# Patient Record
Sex: Female | Born: 1935
Health system: Southern US, Community
[De-identification: ages and names within clinical notes are randomized; demographics above are authoritative.]

## PROBLEM LIST (undated history)

## (undated) DIAGNOSIS — N289 Disorder of kidney and ureter, unspecified: Secondary | ICD-10-CM

## (undated) DIAGNOSIS — I10 Essential (primary) hypertension: Secondary | ICD-10-CM

## (undated) DIAGNOSIS — M1990 Unspecified osteoarthritis, unspecified site: Secondary | ICD-10-CM

## (undated) DIAGNOSIS — E871 Hypo-osmolality and hyponatremia: Secondary | ICD-10-CM

## (undated) DIAGNOSIS — E222 Syndrome of inappropriate secretion of antidiuretic hormone: Secondary | ICD-10-CM

## (undated) DIAGNOSIS — K5792 Diverticulitis of intestine, part unspecified, without perforation or abscess without bleeding: Secondary | ICD-10-CM

## (undated) DIAGNOSIS — R1310 Dysphagia, unspecified: Secondary | ICD-10-CM

## (undated) DIAGNOSIS — C801 Malignant (primary) neoplasm, unspecified: Secondary | ICD-10-CM

## (undated) DIAGNOSIS — E039 Hypothyroidism, unspecified: Secondary | ICD-10-CM

## (undated) HISTORY — DX: Disorder of kidney and ureter, unspecified: N28.9

## (undated) HISTORY — DX: Essential (primary) hypertension: I10

## (undated) HISTORY — PX: EYE SURGERY: SHX253

## (undated) HISTORY — DX: Hypothyroidism, unspecified: E03.9

## (undated) HISTORY — PX: TONSILLECTOMY: SUR1361

## (undated) HISTORY — PX: REFRACTIVE SURGERY: SHX103

## (undated) HISTORY — DX: Hypo-osmolality and hyponatremia: E87.1

---

## 2007-12-02 ENCOUNTER — Ambulatory Visit: Payer: Self-pay | Admitting: Internal Medicine

## 2007-12-15 ENCOUNTER — Ambulatory Visit: Payer: Self-pay | Admitting: Internal Medicine

## 2007-12-27 ENCOUNTER — Ambulatory Visit: Payer: Self-pay | Admitting: Internal Medicine

## 2008-01-19 ENCOUNTER — Ambulatory Visit: Payer: Self-pay | Admitting: Internal Medicine

## 2008-03-30 ENCOUNTER — Ambulatory Visit: Payer: Self-pay | Admitting: Internal Medicine

## 2008-12-04 ENCOUNTER — Ambulatory Visit: Payer: Self-pay | Admitting: Internal Medicine

## 2009-01-01 ENCOUNTER — Ambulatory Visit: Payer: Self-pay | Admitting: Internal Medicine

## 2009-03-07 ENCOUNTER — Ambulatory Visit: Payer: Self-pay | Admitting: Internal Medicine

## 2010-01-16 ENCOUNTER — Ambulatory Visit: Payer: Self-pay | Admitting: Internal Medicine

## 2011-01-19 ENCOUNTER — Ambulatory Visit: Payer: Self-pay | Admitting: Internal Medicine

## 2011-09-01 ENCOUNTER — Other Ambulatory Visit: Payer: Self-pay | Admitting: Internal Medicine

## 2011-09-01 MED ORDER — LEVOTHYROXINE SODIUM 88 MCG PO TABS: 88.0000 ug | ORAL_TABLET | Freq: Every day | ORAL | Status: DC

## 2011-09-29 ENCOUNTER — Other Ambulatory Visit: Payer: Self-pay | Admitting: Internal Medicine

## 2011-09-29 MED ORDER — SIMVASTATIN 40 MG PO TABS: 40.0000 mg | ORAL_TABLET | Freq: Every day | ORAL | Status: DC

## 2011-10-19 ENCOUNTER — Other Ambulatory Visit: Payer: Self-pay | Admitting: *Deleted

## 2011-10-19 MED ORDER — LISINOPRIL 40 MG PO TABS: 40.0000 mg | ORAL_TABLET | Freq: Two times a day (BID) | ORAL | Status: DC

## 2011-12-30 ENCOUNTER — Other Ambulatory Visit: Payer: Self-pay | Admitting: *Deleted

## 2011-12-31 MED ORDER — LEVOTHYROXINE SODIUM 88 MCG PO TABS: 88.0000 ug | ORAL_TABLET | Freq: Every day | ORAL | Status: DC

## 2011-12-31 MED ORDER — LOSARTAN POTASSIUM 100 MG PO TABS: 100.0000 mg | ORAL_TABLET | Freq: Every day | ORAL | Status: DC

## 2012-01-15 ENCOUNTER — Ambulatory Visit: Payer: Self-pay | Admitting: Internal Medicine

## 2012-01-22 ENCOUNTER — Ambulatory Visit (INDEPENDENT_AMBULATORY_CARE_PROVIDER_SITE_OTHER): Payer: Medicare Other | Admitting: Internal Medicine

## 2012-01-22 ENCOUNTER — Encounter: Payer: Self-pay | Admitting: Internal Medicine

## 2012-01-22 DIAGNOSIS — E785 Hyperlipidemia, unspecified: Secondary | ICD-10-CM

## 2012-01-22 DIAGNOSIS — Z79899 Other long term (current) drug therapy: Secondary | ICD-10-CM | POA: Diagnosis not present

## 2012-01-22 DIAGNOSIS — I10 Essential (primary) hypertension: Secondary | ICD-10-CM | POA: Diagnosis not present

## 2012-01-22 DIAGNOSIS — E039 Hypothyroidism, unspecified: Secondary | ICD-10-CM | POA: Diagnosis not present

## 2012-01-22 DIAGNOSIS — R030 Elevated blood-pressure reading, without diagnosis of hypertension: Secondary | ICD-10-CM | POA: Insufficient documentation

## 2012-01-22 LAB — COMPREHENSIVE METABOLIC PANEL
ALT: 19 U/L (ref 0–35)
Albumin: 4.2 g/dL (ref 3.5–5.2)
CO2: 25 mEq/L (ref 19–32)
Calcium: 9.2 mg/dL (ref 8.4–10.5)
Chloride: 95 mEq/L — ABNORMAL LOW (ref 96–112)
GFR: 73.42 mL/min (ref >60.00)
Potassium: 4.3 mEq/L (ref 3.5–5.1)
Sodium: 130 mEq/L — ABNORMAL LOW (ref 135–145)
Total Protein: 7.7 g/dL (ref 6.0–8.3)

## 2012-01-22 LAB — LIPID PANEL
Total CHOL/HDL Ratio: 2
Triglycerides: 73 mg/dL (ref 0.0–149.0)

## 2012-01-22 NOTE — Patient Instructions (Addendum)
Increase your vitamin d 3 to 2000 units daily during the winter months   Finish your current niaspan bottle but then stop.  We will repeat in 6 months

## 2012-01-22 NOTE — Assessment & Plan Note (Signed)
Her TSH is 0.98 which is at goal on 88 mcg of Synthroid daily. No changes today.

## 2012-01-22 NOTE — Progress Notes (Signed)
Subjective:    Patient ID: Brittney Jackson, female    DOB: 1929-12-30, 78 y.o.   MRN: [id number]  HPI Brittney Jackson is a 78 year old white female with a history of hypertension, hyperlipidemia, hypothyroidisim and osteoporosis with no prior fractures who is here to followup on general medical issues she has had persistent left hip pain for nearly 8 months which she acquired after walking long distance during pregnancy graduation ceremony. Her hip was evaluated by orthopedics and she chose to follow up with an Brittney Jackson and after several  months of therapy has completely resolved her pain. She continues to go to [facility] for monthly followup. She has resumed attending Brittney Jackson's arthritis classy and has no recent history of falls or balance problems. She continues to check her blood pressure at home and her readings have been consistently 110-135 systolic range and her pulse is in the 70s to 80s.  Past Medical History  Diagnosis Date  . Osteoporosis   . Hypertension   . Hypothyroidism    Current Outpatient Prescriptions on File Prior to Visit  Medication Sig Dispense Refill  . levothyroxine (SYNTHROID) 88 MCG tablet Take 1 tablet (88 mcg total) by mouth daily.  30 tablet  3  . lisinopril (PRINIVIL,ZESTRIL) 40 MG tablet Take 1 tablet (40 mg total) by mouth 2 (two) times daily.  60 tablet  6  . losartan (COZAAR) 100 MG tablet Take 1 tablet (100 mg total) by mouth daily.  30 tablet  0  . simvastatin (ZOCOR) 40 MG tablet Take 1 tablet (40 mg total) by mouth at bedtime.  30 tablet  3   Review of Systems  Constitutional: Negative for fever, chills and unexpected weight change.  HENT: Negative for hearing loss, ear pain, nosebleeds, congestion, sore throat, facial swelling, rhinorrhea, sneezing, mouth sores, trouble swallowing, neck pain, neck stiffness, voice change, postnasal drip, sinus pressure, tinnitus and ear discharge.   Eyes: Negative for pain, discharge,  redness and visual disturbance.  Respiratory: Negative for cough, chest tightness, shortness of breath, wheezing and stridor.   Cardiovascular: Negative for chest pain, palpitations and leg swelling.  Musculoskeletal: Negative for myalgias and arthralgias.  Skin: Negative for color change and rash.  Neurological: Negative for dizziness, weakness, light-headedness and headaches.  Hematological: Negative for adenopathy.       Objective:   Physical Exam  Constitutional: She is oriented to person, place, and time. She appears well-developed and well-nourished.  HENT:  Mouth/Throat: Oropharynx is clear and moist.  Eyes: EOM are normal. Pupils are equal, round, and reactive to light. No scleral icterus.  Neck: Normal range of motion. Neck supple. No JVD present. No thyromegaly present.  Cardiovascular: Normal rate, regular rhythm, normal heart sounds and intact distal pulses.   Pulmonary/Chest: Effort normal and breath sounds normal.  Abdominal: Soft. Bowel sounds are normal. She exhibits no mass. There is no tenderness.  Musculoskeletal: Normal range of motion. She exhibits no edema.  Lymphadenopathy:    She has no cervical adenopathy.  Neurological: She is alert and oriented to person, place, and time.  Skin: Skin is warm and dry.  Psychiatric: She has a normal mood and affect.       Assessment & Plan:   Hypertension She has a history of whitecoat hypertension and has consistently elevated pressures in the office but consistently normal pressures at home. She is no evidence of end organ damage by prior testing of urine and kidney function will continue Cozaar and lisinopril  despite presence of a mild cough, given her history of intolerance to hydrochlorothiazide because of hyponatremia and leukopenia which caused fluid retention. She is due for repeat to be met.  Hypothyroidism Her TSH is 0.98 which is at goal on 88 mcg of Synthroid daily. No changes today.  Hyperlipidemia LDL goal <  100 Her LDL is well under 70 on current dose of this simvastatin. She's also taking Niaspan we've decided to stop the Niaspan and repeat her lipids in 6 months.    Updated Medication List Outpatient Encounter Prescriptions as of 01/22/2012  Medication Sig Dispense Refill  . aspirin 81 MG tablet Take 81 mg by mouth daily.      . cholecalciferol (VITAMIN D) 1000 UNITS tablet Take 1,000 Units by mouth daily.      Marland Kitchen levothyroxine (SYNTHROID) 88 MCG tablet Take 1 tablet (88 mcg total) by mouth daily.  30 tablet  3  . lisinopril (PRINIVIL,ZESTRIL) 40 MG tablet Take 1 tablet (40 mg total) by mouth 2 (two) times daily.  60 tablet  6  . losartan (COZAAR) 100 MG tablet Take 1 tablet (100 mg total) by mouth daily.  30 tablet  0  . niacin (NIASPAN) 500 MG CR tablet Take 500 mg by mouth at bedtime.      . simvastatin (ZOCOR) 40 MG tablet Take 1 tablet (40 mg total) by mouth at bedtime.  30 tablet  3  . Calcium-Vitamin D-Vitamin K (VIACTIV PO) One by mouth three times a day.

## 2012-01-22 NOTE — Assessment & Plan Note (Signed)
Her LDL is well under 70 on current dose of this simvastatin. She's also taking Niaspan we've decided to stop the Niaspan and repeat her lipids in 6 months.

## 2012-01-22 NOTE — Assessment & Plan Note (Signed)
She has a history of whitecoat hypertension and has consistently elevated pressures in the office but consistently normal pressures at home. She is no evidence of end organ damage by prior testing of urine and kidney function will continue Cozaar and lisinopril despite presence of a mild cough, given her history of intolerance to hydrochlorothiazide because of hyponatremia and leukopenia which caused fluid retention. She is due for repeat to be met.

## 2012-01-25 ENCOUNTER — Telehealth: Payer: Self-pay | Admitting: Internal Medicine

## 2012-01-25 NOTE — Telephone Encounter (Signed)
Patient is scheduled on March 14,2013 for mammogram @ 3:15 Bone DX @ 3:00. Faxed order to [phone].

## 2012-01-28 ENCOUNTER — Other Ambulatory Visit: Payer: Self-pay | Admitting: *Deleted

## 2012-01-28 MED ORDER — LOSARTAN POTASSIUM 100 MG PO TABS: 100.0000 mg | ORAL_TABLET | Freq: Every day | ORAL | Status: DC

## 2012-01-28 MED ORDER — SIMVASTATIN 40 MG PO TABS: 40.0000 mg | ORAL_TABLET | Freq: Every day | ORAL | Status: DC

## 2012-01-28 NOTE — Telephone Encounter (Signed)
Faxed request from [facility], last filled 12/28/11.

## 2012-03-03 ENCOUNTER — Encounter: Payer: Self-pay | Admitting: Internal Medicine

## 2012-04-20 ENCOUNTER — Ambulatory Visit: Payer: Self-pay | Admitting: Internal Medicine

## 2012-04-20 DIAGNOSIS — M899 Disorder of bone, unspecified: Secondary | ICD-10-CM | POA: Diagnosis not present

## 2012-04-20 DIAGNOSIS — Z87898 Personal history of other specified conditions: Secondary | ICD-10-CM | POA: Diagnosis not present

## 2012-04-20 DIAGNOSIS — Z1231 Encounter for screening mammogram for malignant neoplasm of breast: Secondary | ICD-10-CM | POA: Diagnosis not present

## 2012-04-20 DIAGNOSIS — Z79899 Other long term (current) drug therapy: Secondary | ICD-10-CM | POA: Diagnosis not present

## 2012-04-23 LAB — HM MAMMOGRAPHY: HM Mammogram: NORMAL

## 2012-04-23 LAB — HM DEXA SCAN

## 2012-04-25 DIAGNOSIS — M169 Osteoarthritis of hip, unspecified: Secondary | ICD-10-CM | POA: Diagnosis not present

## 2012-04-25 DIAGNOSIS — M1710 Unilateral primary osteoarthritis, unspecified knee: Secondary | ICD-10-CM | POA: Diagnosis not present

## 2012-04-25 DIAGNOSIS — M545 Low back pain: Secondary | ICD-10-CM | POA: Diagnosis not present

## 2012-04-26 ENCOUNTER — Telehealth: Payer: Self-pay | Admitting: Internal Medicine

## 2012-04-26 DIAGNOSIS — M8580 Other specified disorders of bone density and structure, unspecified site: Secondary | ICD-10-CM | POA: Insufficient documentation

## 2012-04-26 MED ORDER — LEVOTHYROXINE SODIUM 88 MCG PO TABS: 88.0000 ug | ORAL_TABLET | Freq: Every day | ORAL | Status: DC

## 2012-04-26 MED ORDER — LISINOPRIL 40 MG PO TABS: 40.0000 mg | ORAL_TABLET | Freq: Two times a day (BID) | ORAL | Status: DC

## 2012-04-26 NOTE — Telephone Encounter (Signed)
Bone density scores have remained the same or in some places improved, since 2008.

## 2012-04-26 NOTE — Telephone Encounter (Signed)
Patient notified

## 2012-05-04 ENCOUNTER — Encounter: Payer: Self-pay | Admitting: Internal Medicine

## 2012-05-06 ENCOUNTER — Encounter: Payer: Self-pay | Admitting: Internal Medicine

## 2012-07-21 ENCOUNTER — Ambulatory Visit (INDEPENDENT_AMBULATORY_CARE_PROVIDER_SITE_OTHER): Payer: Medicare Other | Admitting: Internal Medicine

## 2012-07-21 ENCOUNTER — Encounter: Payer: Self-pay | Admitting: Internal Medicine

## 2012-07-21 VITALS — BP 180/80 | HR 84 | Temp 98.4°F | Resp 16 | Wt 145.5 lb

## 2012-07-21 DIAGNOSIS — E039 Hypothyroidism, unspecified: Secondary | ICD-10-CM | POA: Diagnosis not present

## 2012-07-21 DIAGNOSIS — M899 Disorder of bone, unspecified: Secondary | ICD-10-CM | POA: Diagnosis not present

## 2012-07-21 DIAGNOSIS — Z79899 Other long term (current) drug therapy: Secondary | ICD-10-CM | POA: Diagnosis not present

## 2012-07-21 DIAGNOSIS — I10 Essential (primary) hypertension: Secondary | ICD-10-CM | POA: Diagnosis not present

## 2012-07-21 DIAGNOSIS — M8580 Other specified disorders of bone density and structure, unspecified site: Secondary | ICD-10-CM

## 2012-07-21 DIAGNOSIS — E871 Hypo-osmolality and hyponatremia: Secondary | ICD-10-CM

## 2012-07-21 LAB — COMPREHENSIVE METABOLIC PANEL
Alkaline Phosphatase: 131 U/L — ABNORMAL HIGH (ref 39–117)
BUN: 13 mg/dL (ref 6–23)
CO2: 25 mEq/L (ref 19–32)
GFR: 65.69 mL/min (ref >60.00)
Glucose, Bld: 105 mg/dL — ABNORMAL HIGH (ref 70–99)
Sodium: 130 mEq/L — ABNORMAL LOW (ref 135–145)
Total Bilirubin: 0.6 mg/dL (ref 0.3–1.2)
Total Protein: 7.3 g/dL (ref 6.0–8.3)

## 2012-07-21 NOTE — Assessment & Plan Note (Addendum)
Stable, by repeat 2013 DEXA compared to 2008 with T scores -2.3 worst at hips.  She does not want therapy.,  Taking calcium and vitamin d

## 2012-07-21 NOTE — Progress Notes (Signed)
Patient ID: Brittney Jackson, female   DOB: 1930/09/28, 78 y.o.   MRN: [id number] Patient Active Problem List  Diagnosis  . Hypertension  . Hypothyroidism  . Hyperlipidemia LDL goal < 100  . Osteopenia    Subjective:  CC:   Chief Complaint  Patient presents with  . Follow-up    6 month    HPI:   Brittney Jackson a 78 y.o. female who presents for follow up on chronic issues of hypertension and osteoarthritis.  She has been having increased left knee and left SI joint pain which was treated recently with cortisone injection in the knee by Pilar Jarvis at [facility] in April with good results. She is walking for exercise. Not taking any medications for pain. Decided at addition of a pressure her home readings are excellent systolics have been ranging from 120-135.   She continues to have 15 minutes of r unny nose every morning after taking her meds. She has a Mild cough secondary to lisinopril but doesn't want to change to other meds. Because of multiple intolerances.  She has done recently that use of ibuprofen elevates her blood pressure  She is using tylenol for good pain management.    Past Medical History  Diagnosis Date  . Osteoporosis   . Hypertension   . Hypothyroidism     History reviewed. No pertinent past surgical history.  The following portions of the patient's history were reviewed and updated as appropriate: Allergies, current medications, and problem list.    Review of Systems:   Review of Systems  Constitutional: Negative.   HENT: Negative.   Eyes: Negative.   Cardiovascular: Negative.   Gastrointestinal: Negative.   Genitourinary: Negative.   Musculoskeletal: Positive for joint pain.  Skin: Negative.   Neurological: Negative.   Endo/Heme/Allergies: Negative.   Psychiatric/Behavioral: Negative.        History   Social History  . Marital Status: Widowed    Spouse Name: N/A    Number of Children: N/A  . Years of Education: N/A   Occupational  History  . 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 Smoking status: Former Smoker    Types: Cigarettes    Quit date: 07/21/2000  . Smokeless tobacco: Never Used  . Alcohol Use: No  . Drug Use: No  . Sexually Active: Not on file   Other Topics Concern  . Not on file   Social History Narrative   Church activities, community service.Lives alone.    Objective:  BP 180/80  Pulse 84  Temp 98.4 F (36.9 C) (Oral)  Resp 16  Wt 145 lb 8 oz (65.998 kg)  SpO2 95%  General appearance: alert, cooperative and appears stated age Ears: normal TM's and external ear canals both ears Throat: lips, mucosa, and tongue normal; teeth and gums normal Neck: no adenopathy, no carotid bruit, supple, symmetrical, trachea midline and thyroid not enlarged, symmetric, no tenderness/mass/nodules Back: symmetric, no curvature. ROM normal. No CVA tenderness. Lungs: clear to auscultation bilaterally Heart: regular rate and rhythm, S1, S2 normal, no murmur, click, rub or gallop Abdomen: soft, non-tender; bowel sounds normal; no masses,  no organomegaly Pulses: 2+ and symmetric Skin: Skin color, texture, turgor normal. No rashes or lesions Lymph nodes: Cervical, supraclavicular, and axillary nodes normal.  Assessment and Plan:  Osteopenia Stable, by repeat 2013 DEXA compared to 2008 with T scores -2.3 worst at hips.  She does not want therapy.,  Taking calcium and vitamin  d   Hypertension Well controlled on current regimen. Renal function stable, no changes today.  Hypothyroidism Well controlled on current medications.  No changes today.  Hyponatremia Chronic, stable, asymptomatic.  Avoiding hctz ans this aggravated it in the past.    Updated Medication List Outpatient Encounter Prescriptions as of 07/21/2012  Medication Sig Dispense Refill  . aspirin 81 MG tablet Take 81 mg by mouth daily.      . cholecalciferol (VITAMIN D) 1000 UNITS tablet Take  1,000 Units by mouth daily.      Marland Kitchen levothyroxine (SYNTHROID) 88 MCG tablet Take 1 tablet (88 mcg total) by mouth daily.  30 tablet  3  . lisinopril (PRINIVIL,ZESTRIL) 40 MG tablet Take 1 tablet (40 mg total) by mouth 2 (two) times daily.  60 tablet  6  . losartan (COZAAR) 100 MG tablet Take 1 tablet (100 mg total) by mouth daily.  30 tablet  11  . simvastatin (ZOCOR) 40 MG tablet Take 1 tablet (40 mg total) by mouth at bedtime.  30 tablet  6  . DISCONTD: Calcium-Vitamin D-Vitamin K (VIACTIV PO) One by mouth three times a day.      Marland Kitchen DISCONTD: niacin (NIASPAN) 500 MG CR tablet Take 500 mg by mouth at bedtime.

## 2012-07-22 ENCOUNTER — Encounter: Payer: Self-pay | Admitting: Internal Medicine

## 2012-07-23 ENCOUNTER — Encounter: Payer: Self-pay | Admitting: Internal Medicine

## 2012-07-23 DIAGNOSIS — E871 Hypo-osmolality and hyponatremia: Secondary | ICD-10-CM | POA: Insufficient documentation

## 2012-07-23 NOTE — Assessment & Plan Note (Signed)
Chronic, stable, asymptomatic.  Avoiding hctz ans this aggravated it in the past.

## 2012-07-23 NOTE — Assessment & Plan Note (Signed)
Well controlled on current medications.  No changes today. 

## 2012-07-23 NOTE — Assessment & Plan Note (Signed)
Well controlled on current regimen. Renal function stable, no changes today. 

## 2012-08-24 ENCOUNTER — Other Ambulatory Visit: Payer: Self-pay | Admitting: Internal Medicine

## 2012-08-24 MED ORDER — SIMVASTATIN 40 MG PO TABS: 40.0000 mg | ORAL_TABLET | Freq: Every day | ORAL | Status: DC

## 2012-08-24 MED ORDER — LEVOTHYROXINE SODIUM 88 MCG PO TABS: 88.0000 ug | ORAL_TABLET | Freq: Every day | ORAL | Status: DC

## 2012-08-25 DIAGNOSIS — H25099 Other age-related incipient cataract, unspecified eye: Secondary | ICD-10-CM | POA: Diagnosis not present

## 2013-01-24 ENCOUNTER — Encounter: Payer: Self-pay | Admitting: Internal Medicine

## 2013-01-24 ENCOUNTER — Ambulatory Visit (INDEPENDENT_AMBULATORY_CARE_PROVIDER_SITE_OTHER): Payer: Medicare Other | Admitting: Internal Medicine

## 2013-01-24 VITALS — BP 150/80 | HR 91 | Temp 98.0°F | Resp 16 | Ht 60.0 in | Wt 144.2 lb

## 2013-01-24 DIAGNOSIS — E871 Hypo-osmolality and hyponatremia: Secondary | ICD-10-CM

## 2013-01-24 DIAGNOSIS — Z23 Encounter for immunization: Secondary | ICD-10-CM

## 2013-01-24 DIAGNOSIS — E785 Hyperlipidemia, unspecified: Secondary | ICD-10-CM

## 2013-01-24 DIAGNOSIS — I10 Essential (primary) hypertension: Secondary | ICD-10-CM | POA: Diagnosis not present

## 2013-01-24 DIAGNOSIS — E039 Hypothyroidism, unspecified: Secondary | ICD-10-CM

## 2013-01-24 DIAGNOSIS — M949 Disorder of cartilage, unspecified: Secondary | ICD-10-CM

## 2013-01-24 DIAGNOSIS — Z79899 Other long term (current) drug therapy: Secondary | ICD-10-CM | POA: Diagnosis not present

## 2013-01-24 DIAGNOSIS — Z0000 Encounter for general adult medical examination without abnormal findings: Secondary | ICD-10-CM

## 2013-01-24 DIAGNOSIS — M899 Disorder of bone, unspecified: Secondary | ICD-10-CM | POA: Diagnosis not present

## 2013-01-24 DIAGNOSIS — M8580 Other specified disorders of bone density and structure, unspecified site: Secondary | ICD-10-CM

## 2013-01-24 LAB — COMPREHENSIVE METABOLIC PANEL
ALT: 20 U/L (ref 0–35)
AST: 22 U/L (ref 0–37)
Albumin: 4.1 g/dL (ref 3.5–5.2)
Alkaline Phosphatase: 126 U/L — ABNORMAL HIGH (ref 39–117)
BUN: 22 mg/dL (ref 6–23)
Creatinine, Ser: 1 mg/dL (ref 0.4–1.2)
Potassium: 3.6 mEq/L (ref 3.5–5.1)

## 2013-01-24 LAB — TSH: TSH: 0.79 u[IU]/mL (ref 0.35–5.50)

## 2013-01-24 MED ORDER — TETANUS-DIPHTH-ACELL PERTUSSIS 5-2.5-18.5 LF-MCG/0.5 IM SUSP: 0.5000 mL | Freq: Once | INTRAMUSCULAR | Status: DC

## 2013-01-24 MED ORDER — PNEUMOCOCCAL VAC POLYVALENT 25 MCG/0.5ML IJ INJ: 0.5000 mL | INJECTION | Freq: Once | INTRAMUSCULAR | Status: DC

## 2013-01-24 MED ORDER — ZOSTER VACCINE LIVE 19400 UNT/0.65ML ~~LOC~~ SOLR: 0.6500 mL | Freq: Once | SUBCUTANEOUS | Status: DC

## 2013-01-24 NOTE — Assessment & Plan Note (Signed)
Comp exam with breast exam done. Pelvic deferred as it was done last year .

## 2013-01-24 NOTE — Assessment & Plan Note (Addendum)
t scores stable  Since 2008 by April 2013 DEXA.Marland Kitchen  Discussed and reviewed today. Does not want meds.  Had a prior trial of alendronate.  Physically activity and takes classes for mobility and strength .

## 2013-01-24 NOTE — Progress Notes (Signed)
Patient ID: Brittney Jackson, female   DOB: Jun 10, 1930, 79 y.o.   MRN: [id number]  The patient is here for annual Medicare wellness examination and management of other chronic and acute problems. ollow up on chronic issues of hypertension and osteo arthritis.  She is walking for exercise. Not taking any medications for pain. Blood pressure home readings are excellent systolics have been ranging from 120-135.   She continues to have 15 minutes of runny nose every morning after taking her meds. She has a mild cough secondary to lisinopril but doesn't want to change to other meds. Because of multiple intolerances.  She made the observation that use of ibuprofen elevates her blood pressure and is using tylenol for good pain management.     The risk factors are reflected in the social history.  The roster of all physicians providing medical care to patient - is listed in the Snapshot section of the chart.  Activities of daily living:  The patient is 100% independent in all ADLs: dressing, toileting, feeding as well as independent mobility  Home safety : The patient has smoke detectors in the home. They wear seatbelts.  There are no firearms at home. There is no violence in the home.   There is no risks for hepatitis, STDs or HIV. There is no   history of blood transfusion. They have no travel history to infectious disease endemic areas of the world.  The patient has seen their dentist in the last six month. They have seen their eye doctor in the last year. They admit to slight hearing difficulty with regard to whispered voices and some television programs.  They have deferred audiologic testing in the last year.  They do not  have excessive sun exposure. Discussed the need for sun protection: hats, long sleeves and use of sunscreen if there is significant sun exposure.   Diet: the importance of a healthy diet is discussed. They do have a healthy diet.  The benefits of regular aerobic exercise were  discussed. She walks 5 times per week  On a treadmill ,  20 minutes.  Sees acupuncturist ,  Dr Clarice Pole for treatment of  left knee .  Last Fall was Feb 2013 when knee gave out.   Depression screen: there are no signs or vegetative symptoms of depression- irritability, change in appetite, anhedonia, sadness/tearfullness. Has upsetting anxiety dreams/nightmares a few nights per week.   Cognitive assessment: the patient manages all their financial and personal affairs and is actively engaged. They could relate day,date,year and events; recalled 2/3 objects at 3 minutes; performed clock-face test normally.  The following portions of the patient's history were reviewed and updated as appropriate: allergies, current medications, past family history, past medical history,  past surgical history, past social history  and problem list.  Visual acuity was not assessed per patient preference since she has regular follow up with her ophthalmologist. Hearing and body mass index were assessed and reviewed.   During the course of the visit the patient was educated and counseled about appropriate screening and preventive services including : fall prevention , diabetes screening, nutrition counseling, colorectal cancer screening, and recommended immunizations.    BP 150/80  Pulse 91  Temp 98 F (36.7 C) (Oral)  Resp 16  Ht 5' (1.524 m)  Wt 144 lb 4 oz (65.431 kg)  BMI 28.17 kg/m2  SpO2 96%  General Appearance:    Alert, cooperative, no distress, appears stated age  Head:    Normocephalic, without obvious abnormality,  atraumatic  Eyes:    PERRL, conjunctiva/corneas clear, EOM's intact, fundi    benign, both eyes  Ears:    Normal TM's and external ear canals, both ears  Nose:   Nares normal, septum midline, mucosa normal, no drainage    or sinus tenderness  Throat:   Lips, mucosa, and tongue normal; teeth and gums normal  Neck:   Supple, symmetrical, trachea midline, no adenopathy;    thyroid:  no  enlargement/tenderness/nodules; no carotid   bruit or JVD  Back:     Symmetric, no curvature, ROM normal, no CVA tenderness  Lungs:     Clear to auscultation bilaterally, respirations unlabored  Chest Wall:    No tenderness or deformity   Heart:    Regular rate and rhythm, S1 and S2 normal, no murmur, rub   or gallop  Breast Exam:    No tenderness, masses, or nipple abnormality  Abdomen:     Soft, non-tender, bowel sounds active all four quadrants,    no masses, no organomegaly     Extremities:   Extremities normal, atraumatic, no cyanosis or edema  Pulses:   2+ and symmetric all extremities  Skin:   Skin color, texture, turgor normal, no rashes or lesions  Lymph nodes:   Cervical, supraclavicular, and axillary nodes normal  Neurologic:   CNII-XII intact, normal strength, sensation and reflexes    throughout   Assessment and Plan  Osteopenia t scores stable  Since 2008 by April 2013 DEXA.Marland Kitchen  Discussed and reviewed today. Does not want meds.  Had a prior trial of alendronate.  Physically activity and takes classes for mobility and strength .    Hypertension Well controlled on current medications.  No changes today. Home systolics have been 130/70  Routine general medical examination at a health care facility Comp exam with breast exam done. Pelvic deferred as it was done last year .  Hyponatremia Stable, asymptomatic.   Hypothyroidism Thyroid function is normal on current dose. No changes today   Hyperlipidemia LDL goal < 100 LDL is at goal on current medications.  LFTS are normal. Repeat both in 6 months    Updated Medication List Outpatient Encounter Prescriptions as of 01/24/2013  Medication Sig Dispense Refill  . aspirin 81 MG tablet Take 81 mg by mouth daily.      . cholecalciferol (VITAMIN D) 1000 UNITS tablet Take 1,000 Units by mouth daily.      Marland Kitchen levothyroxine (SYNTHROID) 88 MCG tablet Take 1 tablet (88 mcg total) by mouth daily.  90 tablet  3  . lisinopril  (PRINIVIL,ZESTRIL) 40 MG tablet Take 1 tablet (40 mg total) by mouth 2 (two) times daily.  60 tablet  6  . losartan (COZAAR) 100 MG tablet Take 1 tablet (100 mg total) by mouth daily.  30 tablet  11  . simvastatin (ZOCOR) 40 MG tablet Take 1 tablet (40 mg total) by mouth at bedtime.  90 tablet  3  . TDaP (BOOSTRIX) 5-2.5-18.5 LF-MCG/0.5 injection Inject 0.5 mLs into the muscle once.  0.5 mL  0  . zoster vaccine live, PF, (ZOSTAVAX) 16109 UNT/0.65ML injection Inject 19,400 Units into the skin once.  1 vial  0   Facility-Administered Encounter Medications as of 01/24/2013  Medication Dose Route Frequency Provider Last Rate Last Dose  . pneumococcal 23 valent vaccine (PNU-IMMUNE) injection 0.5 mL  0.5 mL Intramuscular Once Sherlene Shams, MD

## 2013-01-24 NOTE — Assessment & Plan Note (Addendum)
Well controlled on current medications.  No changes today. Home systolics have been 130/70

## 2013-01-24 NOTE — Patient Instructions (Signed)
I recommend that you get the Pneumonia vaccine and the TDaP vaccine  Consider getting the Shingles vaccine   Return in 6 months  You are due for mammogram in April

## 2013-01-25 NOTE — Assessment & Plan Note (Signed)
Stable, asymptomatic. 

## 2013-01-25 NOTE — Assessment & Plan Note (Signed)
LDL is at goal on current medications.  LFTS are normal. Repeat both in 6 months

## 2013-01-25 NOTE — Assessment & Plan Note (Signed)
Thyroid function is normal on current dose. No changes today  

## 2013-02-16 ENCOUNTER — Telehealth: Payer: Self-pay | Admitting: *Deleted

## 2013-02-16 MED ORDER — LOSARTAN POTASSIUM 100 MG PO TABS: 100.0000 mg | ORAL_TABLET | Freq: Every day | ORAL | Status: DC

## 2013-02-16 NOTE — Telephone Encounter (Signed)
Med filled.  

## 2013-02-16 NOTE — Telephone Encounter (Signed)
Refill Request  Losartan Potassium 100 mg tab  #30  Take 1 tablet each day

## 2013-03-10 ENCOUNTER — Other Ambulatory Visit: Payer: Self-pay | Admitting: *Deleted

## 2013-03-10 MED ORDER — LISINOPRIL 40 MG PO TABS: 40.0000 mg | ORAL_TABLET | Freq: Two times a day (BID) | ORAL | Status: DC

## 2013-03-10 NOTE — Telephone Encounter (Signed)
Med filled.  

## 2013-04-28 LAB — HM MAMMOGRAPHY: HM Mammogram: NORMAL

## 2013-05-01 ENCOUNTER — Ambulatory Visit: Payer: Self-pay | Admitting: Internal Medicine

## 2013-05-01 DIAGNOSIS — R920 Mammographic microcalcification found on diagnostic imaging of breast: Secondary | ICD-10-CM | POA: Diagnosis not present

## 2013-05-01 DIAGNOSIS — Z1231 Encounter for screening mammogram for malignant neoplasm of breast: Secondary | ICD-10-CM | POA: Diagnosis not present

## 2013-05-24 ENCOUNTER — Encounter: Payer: Self-pay | Admitting: Internal Medicine

## 2013-07-24 ENCOUNTER — Encounter: Payer: Self-pay | Admitting: Internal Medicine

## 2013-07-24 ENCOUNTER — Ambulatory Visit (INDEPENDENT_AMBULATORY_CARE_PROVIDER_SITE_OTHER): Payer: Medicare Other | Admitting: Internal Medicine

## 2013-07-24 VITALS — BP 158/78 | HR 93 | Temp 98.3°F | Resp 14 | Wt 143.5 lb

## 2013-07-24 DIAGNOSIS — E039 Hypothyroidism, unspecified: Secondary | ICD-10-CM

## 2013-07-24 DIAGNOSIS — M899 Disorder of bone, unspecified: Secondary | ICD-10-CM | POA: Diagnosis not present

## 2013-07-24 DIAGNOSIS — I10 Essential (primary) hypertension: Secondary | ICD-10-CM | POA: Diagnosis not present

## 2013-07-24 DIAGNOSIS — Z79899 Other long term (current) drug therapy: Secondary | ICD-10-CM | POA: Diagnosis not present

## 2013-07-24 DIAGNOSIS — E785 Hyperlipidemia, unspecified: Secondary | ICD-10-CM | POA: Diagnosis not present

## 2013-07-24 DIAGNOSIS — M949 Disorder of cartilage, unspecified: Secondary | ICD-10-CM | POA: Diagnosis not present

## 2013-07-24 DIAGNOSIS — M8580 Other specified disorders of bone density and structure, unspecified site: Secondary | ICD-10-CM

## 2013-07-24 NOTE — Progress Notes (Addendum)
Patient ID: Brittney Jackson, female   DOB: [birth year], 79 y.o.   MRN: [id number]   Patient Active Problem List   Diagnosis Date Noted  . Routine general medical examination at a health care facility 01/24/2013  . Hyponatremia 07/23/2012  . Osteopenia 04/26/2012  . Hypertension 01/22/2012  . Hypothyroidism 01/22/2012  . Hyperlipidemia LDL goal < 100 01/22/2012    Subjective:  CC:   Chief Complaint  Patient presents with  . Follow-up    6 month follow up    HPI:   Brittney Jackson a 79 y.o. female who presents for  6 month follow  Up on hypertension.  Patient continues to check her blood pressure to 3 times per week at home. She reports that her home bps have been well controlled,Ranging  from 127 to to 135 systolic with diastolics in the range of r 60 to 70. She is continuing to report a dry hacking cough with use of lisinopril but does not want to change her medications since she has a history of intolerances to other types.  She is up-to-date on Eye exams which have been negative for glaucoma and cataracts    Has not had the T DAP and Shingles,  Despite being counselled on it.  She has no contact with small children and does not do any yard work, and has no intention of getting it either vaccine at this time.   Past Medical History  Diagnosis Date  . Osteoporosis   . Hypertension   . Hypothyroidism     History reviewed. No pertinent past surgical history.  . pneumococcal 23 valent vaccine  0.5 mL Intramuscular Once     The following portions of the patient's history were reviewed and updated as appropriate: Allergies, current medications, and problem list.    Review of Systems:   Patient denies headache, fevers, malaise, unintentional weight loss, skin rash, eye pain, sinus congestion and sinus pain, sore throat, dysphagia,  hemoptysis , dyspnea, wheezing, chest pain, palpitations, orthopnea, edema, abdominal pain, nausea, melena, diarrhea, constipation, flank pain,  dysuria, hematuria, urinary  Frequency, nocturia, numbness, tingling, seizures,  Focal weakness, Loss of consciousness,  Tremor, insomnia, depression, anxiety, and suicidal ideation.     History   Social History  . Marital Status: Widowed    Spouse Name: N/A    Number of Children: N/A  . Years of Education: N/A   Occupational History  . 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 Smoking status: Former Smoker    Types: Cigarettes    Quit date: 07/21/2000  . Smokeless tobacco: Never Used  . Alcohol Use: No  . Drug Use: No  . Sexually Active: Not on file   Other Topics Concern  . Not on file   Social History Narrative   Church activities, community service.   Lives alone.    Objective:  BP 158/78  Pulse 93  Temp(Src) 98.3 F (36.8 C) (Oral)  Resp 14  Wt 143 lb 8 oz (65.091 kg)  BMI 28.03 kg/m2  SpO2 96%  General appearance: alert, cooperative and appears stated age Ears: normal TM's and external ear canals both ears Throat: lips, mucosa, and tongue normal; teeth and gums normal Neck: no adenopathy, no carotid bruit, supple, symmetrical, trachea midline and thyroid not enlarged, symmetric, no tenderness/mass/nodules Back: symmetric, no curvature. ROM normal. No CVA tenderness. Lungs: clear to auscultation bilaterally Heart: regular rate and rhythm, S1, S2 normal, no murmur, click, rub  or gallop Abdomen: soft, non-tender; bowel sounds normal; no masses,  no organomegaly Pulses: 2+ and symmetric Skin: Skin color, texture, turgor normal. No rashes or lesions Lymph nodes: Cervical, supraclavicular, and axillary nodes normal.  Assessment and Plan:  Osteopenia Taking vitamin d and exercising,  Not taking calcium  , took viactiv but did not like it.   Hypertension Well controlled on current regimen. Renal function needs checking and she has requested to have this done at [facility] due to a perceived lower  charge.  Hyperlipidemia LDL goal < 100 LDL is at goal on current medications.  LFTS are due. Repeat both in 6 months   Hypothyroidism Well-controlled on current dose of Synthroid.   Updated Medication List Outpatient Encounter Prescriptions as of 07/24/2013  Medication Sig Dispense Refill  . aspirin 81 MG tablet Take 81 mg by mouth daily.      . cholecalciferol (VITAMIN D) 1000 UNITS tablet Take 1,000 Units by mouth daily.      Marland Kitchen levothyroxine (SYNTHROID) 88 MCG tablet Take 1 tablet (88 mcg total) by mouth daily.  90 tablet  3  . lisinopril (PRINIVIL,ZESTRIL) 40 MG tablet Take 1 tablet (40 mg total) by mouth 2 (two) times daily.  60 tablet  6  . losartan (COZAAR) 100 MG tablet Take 1 tablet (100 mg total) by mouth daily.  90 tablet  2  . simvastatin (ZOCOR) 40 MG tablet Take 1 tablet (40 mg total) by mouth at bedtime.  90 tablet  3  . TDaP (BOOSTRIX) 5-2.5-18.5 LF-MCG/0.5 injection Inject 0.5 mLs into the muscle once.  0.5 mL  0  . zoster vaccine live, PF, (ZOSTAVAX) 62130 UNT/0.65ML injection Inject 19,400 Units into the skin once.  1 vial  0   Facility-Administered Encounter Medications as of 07/24/2013  Medication Dose Route Frequency Provider Last Rate Last Dose  . pneumococcal 23 valent vaccine (PNU-IMMUNE) injection 0.5 mL  0.5 mL Intramuscular Once Sherlene Shams, MD         No orders of the defined types were placed in this encounter.    No Follow-up on file.

## 2013-07-24 NOTE — Assessment & Plan Note (Signed)
Taking vitamin d and exercising,  Not taking calcium  , took viactiv but did not like it.

## 2013-07-24 NOTE — Patient Instructions (Addendum)
I recommend taking  One calcium supplement daily for your bones,    We will refill your medications  Once I see your labs.    Please get your liver and kidney function checked at [facility]   If the cough really gets to you,  We can switch to amlodipine and prn lasix

## 2013-07-25 ENCOUNTER — Encounter: Payer: Self-pay | Admitting: Internal Medicine

## 2013-07-25 NOTE — Assessment & Plan Note (Signed)
Well controlled on current dose of Synthroid.  

## 2013-07-25 NOTE — Assessment & Plan Note (Signed)
LDL is at goal on current medications.  LFTS are due. Repeat both in 6 months

## 2013-07-25 NOTE — Assessment & Plan Note (Signed)
Well controlled on current regimen. Renal function needs checking and she has requested to have this done at [facility] due to a perceived lower charge.

## 2013-07-26 ENCOUNTER — Telehealth: Payer: Self-pay | Admitting: Internal Medicine

## 2013-07-26 NOTE — Telephone Encounter (Signed)
All labs normal, contineu current medications

## 2013-07-27 ENCOUNTER — Encounter: Payer: Self-pay | Admitting: *Deleted

## 2013-07-27 NOTE — Telephone Encounter (Signed)
Letter mailed to patient.

## 2013-07-31 ENCOUNTER — Encounter: Payer: Self-pay | Admitting: Internal Medicine

## 2013-08-16 ENCOUNTER — Other Ambulatory Visit: Payer: Self-pay | Admitting: Internal Medicine

## 2013-09-09 DIAGNOSIS — H109 Unspecified conjunctivitis: Secondary | ICD-10-CM | POA: Diagnosis not present

## 2013-09-09 DIAGNOSIS — H00019 Hordeolum externum unspecified eye, unspecified eyelid: Secondary | ICD-10-CM | POA: Diagnosis not present

## 2013-09-12 DIAGNOSIS — H2510 Age-related nuclear cataract, unspecified eye: Secondary | ICD-10-CM | POA: Diagnosis not present

## 2013-10-18 DIAGNOSIS — Z23 Encounter for immunization: Secondary | ICD-10-CM | POA: Diagnosis not present

## 2013-11-15 ENCOUNTER — Other Ambulatory Visit: Payer: Self-pay | Admitting: Internal Medicine

## 2013-12-27 DIAGNOSIS — I10 Essential (primary) hypertension: Secondary | ICD-10-CM | POA: Diagnosis not present

## 2013-12-27 DIAGNOSIS — J209 Acute bronchitis, unspecified: Secondary | ICD-10-CM | POA: Diagnosis not present

## 2013-12-27 LAB — CBC
HGB: 14 g/dL (ref 12.0–16.0)
MCH: 28.8 pg (ref 26.0–34.0)
MCHC: 34.7 g/dL (ref 32.0–36.0)
Platelet: 258 10*3/uL (ref 150–440)

## 2013-12-27 LAB — COMPREHENSIVE METABOLIC PANEL
Albumin: 3.6 g/dL (ref 3.4–5.0)
Bilirubin,Total: 0.4 mg/dL (ref 0.2–1.0)
Calcium, Total: 8.5 mg/dL (ref 8.5–10.1)
Chloride: 87 mmol/L — ABNORMAL LOW (ref 98–107)
Creatinine: 0.68 mg/dL (ref 0.60–1.30)
EGFR (African American): >60
EGFR (Non-African Amer.): >60
Osmolality: 239 (ref 275–301)
SGOT(AST): 35 U/L (ref 15–37)
SGPT (ALT): 28 U/L (ref 12–78)
Sodium: 118 mmol/L — CL (ref 136–145)

## 2013-12-28 ENCOUNTER — Inpatient Hospital Stay: Payer: Self-pay | Admitting: Family Medicine

## 2013-12-28 DIAGNOSIS — J440 Chronic obstructive pulmonary disease with acute lower respiratory infection: Secondary | ICD-10-CM | POA: Diagnosis present

## 2013-12-28 DIAGNOSIS — L89109 Pressure ulcer of unspecified part of back, unspecified stage: Secondary | ICD-10-CM | POA: Diagnosis not present

## 2013-12-28 DIAGNOSIS — R5381 Other malaise: Secondary | ICD-10-CM | POA: Diagnosis present

## 2013-12-28 DIAGNOSIS — R918 Other nonspecific abnormal finding of lung field: Secondary | ICD-10-CM | POA: Diagnosis not present

## 2013-12-28 DIAGNOSIS — L8992 Pressure ulcer of unspecified site, stage 2: Secondary | ICD-10-CM | POA: Diagnosis present

## 2013-12-28 DIAGNOSIS — Z6827 Body mass index (BMI) 27.0-27.9, adult: Secondary | ICD-10-CM | POA: Diagnosis not present

## 2013-12-28 DIAGNOSIS — I10 Essential (primary) hypertension: Secondary | ICD-10-CM | POA: Diagnosis not present

## 2013-12-28 DIAGNOSIS — E559 Vitamin D deficiency, unspecified: Secondary | ICD-10-CM | POA: Diagnosis present

## 2013-12-28 DIAGNOSIS — E785 Hyperlipidemia, unspecified: Secondary | ICD-10-CM | POA: Diagnosis present

## 2013-12-28 DIAGNOSIS — E039 Hypothyroidism, unspecified: Secondary | ICD-10-CM | POA: Diagnosis not present

## 2013-12-28 DIAGNOSIS — R05 Cough: Secondary | ICD-10-CM | POA: Diagnosis not present

## 2013-12-28 DIAGNOSIS — I059 Rheumatic mitral valve disease, unspecified: Secondary | ICD-10-CM | POA: Diagnosis not present

## 2013-12-28 DIAGNOSIS — J90 Pleural effusion, not elsewhere classified: Secondary | ICD-10-CM | POA: Diagnosis not present

## 2013-12-28 DIAGNOSIS — F29 Unspecified psychosis not due to a substance or known physiological condition: Secondary | ICD-10-CM | POA: Diagnosis not present

## 2013-12-28 DIAGNOSIS — Z87891 Personal history of nicotine dependence: Secondary | ICD-10-CM | POA: Diagnosis not present

## 2013-12-28 DIAGNOSIS — E871 Hypo-osmolality and hyponatremia: Secondary | ICD-10-CM | POA: Diagnosis not present

## 2013-12-28 DIAGNOSIS — Z452 Encounter for adjustment and management of vascular access device: Secondary | ICD-10-CM | POA: Diagnosis not present

## 2013-12-28 DIAGNOSIS — J441 Chronic obstructive pulmonary disease with (acute) exacerbation: Secondary | ICD-10-CM | POA: Diagnosis not present

## 2013-12-28 DIAGNOSIS — Z7982 Long term (current) use of aspirin: Secondary | ICD-10-CM | POA: Diagnosis not present

## 2013-12-28 DIAGNOSIS — R059 Cough, unspecified: Secondary | ICD-10-CM | POA: Diagnosis not present

## 2013-12-28 DIAGNOSIS — R5383 Other fatigue: Secondary | ICD-10-CM | POA: Diagnosis present

## 2013-12-28 DIAGNOSIS — J209 Acute bronchitis, unspecified: Secondary | ICD-10-CM | POA: Diagnosis not present

## 2013-12-28 DIAGNOSIS — E41 Nutritional marasmus: Secondary | ICD-10-CM | POA: Diagnosis present

## 2013-12-28 DIAGNOSIS — N390 Urinary tract infection, site not specified: Secondary | ICD-10-CM | POA: Diagnosis not present

## 2013-12-28 DIAGNOSIS — J8410 Pulmonary fibrosis, unspecified: Secondary | ICD-10-CM | POA: Diagnosis present

## 2013-12-28 LAB — URINALYSIS, COMPLETE
Bilirubin,UR: NEGATIVE
Glucose,UR: NEGATIVE mg/dL (ref 0–75)
KETONE: NEGATIVE
NITRITE: NEGATIVE
Ph: 5 (ref 4.5–8.0)
Protein: NEGATIVE
SPECIFIC GRAVITY: 1.009 (ref 1.003–1.030)
Squamous Epithelial: <1
WBC UR: 675 /HPF (ref 0–5)

## 2013-12-28 LAB — OSMOLALITY, URINE: Osmolality: 252 mOsm/kg

## 2013-12-28 LAB — HEMOGLOBIN A1C: Hemoglobin A1C: 6.7 % — ABNORMAL HIGH (ref 4.2–6.3)

## 2013-12-28 LAB — SODIUM: Sodium: 124 mmol/L — ABNORMAL LOW (ref 136–145)

## 2013-12-28 LAB — PRO B NATRIURETIC PEPTIDE: B-Type Natriuretic Peptide: 738 pg/mL — ABNORMAL HIGH (ref 0–450)

## 2013-12-28 LAB — SODIUM, URINE, RANDOM: Sodium, Urine Random: 55 mmol/L (ref 20–110)

## 2013-12-28 LAB — MAGNESIUM: MAGNESIUM: 1.2 mg/dL — AB

## 2013-12-28 LAB — TSH: THYROID STIMULATING HORM: 3.4 u[IU]/mL

## 2013-12-29 ENCOUNTER — Ambulatory Visit: Payer: Medicare Other | Admitting: Internal Medicine

## 2013-12-29 ENCOUNTER — Telehealth: Payer: Self-pay | Admitting: Internal Medicine

## 2013-12-29 DIAGNOSIS — I059 Rheumatic mitral valve disease, unspecified: Secondary | ICD-10-CM

## 2013-12-29 LAB — CBC WITH DIFFERENTIAL/PLATELET
Basophil #: 0.1 10*3/uL (ref 0.0–0.1)
Basophil %: 1 %
Eosinophil #: 0 10*3/uL (ref 0.0–0.7)
Eosinophil %: 0.1 %
HCT: 37.3 % (ref 35.0–47.0)
HGB: 12.7 g/dL (ref 12.0–16.0)
LYMPHS ABS: 1.4 10*3/uL (ref 1.0–3.6)
LYMPHS PCT: 15.9 %
MCH: 28.5 pg (ref 26.0–34.0)
MCHC: 34.1 g/dL (ref 32.0–36.0)
MCV: 83 fL (ref 80–100)
MONO ABS: 1.2 x10 3/mm — AB (ref 0.2–0.9)
Monocyte %: 13.6 %
Neutrophil #: 6 10*3/uL (ref 1.4–6.5)
Neutrophil %: 69.4 %
PLATELETS: 279 10*3/uL (ref 150–440)
RBC: 4.47 10*6/uL (ref 3.80–5.20)
RDW: 13.4 % (ref 11.5–14.5)
WBC: 8.7 10*3/uL (ref 3.6–11.0)

## 2013-12-29 LAB — BASIC METABOLIC PANEL
ANION GAP: 5 — AB (ref 7–16)
BUN: 8 mg/dL (ref 7–18)
CHLORIDE: 89 mmol/L — AB (ref 98–107)
CO2: 27 mmol/L (ref 21–32)
Calcium, Total: 8.4 mg/dL — ABNORMAL LOW (ref 8.5–10.1)
Creatinine: 0.87 mg/dL (ref 0.60–1.30)
Glucose: 124 mg/dL — ABNORMAL HIGH (ref 65–99)
Osmolality: 244 (ref 275–301)
Potassium: 4.3 mmol/L (ref 3.5–5.1)
SODIUM: 121 mmol/L — AB (ref 136–145)

## 2013-12-29 NOTE — Telephone Encounter (Signed)
The patient's daughter [name] wants to talk to you about her mother Brittney Jackson.

## 2013-12-29 NOTE — Telephone Encounter (Signed)
Please advise if you would like for me to get more information

## 2013-12-29 NOTE — Telephone Encounter (Signed)
I asked Santiago Glad to call us back to let us know if she still would like for Dr. Derrel Nip to call her or if she would prefer to send a email.

## 2013-12-29 NOTE — Telephone Encounter (Signed)
It will be difficult to pin down a time because I am seeing patients all day and have a meeting at lunch.  Would she like to e mail me or wait for a time when I can call her?  Also if she is on t on her daughter's HIPAA form which I can't tell if she is or not,  I can listen but not offer any information

## 2013-12-29 NOTE — Telephone Encounter (Signed)
Left a detailed message to inform her that she is not on [place] HIPPA form giving [place] permission to discuss anything. Therefore, all Dr. Derrel Nip can do is listen.

## 2013-12-30 LAB — SODIUM
SODIUM: 120 mmol/L — AB (ref 136–145)
SODIUM: 118 mmol/L — AB (ref 136–145)
Sodium: 121 mmol/L — ABNORMAL LOW (ref 136–145)

## 2013-12-30 LAB — OSMOLALITY, URINE: Osmolality: 421 mOsm/kg

## 2013-12-30 LAB — CHLORIDE, URINE, RANDOM: CHLORIDE, URINE RANDOM: 22 mmol/L — AB (ref 55–125)

## 2013-12-30 LAB — SODIUM, URINE, RANDOM: SODIUM, URINE RANDOM: 20 mmol/L (ref 20–110)

## 2013-12-31 LAB — POTASSIUM, URINE RANDOM: Potassium, Urine Random: 22 mmol/L — ABNORMAL LOW (ref 55–125)

## 2013-12-31 LAB — BASIC METABOLIC PANEL
Anion Gap: 3 — ABNORMAL LOW (ref 7–16)
BUN: 16 mg/dL (ref 7–18)
CO2: 28 mmol/L (ref 21–32)
Calcium, Total: 8.2 mg/dL — ABNORMAL LOW (ref 8.5–10.1)
Chloride: 90 mmol/L — ABNORMAL LOW (ref 98–107)
Creatinine: 0.84 mg/dL (ref 0.60–1.30)
EGFR (Non-African Amer.): >60
GLUCOSE: 102 mg/dL — AB (ref 65–99)
Osmolality: 245 (ref 275–301)
Potassium: 4.9 mmol/L (ref 3.5–5.1)
Sodium: 121 mmol/L — ABNORMAL LOW (ref 136–145)

## 2013-12-31 LAB — SODIUM, URINE, RANDOM: Sodium, Urine Random: 53 mmol/L (ref 20–110)

## 2013-12-31 LAB — SODIUM
SODIUM: 120 mmol/L — AB (ref 136–145)
SODIUM: 122 mmol/L — AB (ref 136–145)
Sodium: 118 mmol/L — CL (ref 136–145)

## 2013-12-31 LAB — URINE CULTURE

## 2013-12-31 LAB — CHLORIDE, URINE, RANDOM: Chloride, Urine Random: 65 mmol/L (ref 55–125)

## 2014-01-01 LAB — OSMOLALITY: Osmolality: 292 mOsm/kg (ref 280–301)

## 2014-01-01 LAB — URIC ACID: Uric Acid: 3.1 mg/dL (ref 2.6–6.0)

## 2014-01-01 LAB — SODIUM
SODIUM: 124 mmol/L — AB (ref 136–145)
Sodium: 121 mmol/L — ABNORMAL LOW (ref 136–145)
Sodium: 125 mmol/L — ABNORMAL LOW (ref 136–145)
Sodium: 127 mmol/L — ABNORMAL LOW (ref 136–145)
Sodium: 128 mmol/L — ABNORMAL LOW (ref 136–145)
Sodium: 143 mmol/L (ref 136–145)

## 2014-01-02 LAB — PLATELET COUNT: Platelet: 338 10*3/uL (ref 150–440)

## 2014-01-02 LAB — SODIUM
SODIUM: 132 mmol/L — AB (ref 136–145)
Sodium: 128 mmol/L — ABNORMAL LOW (ref 136–145)
Sodium: 149 mmol/L — ABNORMAL HIGH (ref 136–145)
Sodium: 129 mmol/L — ABNORMAL LOW (ref 136–145)

## 2014-01-02 LAB — MAGNESIUM: MAGNESIUM: 1.8 mg/dL

## 2014-01-02 LAB — PHOSPHORUS: Phosphorus: 1.5 mg/dL — ABNORMAL LOW (ref 2.5–4.9)

## 2014-01-03 LAB — BASIC METABOLIC PANEL
Anion Gap: 5 — ABNORMAL LOW (ref 7–16)
BUN: 13 mg/dL (ref 7–18)
CALCIUM: 8.3 mg/dL — AB (ref 8.5–10.1)
CHLORIDE: 103 mmol/L (ref 98–107)
CREATININE: 0.76 mg/dL (ref 0.60–1.30)
Co2: 26 mmol/L (ref 21–32)
EGFR (Non-African Amer.): >60
Glucose: 98 mg/dL (ref 65–99)
Osmolality: 268 (ref 275–301)
POTASSIUM: 3.8 mmol/L (ref 3.5–5.1)
SODIUM: 131 mmol/L — AB (ref 136–145)

## 2014-01-03 LAB — SODIUM
SODIUM: 128 mmol/L — AB (ref 136–145)
Sodium: 132 mmol/L — ABNORMAL LOW (ref 136–145)
Sodium: 133 mmol/L — ABNORMAL LOW (ref 136–145)
Sodium: 130 mmol/L — ABNORMAL LOW (ref 136–145)

## 2014-01-03 LAB — CBC WITH DIFFERENTIAL/PLATELET
BASOS ABS: 0.1 10*3/uL (ref 0.0–0.1)
Basophil %: 0.8 %
EOS PCT: 2.5 %
Eosinophil #: 0.3 10*3/uL (ref 0.0–0.7)
HCT: 32.6 % — AB (ref 35.0–47.0)
HGB: 11.1 g/dL — AB (ref 12.0–16.0)
LYMPHS ABS: 2.2 10*3/uL (ref 1.0–3.6)
Lymphocyte %: 21.7 %
MCH: 28.8 pg (ref 26.0–34.0)
MCHC: 34.1 g/dL (ref 32.0–36.0)
MCV: 84 fL (ref 80–100)
MONO ABS: 1.7 x10 3/mm — AB (ref 0.2–0.9)
Monocyte %: 17.3 %
NEUTROS ABS: 5.8 10*3/uL (ref 1.4–6.5)
Neutrophil %: 57.7 %
PLATELETS: 376 10*3/uL (ref 150–440)
RBC: 3.86 10*6/uL (ref 3.80–5.20)
RDW: 13.4 % (ref 11.5–14.5)
WBC: 10 10*3/uL (ref 3.6–11.0)

## 2014-01-03 LAB — MAGNESIUM: MAGNESIUM: 1.7 mg/dL — AB

## 2014-01-03 LAB — PHOSPHORUS: Phosphorus: 2.8 mg/dL (ref 2.5–4.9)

## 2014-01-04 LAB — BASIC METABOLIC PANEL
Anion Gap: 3 — ABNORMAL LOW (ref 7–16)
BUN: 20 mg/dL — AB (ref 7–18)
CREATININE: 0.87 mg/dL (ref 0.60–1.30)
Calcium, Total: 8.2 mg/dL — ABNORMAL LOW (ref 8.5–10.1)
Chloride: 99 mmol/L (ref 98–107)
Co2: 27 mmol/L (ref 21–32)
EGFR (African American): >60
Glucose: 108 mg/dL — ABNORMAL HIGH (ref 65–99)
Osmolality: 262 (ref 275–301)
Potassium: 4 mmol/L (ref 3.5–5.1)
SODIUM: 129 mmol/L — AB (ref 136–145)

## 2014-01-04 LAB — SODIUM
Sodium: 130 mmol/L — ABNORMAL LOW (ref 136–145)
Sodium: 131 mmol/L — ABNORMAL LOW (ref 136–145)

## 2014-01-05 LAB — BASIC METABOLIC PANEL
ANION GAP: 5 — AB (ref 7–16)
BUN: 20 mg/dL — ABNORMAL HIGH (ref 7–18)
CALCIUM: 8.3 mg/dL — AB (ref 8.5–10.1)
Chloride: 97 mmol/L — ABNORMAL LOW (ref 98–107)
Co2: 28 mmol/L (ref 21–32)
Creatinine: 0.82 mg/dL (ref 0.60–1.30)
EGFR (African American): >60
EGFR (Non-African Amer.): >60
Glucose: 106 mg/dL — ABNORMAL HIGH (ref 65–99)
Osmolality: 264 (ref 275–301)
POTASSIUM: 4.3 mmol/L (ref 3.5–5.1)
SODIUM: 130 mmol/L — AB (ref 136–145)

## 2014-01-09 ENCOUNTER — Encounter: Payer: Self-pay | Admitting: Internal Medicine

## 2014-01-09 ENCOUNTER — Telehealth: Payer: Self-pay | Admitting: Internal Medicine

## 2014-01-09 ENCOUNTER — Ambulatory Visit (INDEPENDENT_AMBULATORY_CARE_PROVIDER_SITE_OTHER): Payer: Medicare Other | Admitting: Internal Medicine

## 2014-01-09 VITALS — BP 164/78 | HR 90 | Temp 98.0°F | Resp 14 | Wt 141.5 lb

## 2014-01-09 DIAGNOSIS — J0100 Acute maxillary sinusitis, unspecified: Secondary | ICD-10-CM | POA: Insufficient documentation

## 2014-01-09 DIAGNOSIS — I10 Essential (primary) hypertension: Secondary | ICD-10-CM

## 2014-01-09 DIAGNOSIS — E871 Hypo-osmolality and hyponatremia: Secondary | ICD-10-CM | POA: Insufficient documentation

## 2014-01-09 LAB — BASIC METABOLIC PANEL
BUN: 18 mg/dL (ref 6–23)
CALCIUM: 8.7 mg/dL (ref 8.4–10.5)
CO2: 26 mEq/L (ref 19–32)
Chloride: 101 mEq/L (ref 96–112)
Creatinine, Ser: 1 mg/dL (ref 0.4–1.2)
GFR: 59.19 mL/min — ABNORMAL LOW (ref >60.00)
GLUCOSE: 117 mg/dL — AB (ref 70–99)
Potassium: 4 mEq/L (ref 3.5–5.1)
Sodium: 134 mEq/L — ABNORMAL LOW (ref 135–145)

## 2014-01-09 LAB — CBC WITH DIFFERENTIAL/PLATELET
BASOS ABS: 0 10*3/uL (ref 0.0–0.1)
Basophils Relative: 0.6 % (ref 0.0–3.0)
Eosinophils Absolute: 0.2 10*3/uL (ref 0.0–0.7)
Eosinophils Relative: 2 % (ref 0.0–5.0)
HCT: 30.7 % — ABNORMAL LOW (ref 36.0–46.0)
Hemoglobin: 10.4 g/dL — ABNORMAL LOW (ref 12.0–15.0)
LYMPHS PCT: 24.4 % (ref 12.0–46.0)
Lymphs Abs: 1.9 10*3/uL (ref 0.7–4.0)
MCHC: 33.9 g/dL (ref 30.0–36.0)
MCV: 86.7 fl (ref 78.0–100.0)
Monocytes Absolute: 1 10*3/uL (ref 0.1–1.0)
Monocytes Relative: 12.9 % — ABNORMAL HIGH (ref 3.0–12.0)
NEUTROS PCT: 60.1 % (ref 43.0–77.0)
Neutro Abs: 4.7 10*3/uL (ref 1.4–7.7)
PLATELETS: 378 10*3/uL (ref 150.0–400.0)
RBC: 3.55 Mil/uL — ABNORMAL LOW (ref 3.87–5.11)
RDW: 13.5 % (ref 11.5–14.6)
WBC: 7.8 10*3/uL (ref 4.5–10.5)

## 2014-01-09 LAB — MAGNESIUM: Magnesium: 1.7 mg/dL (ref 1.5–2.5)

## 2014-01-09 LAB — PHOSPHORUS: PHOSPHORUS: 3.7 mg/dL (ref 2.3–4.6)

## 2014-01-09 MED ORDER — LEVOFLOXACIN 250 MG PO TABS: 250.0000 mg | ORAL_TABLET | Freq: Every day | ORAL | Status: DC

## 2014-01-09 NOTE — Assessment & Plan Note (Signed)
Now on labetalol and losartan since hospitalization for hypertensive emergency and hyponatremia.

## 2014-01-09 NOTE — Progress Notes (Signed)
Pre-visit discussion using our clinic review tool. No additional management support is needed unless otherwise documented below in the visit note.  

## 2014-01-09 NOTE — Assessment & Plan Note (Signed)
Severe, symptomatic,  seocndary to COPD exacerbations,  Improved,  Repeat today is 134.    

## 2014-01-09 NOTE — Progress Notes (Signed)
Patient ID: Brittney Jackson, female   DOB: 09/07/1930, 80 y.o.   MRN: [id number]  Patient Active Problem List   Diagnosis Date Noted  . Hyposmolality and/or hyponatremia 01/09/2014  . Sinusitis, acute, maxillary 01/09/2014  . Routine general medical examination at a health care facility 01/24/2013  . Hyponatremia 07/23/2012  . Osteopenia 04/26/2012  . Hypertension 01/22/2012  . Hypothyroidism 01/22/2012  . Hyperlipidemia LDL goal < 100 01/22/2012    Subjective:  CC:   Chief Complaint  Patient presents with  . Follow-up    follow up for hospital.    HPI:   Brittney Jackson a 80 y.o. female who presents for multiple issues.  PATIENT was rescheduled from previous  30 minute slot on Thursday for hospital follow up  into  [facility] [place]  For evaluation of sinusitis  .  Symptoms of right sided sinus pressure and drainage  started during recent prolonged hospitalization at [facility]  for symptomatic hyponatremia and hypertensive emergency.  She was admitted on dec 31 with above and also found to be hypmagnesemic and low in phosphorus as well.  Was given 3% NS with persistent hyponatremia,  treated with levaquin 250 mg for at least 4 days for UTI or COPD exacerbation (DC summary says COPD, but patient says asympomtatic UTI) , and sent home  On labetalol, losartan and clonidine.  Discharged on January 9th with nephrology follow up January 16th   Currently having some post nasal drainage that is green.  Right side only,  Left side has a little bit of blood streaks which is chronic.  Some right sided maxillary pressure  Feels full.  Not severe pain .  Started while she was in the hospital.  Has not had symptoms like this in over 30 years.  This feels different that viral infeciton.,  discharge is green.  No fevers reported,  No cough.    Past Medical History  Diagnosis Date  . Osteoporosis   . Hypertension   . Hypothyroidism     No past surgical history on file.  .  pneumococcal 23 valent vaccine  0.5 mL Intramuscular Once     The following portions of the patient's history were reviewed and updated as appropriate: Allergies, current medications, and problem list.    Review of Systems:   12 Pt  review of systems was negative except those addressed in the HPI,     History   Social History  . Marital Status: Widowed    Spouse Name: N/A    Number of Children: N/A  . Years of Education: N/A   Occupational History  . 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 Smoking status: Former Smoker    Types: Cigarettes    Quit date: 07/21/2000  . Smokeless tobacco: Never Used  . Alcohol Use: No  . Drug Use: No  . Sexual Activity: Not on file   Other Topics Concern  . Not on file   Social History Narrative   Church activities, community service.   Lives alone.    Objective:  Filed Vitals:   01/09/14 1238  BP: 164/78  Pulse: 90  Temp: 98 F (36.7 C)  Resp: 14     General appearance: alert, cooperative and appears stated age Ears: normal TM's and external ear canals both ears Throat: lips, mucosa, and tongue normal; teeth and gums normal Neck: no adenopathy, no carotid bruit, supple, symmetrical, trachea midline and  thyroid not enlarged, symmetric, no tenderness/mass/nodules Back: symmetric, no curvature. ROM normal. No CVA tenderness. Lungs: clear to auscultation bilaterally Heart: regular rate and rhythm, S1, S2 normal, no murmur, click, rub or gallop Abdomen: soft, non-tender; bowel sounds normal; no masses,  no organomegaly Pulses: 2+ and symmetric Skin: Skin color, texture, turgor normal. No rashes or lesions Lymph nodes: Cervical, supraclavicular, and axillary nodes normal.  Assessment and Plan:  Hypertension Now on labetalol and losartan since hospitalization for hypertensive emergency and hyponatremia.   Hyponatremia Severe, symptomatic,  seocndary to COPD exacerbations,   Improved,  Repeat today is 134.   Sinusitis, acute, maxillary Her exam is benign.   Advised to generic OTC benadryl 12.5 mg every 8 hours for the drainage,  Afri topical spray  For the congestion,  and flush her sinuses twice daily with Simply Saline.  Add levaquin only if symptomss worsen.     A total of 40 minutes was spent with patient more than half of which was spent in counseling, reviewing records from other prviders and coordination of care.  Updated Medication List Outpatient Encounter Prescriptions as of 01/09/2014  Medication Sig  . aspirin 81 MG tablet Take 81 mg by mouth daily.  . budesonide-formoterol (SYMBICORT) 160-4.5 MCG/ACT inhaler Inhale 1 puff into the lungs 2 (two) times daily.  . cholecalciferol (VITAMIN D) 1000 UNITS tablet Take 1,000 Units by mouth daily.  . cloNIDine (CATAPRES) 0.1 MG tablet Take 0.1 mg by mouth 2 (two) times daily.  Marland Kitchen labetalol (NORMODYNE) 100 MG tablet Take 150 mg by mouth 2 (two) times daily.  Marland Kitchen levothyroxine (SYNTHROID, LEVOTHROID) 88 MCG tablet TAKE ONE (1) TABLET EACH DAY  . losartan (COZAAR) 100 MG tablet TAKE ONE (1) TABLET EACH DAY  . Oral Electrolytes (BUFFERED SALT) TABS Take 1 tablet by mouth 3 (three) times daily with meals.  . pantoprazole (PROTONIX) 40 MG tablet Take 40 mg by mouth daily.  . simvastatin (ZOCOR) 40 MG tablet TAKE ONE (1) TABLET AT BEDTIME  . levofloxacin (LEVAQUIN) 250 MG tablet Take 1 tablet (250 mg total) by mouth daily.  Marland Kitchen lisinopril (PRINIVIL,ZESTRIL) 40 MG tablet TAKE ONE TABLET TWICE DAILY  . TDaP (BOOSTRIX) 5-2.5-18.5 LF-MCG/0.5 injection Inject 0.5 mLs into the muscle once.  . zoster vaccine live, PF, (ZOSTAVAX) 83662 UNT/0.65ML injection Inject 19,400 Units into the skin once.     Orders Placed This Encounter  Procedures  . Basic metabolic panel  . CBC with Differential  . Phosphorus  . Magnesium    No Follow-up on file.

## 2014-01-09 NOTE — Telephone Encounter (Signed)
Patient Information:  Caller Name: Abigail Butts  Phone: [phone]  Patient: Brittney Jackson  Gender: Female  DOB: 1930/03/22  Age: 80 Years  PCP: Deborra Medina (Adults only)  Office Follow Up:  Does the office need to follow up with this patient?: No  Instructions For The Office: N/A   Symptoms  Reason For Call & Symptoms: Daughter calling to see if pt can get an earlier appt than 01/11/14; if not to see if an anitbiotic called in. Not with pt at the moment. Unable to triage. [facility] from 8 day stay for low sodium and URI. Still think she has a sinus infection. Offered appt at 1230. To bring pt then. Cancelled f/u appt for 01/11/14.  Reviewed Health History In EMR: N/A  Reviewed Medications In EMR: N/A  Reviewed Allergies In EMR: N/A  Reviewed Surgeries / Procedures: N/A  Date of Onset of Symptoms: 12/26/2013  Guideline(s) Used:  No Protocol Available - Sick Adult  Disposition Per Guideline:   See Today or Tomorrow in Office  Reason For Disposition Reached:   Nursing judgment  Advice Given:  Call Back If:  New symptoms develop  You become worse.  Patient Will Follow Care Advice:  YES  Appointment Scheduled:  01/09/2014 12:30:00 Appointment Scheduled Provider:  Deborra Medina (Adults only)

## 2014-01-09 NOTE — Patient Instructions (Addendum)
BP is fine as long as it is 110/60 or higher.,  Lower than that is too low.  continue losartan and labetalol as directed.  Repeat sodium today  After lunch  You have a viral  Syndrome .  The post nasal drip is causing your sore throat.  Continue to lavage your sinuses twice daily with Simply saline nasal spray.  Use benadryl 12.5 mg every 8 hours and Afrin nasal spray twice daily for 5 days t o manage the drainage and congestion.  Gargle with salt water often for the sore throat.  If the throat is no better  In 3 to 4 days OR  if you develop T > 100.4, bloody nasal discharge on the right ,  Or facial pain, start the levaquin   Please take a probiotic ( Align, Floraque or Culturelle) while you are on the antibiotic to prevent a serious antibiotic associated diarrhea  Called clostirudium dificile colitis and a vaginal yeast infection   Bring your BP machine with you on Thursday

## 2014-01-09 NOTE — Assessment & Plan Note (Signed)
Her exam is benign.   Advised to generic OTC benadryl 12.5 mg every 8 hours for the drainage,  Afri topical spray  For the congestion,  and flush her sinuses twice daily with Simply Saline.  Add levaquin only if symptomss worsen.

## 2014-01-10 ENCOUNTER — Telehealth: Payer: Self-pay | Admitting: Internal Medicine

## 2014-01-10 ENCOUNTER — Ambulatory Visit: Payer: Medicare Other | Admitting: *Deleted

## 2014-01-10 VITALS — BP 172/94

## 2014-01-10 DIAGNOSIS — I10 Essential (primary) hypertension: Secondary | ICD-10-CM

## 2014-01-10 NOTE — Telephone Encounter (Signed)
Daughter called back and stated her mother is taking clonidine 0.1 mg and BP is still elevated she ask can the clonidine be increased.

## 2014-01-10 NOTE — Telephone Encounter (Signed)
Patient Information:  Caller Name: Abigail Butts  Phone: [phone]  Patient: Brittney Jackson  Gender: Female  DOB: [birth year]  Age: 80 Years  PCP: Deborra Medina (Adults only)  Office Follow Up:  Does the office need to follow up with this patient?: Yes  Instructions For The Office: Please call.  RN Note:  Daughter is calling as pt's BP is running high. Her cuff will not read if the systolic is > 308 and it will not read. Taking her meds as directed. She is currently at the hair dresser getting her hair done.  Symptoms  Reason For Call & Symptoms: HTN  Reviewed Health History In EMR: Yes  Reviewed Medications In EMR: Yes  Reviewed Allergies In EMR: Yes  Reviewed Surgeries / Procedures: Yes  Date of Onset of Symptoms: 01/09/2014  Guideline(s) Used:  High Blood Pressure  Disposition Per Guideline:   See Today in Office  Reason For Disposition Reached:   Patient wants to be seen  Advice Given:  Call Back If:  You become worse.  Patient Will Follow Care Advice:  YES

## 2014-01-10 NOTE — Telephone Encounter (Signed)
Patient advised as requested 

## 2014-01-10 NOTE — Telephone Encounter (Signed)
She clearly needs additional medication.  since she still has the clonidine she should start taking it twice daily as part of her daily regimen .  She can bring her in but starting the clonidine is what im going to recommend.

## 2014-01-10 NOTE — Telephone Encounter (Signed)
I would double the labetalol dose first  Not the clonidine. Twice daily

## 2014-01-10 NOTE — Telephone Encounter (Signed)
Patient states she has been taking the clonidine 0.1 mg since her discharge from the hospital a;long wit the other BP meds please advise if she needs to increase BP medication.

## 2014-01-10 NOTE — Telephone Encounter (Signed)
Spoke with pt's daughter, Abigail Butts. Last night, BP 159/114, pt took her evening BP medications, recheck blood pressure was 146/72. This morning, BP machine would not read, states her machine only reads to 979 systolic. She took her morning medications, recheck afterwards was 149/71. Now unable to get machine to read pressures again, worried it is elevated.  Denies any acute symptoms, no pain, no CP, no shortness of breath. Has appointment tomorrow with Dr. Derrel Nip. Asked daughter if she could take her somewhere to have it rechecked. Do you want her to come for nurse visit today to check?

## 2014-01-10 NOTE — Progress Notes (Signed)
   Subjective:    Patient ID: Brittney Jackson, female    DOB: 1930-09-01, 80 y.o.   MRN: [id number]  HPI    Review of Systems     Objective:   Physical Exam        Assessment & Plan:

## 2014-01-10 NOTE — Progress Notes (Signed)
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

## 2014-01-11 ENCOUNTER — Encounter: Payer: Self-pay | Admitting: Internal Medicine

## 2014-01-11 ENCOUNTER — Ambulatory Visit: Payer: Medicare Other | Admitting: Internal Medicine

## 2014-01-11 ENCOUNTER — Ambulatory Visit (INDEPENDENT_AMBULATORY_CARE_PROVIDER_SITE_OTHER): Payer: Medicare Other | Admitting: Internal Medicine

## 2014-01-11 VITALS — BP 140/62 | HR 70 | Temp 97.4°F | Resp 16 | Wt 139.5 lb

## 2014-01-11 DIAGNOSIS — J0100 Acute maxillary sinusitis, unspecified: Secondary | ICD-10-CM

## 2014-01-11 DIAGNOSIS — E871 Hypo-osmolality and hyponatremia: Secondary | ICD-10-CM

## 2014-01-11 DIAGNOSIS — I10 Essential (primary) hypertension: Secondary | ICD-10-CM

## 2014-01-11 MED ORDER — CLONIDINE HCL 0.1 MG PO TABS: 0.1000 mg | ORAL_TABLET | Freq: Two times a day (BID) | ORAL | Status: DC

## 2014-01-11 MED ORDER — LEVOFLOXACIN 250 MG PO TABS: 250.0000 mg | ORAL_TABLET | Freq: Every day | ORAL | Status: DC

## 2014-01-11 MED ORDER — LABETALOL HCL 300 MG PO TABS: 300.0000 mg | ORAL_TABLET | Freq: Two times a day (BID) | ORAL | Status: DC

## 2014-01-11 NOTE — Patient Instructions (Addendum)
Your blood pressure has improved with the higher dose of labetalol (300 mg twice daily) Continue labetalol 300 mg twice daily, losartan 100 mg once daily,  And clonidine 0.1 mg twice daily  If you increase your fluid intake,  Use fluids that have some sodium in it. I recommend G2  (low carbohydrate gatorade)   Since your right maxillary sinus is bothering you more,  You can start the antibiotic I have given you Please take a probiotic ( Align, Floraque or Culturelle) while you are on the antibiotic to prevent  clostridium dificile colitis and a vaginal yeast infection   Return in one month , sooner if needed

## 2014-01-11 NOTE — Progress Notes (Signed)
Patient ID: Brittney Jackson, female   DOB: December 28, 1930, 80 y.o.   MRN: [id number]  Patient Active Problem List   Diagnosis Date Noted  . Hyposmolality and/or hyponatremia 01/09/2014  . Sinusitis, acute, maxillary 01/09/2014  . Routine general medical examination at a health care facility 01/24/2013  . Hyponatremia 07/23/2012  . Osteopenia 04/26/2012  . Hypertension 01/22/2012  . Hypothyroidism 01/22/2012  . Hyperlipidemia LDL goal < 100 01/22/2012    Subjective:  CC:   Chief Complaint  Patient presents with  . Follow-up    from hospitals and on sinus and BP    HPI:   Brittney Jackson a 80 y.o. female who presents for follow up on hypertension,  Hyponatremia ,  and sinus pain on right side.  Teeth hurting,  And drainage is thick and jelly like despite using saline flush   Her blood pressure has improved with increased labetalol dose. No side effects.   Past Medical History  Diagnosis Date  . Osteoporosis   . Hypertension   . Hypothyroidism     History reviewed. No pertinent past surgical history.  . pneumococcal 23 valent vaccine  0.5 mL Intramuscular Once     The following portions of the patient's history were reviewed and updated as appropriate: Allergies, current medications, and problem list.    Review of Systems:   12 Pt  review of systems was negative except those addressed in the HPI,     History   Social History  . Marital Status: Widowed    Spouse Name: N/A    Number of Children: N/A  . Years of Education: N/A   Occupational History  . 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 Smoking status: Former Smoker    Types: Cigarettes    Quit date: 07/21/2000  . Smokeless tobacco: Never Used  . Alcohol Use: No  . Drug Use: No  . Sexual Activity: Not on file   Other Topics Concern  . Not on file   Social History Narrative   Church activities, community service.   Lives alone.     Objective:  Filed Vitals:   01/11/14 1033  BP: 140/62  Pulse: 70  Temp: 97.4 F (36.3 C)  Resp: 16     General appearance: alert, cooperative and appears stated age Ears: normal TM's and external ear canals both ears Throat: lips, mucosa, and tongue normal; teeth and gums normal Neck: no adenopathy, no carotid bruit, supple, symmetrical, trachea midline and thyroid not enlarged, symmetric, no tenderness/mass/nodules Back: symmetric, no curvature. ROM normal. No CVA tenderness. Lungs: clear to auscultation bilaterally Heart: regular rate and rhythm, S1, S2 normal, no murmur, click, rub or gallop Abdomen: soft, non-tender; bowel sounds normal; no masses,  no organomegaly Pulses: 2+ and symmetric Skin: Skin color, texture, turgor normal. No rashes or lesions Lymph nodes: Cervical, supraclavicular, and axillary nodes normal.  Assessment and Plan:  Hypertension Improved with increased labetalol dose to 300 mg bid.   Hyponatremia Severe, symptomatic,  seocndary to COPD exacerbations,  Improved,  Repeat today is 134.     Sinusitis, acute, maxillary Given chronicity of symptoms, development of facial pain and exam consistent with bacterial URI,  Will treat with empiric antibiotics, decongestants, and saline lavage.     Updated Medication List Outpatient Encounter Prescriptions as of 01/11/2014  Medication Sig  . aspirin 81 MG tablet Take 81 mg by mouth daily.  . budesonide-formoterol (SYMBICORT) 160-4.5 MCG/ACT inhaler Inhale 1 puff  into the lungs 2 (two) times daily.  . cholecalciferol (VITAMIN D) 1000 UNITS tablet Take 1,000 Units by mouth daily.  . cloNIDine (CATAPRES) 0.1 MG tablet Take 1 tablet (0.1 mg total) by mouth 2 (two) times daily.  Marland Kitchen labetalol (NORMODYNE) 300 MG tablet Take 1 tablet (300 mg total) by mouth 2 (two) times daily.  Marland Kitchen levofloxacin (LEVAQUIN) 250 MG tablet Take 1 tablet (250 mg total) by mouth daily.  Marland Kitchen levothyroxine (SYNTHROID, LEVOTHROID) 88 MCG  tablet TAKE ONE (1) TABLET EACH DAY  . losartan (COZAAR) 100 MG tablet TAKE ONE (1) TABLET EACH DAY  . Oral Electrolytes (BUFFERED SALT) TABS Take 1 tablet by mouth 3 (three) times daily with meals.  . pantoprazole (PROTONIX) 40 MG tablet Take 40 mg by mouth daily.  . simvastatin (ZOCOR) 40 MG tablet TAKE ONE (1) TABLET AT BEDTIME  . TDaP (BOOSTRIX) 5-2.5-18.5 LF-MCG/0.5 injection Inject 0.5 mLs into the muscle once.  . zoster vaccine live, PF, (ZOSTAVAX) 67341 UNT/0.65ML injection Inject 19,400 Units into the skin once.  . [DISCONTINUED] cloNIDine (CATAPRES) 0.1 MG tablet Take 0.1 mg by mouth 2 (two) times daily.  . [DISCONTINUED] cloNIDine (CATAPRES) 0.1 MG tablet Take 1 tablet (0.1 mg total) by mouth 2 (two) times daily.  . [DISCONTINUED] labetalol (NORMODYNE) 100 MG tablet Take 150 mg by mouth 2 (two) times daily.  . [DISCONTINUED] levofloxacin (LEVAQUIN) 250 MG tablet Take 1 tablet (250 mg total) by mouth daily.     No orders of the defined types were placed in this encounter.    No Follow-up on file.

## 2014-01-11 NOTE — Progress Notes (Signed)
Pre-visit discussion using our clinic review tool. No additional management support is needed unless otherwise documented below in the visit note.  

## 2014-01-13 ENCOUNTER — Encounter: Payer: Self-pay | Admitting: Internal Medicine

## 2014-01-13 NOTE — Assessment & Plan Note (Signed)
Given chronicity of symptoms, development of facial pain and exam consistent with bacterial URI,  Will treat with empiric antibiotics, decongestants, and saline lavage.   

## 2014-01-13 NOTE — Assessment & Plan Note (Signed)
Improved with increased labetalol dose to 300 mg bid.

## 2014-01-13 NOTE — Assessment & Plan Note (Signed)
Severe, symptomatic,  seocndary to COPD exacerbations,  Improved,  Repeat today is 134.

## 2014-01-22 DIAGNOSIS — E871 Hypo-osmolality and hyponatremia: Secondary | ICD-10-CM | POA: Diagnosis not present

## 2014-01-22 DIAGNOSIS — I10 Essential (primary) hypertension: Secondary | ICD-10-CM | POA: Diagnosis not present

## 2014-01-31 ENCOUNTER — Telehealth: Payer: Self-pay | Admitting: Internal Medicine

## 2014-01-31 ENCOUNTER — Ambulatory Visit: Payer: Medicare Other | Admitting: Internal Medicine

## 2014-01-31 NOTE — Telephone Encounter (Signed)
Relevant patient education assigned to patient using Emmi. ° °

## 2014-02-12 ENCOUNTER — Other Ambulatory Visit: Payer: Self-pay | Admitting: Internal Medicine

## 2014-02-19 DIAGNOSIS — E871 Hypo-osmolality and hyponatremia: Secondary | ICD-10-CM | POA: Diagnosis not present

## 2014-02-19 DIAGNOSIS — I10 Essential (primary) hypertension: Secondary | ICD-10-CM | POA: Diagnosis not present

## 2014-02-21 ENCOUNTER — Ambulatory Visit (INDEPENDENT_AMBULATORY_CARE_PROVIDER_SITE_OTHER): Payer: Medicare Other | Admitting: Internal Medicine

## 2014-02-21 ENCOUNTER — Ambulatory Visit: Payer: Medicare Other | Admitting: Internal Medicine

## 2014-02-21 VITALS — BP 198/100 | HR 76 | Temp 97.4°F | Resp 16 | Wt 138.5 lb

## 2014-02-21 DIAGNOSIS — E039 Hypothyroidism, unspecified: Secondary | ICD-10-CM

## 2014-02-21 DIAGNOSIS — E871 Hypo-osmolality and hyponatremia: Secondary | ICD-10-CM

## 2014-02-21 DIAGNOSIS — I10 Essential (primary) hypertension: Secondary | ICD-10-CM

## 2014-02-21 NOTE — Patient Instructions (Signed)
Your blood pressure is re bounding from stopping the  morning dose of clonidine,  Resume 1/2 tablet of clonidine in the morning,  continue full tablet at night   Continue 300 mg labetalol twice daily   When you get home take an extra labetalol tonight, (unless yoru bp at home is < 140)  Repeat at 9 PM

## 2014-02-21 NOTE — Progress Notes (Signed)
Pre-visit discussion using our clinic review tool. No additional management support is needed unless otherwise documented below in the visit note.  

## 2014-02-21 NOTE — Progress Notes (Signed)
Patient ID: Brittney Jackson, female   DOB: June 07, 1930, 80 y.o.   MRN: [id number]  Patient Active Problem List   Diagnosis Date Noted  . Hypertension 01/22/2012    Priority: High  . Hyposmolality and/or hyponatremia 01/09/2014  . Routine general medical examination at a health care facility 01/24/2013  . Hyponatremia 07/23/2012  . Osteopenia 04/26/2012  . Hypothyroidism 01/22/2012  . Hyperlipidemia LDL goal < 100 01/22/2012    Subjective:  CC:   Chief Complaint  Patient presents with  . Follow-up    6 month    HPI:   Brittney Jackson is a 80 y.o. female who presents for Follow up on hypertension with recent hospitalization for uncontrolled hypertension complicated by persistent hyponatremia.   She has been taking her sodium tablets as directed; 1 gram  tid and has had her fluid intake increased to 1300 ccs daily by nephrology  She has continued to have labile hypertension  And brings a log of readings done twice daily.  She is taking losartan 100 mg daily .  Labetalol 300 mg bid.  And clonidine 0.1 mg atr bedtime.    Past Medical History  Diagnosis Date  . Osteoporosis   . Hypertension   . Hypothyroidism     History reviewed. No pertinent past surgical history.     The following portions of the patient's history were reviewed and updated as appropriate: Allergies, current medications, and problem list.    Review of Systems:   Patient denies headache, fevers, malaise, unintentional weight loss, skin rash, eye pain, sinus congestion and sinus pain, sore throat, dysphagia,  hemoptysis , cough, dyspnea, wheezing, chest pain, palpitations, orthopnea, edema, abdominal pain, nausea, melena, diarrhea, constipation, flank pain, dysuria, hematuria, urinary  Frequency, nocturia, numbness, tingling, seizures,  Focal weakness, Loss of consciousness,  Tremor, insomnia, depression, anxiety, and suicidal ideation.     History   Social History  . Marital Status: Widowed    Spouse  Name: N/A    Number of Children: N/A  . Years of Education: N/A   Occupational History  . 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 Smoking status: Former Smoker    Types: Cigarettes    Quit date: 07/21/2000  . Smokeless tobacco: Never Used  . Alcohol Use: No  . Drug Use: No  . Sexual Activity: Not on file   Other Topics Concern  . Not on file   Social History Narrative   Church activities, community service.   Lives alone.    Objective:  Filed Vitals:   02/21/14 1610  BP: 198/100  Pulse:   Temp:   Resp:      General appearance: alert, cooperative and appears stated age Ears: normal TM's and external ear canals both ears Throat: lips, mucosa, and tongue normal; teeth and gums normal Neck: no adenopathy, no carotid bruit, supple, symmetrical, trachea midline and thyroid not enlarged, symmetric, no tenderness/mass/nodules Back: symmetric, no curvature. ROM normal. No CVA tenderness. Lungs: clear to auscultation bilaterally Heart: regular rate and rhythm, S1, S2 normal, no murmur, click, rub or gallop Abdomen: soft, non-tender; bowel sounds normal; no masses,  no organomegaly Pulses: 2+ and symmetric Skin: Skin color, texture, turgor normal. No rashes or lesions Lymph nodes: Cervical, supraclavicular, and axillary nodes normal.  Assessment and Plan:  Hypertension Uncontrolled since she changed the clonidine  To night time only dosing because of systolics in the 478 range. Advised to continue losartan once daily, labetalol twice  daily and resume clonidine at 1/2 tablet in the morning .  Hyponatremia stable with salt supplementation and fluid restriction.   Hypothyroidism Thyroid function is WNL on current dose.  No current changes needed.  Lab Results  Component Value Date   TSH 0.79 01/24/2013      Updated Medication List Outpatient Encounter Prescriptions as of 02/21/2014  Medication Sig  . aspirin 81 MG tablet  Take 81 mg by mouth daily.  . cholecalciferol (VITAMIN D) 1000 UNITS tablet Take 1,000 Units by mouth daily.  . cloNIDine (CATAPRES) 0.1 MG tablet Take 0.1 mg by mouth daily.  Marland Kitchen labetalol (NORMODYNE) 300 MG tablet Take 1 tablet (300 mg total) by mouth 2 (two) times daily.  Marland Kitchen levothyroxine (SYNTHROID, LEVOTHROID) 88 MCG tablet TAKE ONE (1) TABLET BY MOUTH EVERY DAY  . losartan (COZAAR) 100 MG tablet TAKE ONE (1) TABLET EACH DAY  . Oral Electrolytes (BUFFERED SALT) TABS Take 1 tablet by mouth 3 (three) times daily with meals.  . simvastatin (ZOCOR) 40 MG tablet TAKE ONE (1) TABLET AT BEDTIME  . [DISCONTINUED] cloNIDine (CATAPRES) 0.1 MG tablet Take 1 tablet (0.1 mg total) by mouth 2 (two) times daily.  . budesonide-formoterol (SYMBICORT) 160-4.5 MCG/ACT inhaler Inhale 1 puff into the lungs 2 (two) times daily.  Marland Kitchen levofloxacin (LEVAQUIN) 250 MG tablet Take 1 tablet (250 mg total) by mouth daily.  . pantoprazole (PROTONIX) 40 MG tablet Take 40 mg by mouth daily.  . TDaP (BOOSTRIX) 5-2.5-18.5 LF-MCG/0.5 injection Inject 0.5 mLs into the muscle once.  . zoster vaccine live, PF, (ZOSTAVAX) 02725 UNT/0.65ML injection Inject 19,400 Units into the skin once.  . [DISCONTINUED] pneumococcal 23 valent vaccine (PNU-IMMUNE) injection 0.5 mL      No orders of the defined types were placed in this encounter.    No Follow-up on file.

## 2014-02-21 NOTE — Assessment & Plan Note (Addendum)
Uncontrolled since she changed the clonidine  To night time only dosing because of systolics in the 591 range. Advised to continue losartan once daily, labetalol twice daily and resume clonidine at 1/2 tablet in the morning .

## 2014-02-22 ENCOUNTER — Encounter: Payer: Self-pay | Admitting: Internal Medicine

## 2014-02-22 NOTE — Assessment & Plan Note (Signed)
Thyroid function is WNL on current dose.  No current changes needed.  Lab Results  Component Value Date   TSH 0.79 01/24/2013

## 2014-02-22 NOTE — Assessment & Plan Note (Signed)
stable with salt supplementation and fluid restriction.

## 2014-02-23 ENCOUNTER — Telehealth: Payer: Self-pay | Admitting: Internal Medicine

## 2014-02-23 NOTE — Telephone Encounter (Signed)
Relevant patient education assigned to patient using Emmi. ° °

## 2014-02-24 ENCOUNTER — Other Ambulatory Visit: Payer: Self-pay | Admitting: Internal Medicine

## 2014-02-28 ENCOUNTER — Telehealth: Payer: Self-pay | Admitting: *Deleted

## 2014-02-28 NOTE — Telephone Encounter (Signed)
Yes, she can stop the clonidine morning dose and use labetalol 300 mg twice daily instead

## 2014-02-28 NOTE — Telephone Encounter (Signed)
Had stopped taking the clonidine in the morning. And was advised to take half, a tablet in the morning 98/54 the first day of taking 0.5 tablet . On  02/25/14 the blood pressure  Was 89 / 49 1.5 hours after taking morning clonidine. The Highest pressure has been 138/74 at 1.30 PM at 3,30 PM 174/78 . Patient wants to know can she stop the clonidine and start 300 mg of labetalol instead please advise.

## 2014-03-01 NOTE — Telephone Encounter (Signed)
Yes she can.

## 2014-03-01 NOTE — Telephone Encounter (Signed)
Left detailed message per patient request

## 2014-03-01 NOTE — Telephone Encounter (Signed)
Patient already taking twice daily she wanted to know could she increase to TID?

## 2014-03-07 DIAGNOSIS — I10 Essential (primary) hypertension: Secondary | ICD-10-CM | POA: Diagnosis not present

## 2014-03-07 DIAGNOSIS — E871 Hypo-osmolality and hyponatremia: Secondary | ICD-10-CM | POA: Diagnosis not present

## 2014-03-13 DIAGNOSIS — H2510 Age-related nuclear cataract, unspecified eye: Secondary | ICD-10-CM | POA: Diagnosis not present

## 2014-03-19 DIAGNOSIS — E871 Hypo-osmolality and hyponatremia: Secondary | ICD-10-CM | POA: Diagnosis not present

## 2014-03-19 DIAGNOSIS — I10 Essential (primary) hypertension: Secondary | ICD-10-CM | POA: Diagnosis not present

## 2014-03-21 ENCOUNTER — Other Ambulatory Visit: Payer: Self-pay | Admitting: *Deleted

## 2014-03-21 ENCOUNTER — Other Ambulatory Visit: Payer: Self-pay | Admitting: Internal Medicine

## 2014-04-09 DIAGNOSIS — E871 Hypo-osmolality and hyponatremia: Secondary | ICD-10-CM | POA: Diagnosis not present

## 2014-05-12 DIAGNOSIS — J209 Acute bronchitis, unspecified: Secondary | ICD-10-CM | POA: Diagnosis not present

## 2014-05-12 DIAGNOSIS — I10 Essential (primary) hypertension: Secondary | ICD-10-CM | POA: Diagnosis not present

## 2014-05-15 DIAGNOSIS — E871 Hypo-osmolality and hyponatremia: Secondary | ICD-10-CM | POA: Diagnosis not present

## 2014-05-26 ENCOUNTER — Other Ambulatory Visit: Payer: Self-pay | Admitting: Internal Medicine

## 2014-05-30 DIAGNOSIS — I10 Essential (primary) hypertension: Secondary | ICD-10-CM | POA: Diagnosis not present

## 2014-05-30 DIAGNOSIS — E871 Hypo-osmolality and hyponatremia: Secondary | ICD-10-CM | POA: Diagnosis not present

## 2014-06-14 DIAGNOSIS — E871 Hypo-osmolality and hyponatremia: Secondary | ICD-10-CM | POA: Diagnosis not present

## 2014-06-14 DIAGNOSIS — I10 Essential (primary) hypertension: Secondary | ICD-10-CM | POA: Diagnosis not present

## 2014-06-26 ENCOUNTER — Other Ambulatory Visit: Payer: Self-pay | Admitting: Internal Medicine

## 2014-08-16 ENCOUNTER — Other Ambulatory Visit: Payer: Self-pay | Admitting: Internal Medicine

## 2014-08-28 ENCOUNTER — Encounter: Payer: Self-pay | Admitting: Internal Medicine

## 2014-08-28 ENCOUNTER — Ambulatory Visit (INDEPENDENT_AMBULATORY_CARE_PROVIDER_SITE_OTHER): Payer: Medicare Other | Admitting: Internal Medicine

## 2014-08-28 VITALS — BP 148/88 | HR 69 | Temp 97.8°F | Resp 16 | Ht 60.0 in | Wt 139.5 lb

## 2014-08-28 DIAGNOSIS — D649 Anemia, unspecified: Secondary | ICD-10-CM

## 2014-08-28 DIAGNOSIS — G4700 Insomnia, unspecified: Secondary | ICD-10-CM

## 2014-08-28 DIAGNOSIS — E871 Hypo-osmolality and hyponatremia: Secondary | ICD-10-CM

## 2014-08-28 DIAGNOSIS — Z23 Encounter for immunization: Secondary | ICD-10-CM

## 2014-08-28 DIAGNOSIS — E038 Other specified hypothyroidism: Secondary | ICD-10-CM

## 2014-08-28 DIAGNOSIS — E785 Hyperlipidemia, unspecified: Secondary | ICD-10-CM | POA: Diagnosis not present

## 2014-08-28 DIAGNOSIS — E034 Atrophy of thyroid (acquired): Secondary | ICD-10-CM

## 2014-08-28 DIAGNOSIS — I10 Essential (primary) hypertension: Secondary | ICD-10-CM

## 2014-08-28 DIAGNOSIS — Z79899 Other long term (current) drug therapy: Secondary | ICD-10-CM | POA: Diagnosis not present

## 2014-08-28 DIAGNOSIS — E0789 Other specified disorders of thyroid: Secondary | ICD-10-CM

## 2014-08-28 DIAGNOSIS — F6089 Other specific personality disorders: Secondary | ICD-10-CM

## 2014-08-28 LAB — CBC WITH DIFFERENTIAL/PLATELET
BASOS PCT: 0.8 % (ref 0.0–3.0)
Basophils Absolute: 0.1 10*3/uL (ref 0.0–0.1)
EOS PCT: 3.1 % (ref 0.0–5.0)
Eosinophils Absolute: 0.2 10*3/uL (ref 0.0–0.7)
HEMATOCRIT: 39.3 % (ref 36.0–46.0)
Hemoglobin: 12.9 g/dL (ref 12.0–15.0)
Lymphocytes Relative: 39.6 % (ref 12.0–46.0)
Lymphs Abs: 2.8 10*3/uL (ref 0.7–4.0)
MCHC: 32.9 g/dL (ref 30.0–36.0)
MCV: 88.3 fl (ref 78.0–100.0)
MONO ABS: 0.8 10*3/uL (ref 0.1–1.0)
Monocytes Relative: 11.6 % (ref 3.0–12.0)
NEUTROS PCT: 44.9 % (ref 43.0–77.0)
Neutro Abs: 3.2 10*3/uL (ref 1.4–7.7)
Platelets: 339 10*3/uL (ref 150.0–400.0)
RBC: 4.45 Mil/uL (ref 3.87–5.11)
RDW: 14.4 % (ref 11.5–15.5)
WBC: 7.2 10*3/uL (ref 4.0–10.5)

## 2014-08-28 LAB — COMPREHENSIVE METABOLIC PANEL
ALK PHOS: 100 U/L (ref 39–117)
ALT: 17 U/L (ref 0–35)
AST: 25 U/L (ref 0–37)
Albumin: 4 g/dL (ref 3.5–5.2)
BUN: 13 mg/dL (ref 6–23)
CALCIUM: 9.6 mg/dL (ref 8.4–10.5)
CHLORIDE: 99 meq/L (ref 96–112)
CO2: 28 meq/L (ref 19–32)
Creatinine, Ser: 0.9 mg/dL (ref 0.4–1.2)
GFR: 62.08 mL/min (ref >60.00)
GLUCOSE: 88 mg/dL (ref 70–99)
POTASSIUM: 4.1 meq/L (ref 3.5–5.1)
Sodium: 133 mEq/L — ABNORMAL LOW (ref 135–145)
TOTAL PROTEIN: 7.2 g/dL (ref 6.0–8.3)
Total Bilirubin: 0.5 mg/dL (ref 0.2–1.2)

## 2014-08-28 LAB — LIPID PANEL
Cholesterol: 125 mg/dL (ref 0–200)
HDL: 43.2 mg/dL (ref >39.00)
LDL CALC: 54 mg/dL (ref 0–99)
NONHDL: 81.8
TRIGLYCERIDES: 141 mg/dL (ref 0.0–149.0)
Total CHOL/HDL Ratio: 3
VLDL: 28.2 mg/dL (ref 0.0–40.0)

## 2014-08-28 NOTE — Patient Instructions (Addendum)
I recommend getting the majority of your calcium and Vitamin D  through diet rather than supplements given the recent association of calcium supplements with increased coronary artery calcium scores (You need 1200 mg daily )   Unsweetened almond/coconut milk is a great low calorie low carb, cholesterol free  way to increase your dietary calcium and vitamin D.  Try the blue Jackquline Bosch  We are checking your cholesterol and hgb.   and liver tests today   Please have Robin send me the record when you get the Tdap and the Shingles  So I can document them.   You received the Prevnar vaccine today  I will refill you simvastatin once I see your labs from today

## 2014-08-28 NOTE — Progress Notes (Signed)
Pre-visit discussion using our clinic review tool. No additional management support is needed unless otherwise documented below in the visit note.  

## 2014-08-28 NOTE — Progress Notes (Signed)
Patient ID: Brittney Jackson, female   DOB: 01/28/1930, 80 y.o.   MRN: [id number]   Patient Active Problem List   Diagnosis Date Noted  . Hypertension 01/22/2012    Priority: High  . 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 Insomnia 08/30/2014  . Hyposmolality and/or hyponatremia 01/09/2014  . Routine general medical examination at a health care facility 01/24/2013  . Hyponatremia 07/23/2012  . Osteopenia 04/26/2012  . Hypothyroidism 01/22/2012  . Hyperlipidemia with target LDL less than 100 01/22/2012    Subjective:  CC:   Chief Complaint  Patient presents with  . Follow-up    6 month general visit for medication refills.  . Eye Problem    left lateral corner of eye.    HPI:   Brittney Jackson is a 80 y.o. female who presents for 6 month follow up on chronic conditions including hypertension , hyponatremia, COPD, OA and hypothyroidism.  She has been monitoring her BP at home and her bps have been < 130/80 consistently.  She has been splitting the labetalol and taking 150 mg bid, and taking losartan 100 mg daily.  COPD: Has been having a slight cough but states that she feels fine.  Does not use inhalers..  Does not wnt them.  States that in Feb during er  Hospitalization, she was administered symbicort which made her short of breath. She denies dsypnea unless she is  climbing multiple flights of stairs.  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   OA:  Left knee still problematic due to OA .  Has been  Symptomatic for the past 3 years ever since she went to [facility] for a nephew's raduation and had to walk over a mile to the stadium.  She has had a steroid injection in the knee which was helpful for a while.   This was 2 years ago,    History of cataracts .  Getting ready to have cataract checkup with [facility].  Going to see [place],   insomnia:   She has not been  sleeping well due to recurrent bad dreams involving family members in danger.  Attributes it to her medications .    Past Medical History  Diagnosis Date  . Osteoporosis   . Hypertension   . Hypothyroidism     No past surgical history on file.     The following portions of the patient's history were reviewed and updated as appropriate: Allergies, current medications, and problem list.    Review of Systems:   Patient denies headache, fevers, malaise, unintentional weight loss, skin rash, eye pain, sinus congestion and sinus pain, sore throat, dysphagia,  hemoptysis , cough, dyspnea, wheezing, chest pain, palpitations, orthopnea, edema, abdominal pain, nausea, melena, diarrhea, constipation, flank pain, dysuria, hematuria, urinary  Frequency, nocturia, numbness, tingling, seizures,  Focal weakness, Loss of consciousness,  Tremor, insomnia, depression, anxiety, and suicidal ideation.     History   Social History  . Marital Status: Widowed    Spouse Name: N/A    Number of Children: N/A  . Years of Education: N/A   Occupational History  . 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 Smoking status: Former Smoker    Types: Cigarettes    Quit date: 07/21/2000  . Smokeless tobacco: Never Used  . Alcohol Use: No  . Drug Use: No  .  Sexual Activity: Not on file   Other Topics Concern  . Not on file   Social History Narrative   Church activities, community service.   Lives alone.    Objective:  Filed Vitals:   08/28/14 1300  BP: 148/88  Pulse: 69  Temp: 97.8 F (36.6 C)  Resp: 16     General appearance: alert, cooperative and appears stated age Ears: normal TM's and external ear canals both ears Throat: lips, mucosa, and tongue normal; teeth and gums normal Neck: no adenopathy, no carotid bruit, supple, symmetrical, trachea midline and thyroid not enlarged, symmetric, no tenderness/mass/nodules Back: symmetric, no  curvature. ROM normal. No CVA tenderness. Lungs: clear to auscultation bilaterally Heart: regular rate and rhythm, S1, S2 normal, no murmur, click, rub or gallop Abdomen: soft, non-tender; bowel sounds normal; no masses,  no organomegaly Pulses: 2+ and symmetric Skin: Skin color, texture, turgor normal. No rashes or lesions Lymph nodes: Cervical, supraclavicular, and axillary nodes normal.  Assessment and Plan:  Hypertension Well controlled on current regimen.continue losartan once daily, labetalol twice daily and clonidine at 1/2 tablet bid.  She has had recent labs done by nephrology    Hypothyroidism Thyroid function was checked during [facility] hospitalization in Jan 2015 and normal.   Negativistic personality disorder Having known Ms Burggraf for over three years now,  I have become more comfortable with her negative attitude and understand her need to complain.   Insomnia reassurance provided that her medications are not likely the cause of her nightmares.   Hyponatremia Stable by repeat labs.  Lab Results  Component Value Date   NA 133* 08/28/2014   K 4.1 08/28/2014   CL 99 08/28/2014   CO2 28 08/28/2014     Hyperlipidemia with target LDL less than 100 LDL is at goal on current medications.  LFTS are due. Repeat both in 6 months   Lab Results  Component Value Date   CHOL 125 08/28/2014   HDL 43.20 08/28/2014   LDLCALC 54 08/28/2014   LDLDIRECT 67.0 01/24/2013   TRIG 141.0 08/28/2014   CHOLHDL 3 08/28/2014   Lab Results  Component Value Date   ALT 17 08/28/2014   AST 25 08/28/2014   ALKPHOS 100 08/28/2014   BILITOT 0.5 08/28/2014      A total of 40 minutes was spent with patient more than half of which was spent in counseling patient on the above mentioned issues , reviewing and explaining recent labs and imaging studies done, and coordination of care.  Updated Medication List Outpatient Encounter Prescriptions as of 08/28/2014  Medication Sig  . aspirin 81 MG tablet Take 81 mg by  mouth daily.  . budesonide-formoterol (SYMBICORT) 160-4.5 MCG/ACT inhaler Inhale 1 puff into the lungs 2 (two) times daily.  . cholecalciferol (VITAMIN D) 1000 UNITS tablet Take 1,000 Units by mouth daily.  . cloNIDine (CATAPRES) 0.1 MG tablet Take 0.1 mg by mouth daily.  Marland Kitchen labetalol (NORMODYNE) 300 MG tablet Take 1 tablet (300 mg total) by mouth 3 (three) times daily.  Marland Kitchen levothyroxine (SYNTHROID, LEVOTHROID) 88 MCG tablet TAKE ONE (1) TABLET BY MOUTH EVERY DAY  . losartan (COZAAR) 100 MG tablet TAKE ONE (1) TABLET EACH DAY  . simvastatin (ZOCOR) 40 MG tablet TAKE ONE TABLET BY MOUTH EVERY NIGHT AT BEDTIME  . Oral Electrolytes (BUFFERED SALT) TABS Take 1 tablet by mouth 3 (three) times daily with meals.  . TDaP (BOOSTRIX) 5-2.5-18.5 LF-MCG/0.5 injection Inject 0.5 mLs into the muscle once.  . zoster vaccine  live, PF, (ZOSTAVAX) 74944 UNT/0.65ML injection Inject 19,400 Units into the skin once.  . [DISCONTINUED] levofloxacin (LEVAQUIN) 250 MG tablet Take 1 tablet (250 mg total) by mouth daily.  . [DISCONTINUED] pantoprazole (PROTONIX) 40 MG tablet Take 40 mg by mouth daily.     Orders Placed This Encounter  Procedures  . Pneumococcal conjugate vaccine 13-valent  . Comprehensive metabolic panel  . CBC with Differential  . Lipid panel  . Ferritin  . Iron and TIBC    Return in about 6 months (around 02/26/2015).

## 2014-08-29 LAB — FERRITIN: FERRITIN: 21.2 ng/mL (ref 10.0–291.0)

## 2014-08-29 LAB — IRON AND TIBC
%SAT: 22 % (ref 20–55)
Iron: 81 ug/dL (ref 42–145)
TIBC: 375 ug/dL (ref 250–470)
UIBC: 294 ug/dL (ref 125–400)

## 2014-08-30 DIAGNOSIS — G4700 Insomnia, unspecified: Secondary | ICD-10-CM | POA: Insufficient documentation

## 2014-08-30 DIAGNOSIS — F6089 Other specific personality disorders: Secondary | ICD-10-CM | POA: Insufficient documentation

## 2014-08-30 NOTE — Assessment & Plan Note (Signed)
Thyroid function was checked during [facility] hospitalization in Jan 2015 and normal.

## 2014-08-30 NOTE — Assessment & Plan Note (Signed)
reassurance provided that her medications are not likely the cause of her nightmares.

## 2014-08-30 NOTE — Assessment & Plan Note (Addendum)
Stable by repeat labs.  Lab Results  Component Value Date   NA 133* 08/28/2014   K 4.1 08/28/2014   CL 99 08/28/2014   CO2 28 08/28/2014

## 2014-08-30 NOTE — Assessment & Plan Note (Signed)
Having known Ms Brittney Jackson for over three years now,  I have become more comfortable with her negative attitude and understand her need to complain.

## 2014-08-30 NOTE — Assessment & Plan Note (Signed)
Well controlled on current regimen.continue losartan once daily, labetalol twice daily and clonidine at 1/2 tablet bid.  She has had recent labs done by nephrology

## 2014-08-30 NOTE — Assessment & Plan Note (Signed)
LDL is at goal on current medications.  LFTS are due. Repeat both in 6 months   Lab Results  Component Value Date   CHOL 125 08/28/2014   HDL 43.20 08/28/2014   LDLCALC 54 08/28/2014   LDLDIRECT 67.0 01/24/2013   TRIG 141.0 08/28/2014   CHOLHDL 3 08/28/2014   Lab Results  Component Value Date   ALT 17 08/28/2014   AST 25 08/28/2014   ALKPHOS 100 08/28/2014   BILITOT 0.5 08/28/2014

## 2014-08-31 ENCOUNTER — Encounter: Payer: Self-pay | Admitting: Internal Medicine

## 2014-09-11 ENCOUNTER — Other Ambulatory Visit: Payer: Self-pay | Admitting: Internal Medicine

## 2014-10-02 DIAGNOSIS — N183 Chronic kidney disease, stage 3 (moderate): Secondary | ICD-10-CM | POA: Diagnosis not present

## 2014-10-02 DIAGNOSIS — E871 Hypo-osmolality and hyponatremia: Secondary | ICD-10-CM | POA: Diagnosis not present

## 2014-10-02 DIAGNOSIS — I10 Essential (primary) hypertension: Secondary | ICD-10-CM | POA: Diagnosis not present

## 2014-10-18 DIAGNOSIS — N183 Chronic kidney disease, stage 3 (moderate): Secondary | ICD-10-CM | POA: Diagnosis not present

## 2014-12-18 ENCOUNTER — Other Ambulatory Visit: Payer: Self-pay | Admitting: Internal Medicine

## 2014-12-27 ENCOUNTER — Encounter: Payer: Self-pay | Admitting: *Deleted

## 2015-01-10 NOTE — Telephone Encounter (Signed)
Mailed unread message to pt  

## 2015-01-17 DIAGNOSIS — N183 Chronic kidney disease, stage 3 (moderate): Secondary | ICD-10-CM | POA: Diagnosis not present

## 2015-01-17 DIAGNOSIS — E871 Hypo-osmolality and hyponatremia: Secondary | ICD-10-CM | POA: Diagnosis not present

## 2015-01-17 DIAGNOSIS — I10 Essential (primary) hypertension: Secondary | ICD-10-CM | POA: Diagnosis not present

## 2015-02-27 ENCOUNTER — Encounter: Payer: Self-pay | Admitting: Internal Medicine

## 2015-02-27 ENCOUNTER — Ambulatory Visit (INDEPENDENT_AMBULATORY_CARE_PROVIDER_SITE_OTHER): Payer: Medicare Other | Admitting: Internal Medicine

## 2015-02-27 VITALS — BP 132/73 | HR 81 | Temp 97.5°F | Resp 16 | Ht 59.75 in | Wt 144.5 lb

## 2015-02-27 DIAGNOSIS — E038 Other specified hypothyroidism: Secondary | ICD-10-CM | POA: Diagnosis not present

## 2015-02-27 DIAGNOSIS — Z0000 Encounter for general adult medical examination without abnormal findings: Secondary | ICD-10-CM | POA: Diagnosis not present

## 2015-02-27 DIAGNOSIS — E871 Hypo-osmolality and hyponatremia: Secondary | ICD-10-CM

## 2015-02-27 DIAGNOSIS — Z1211 Encounter for screening for malignant neoplasm of colon: Secondary | ICD-10-CM

## 2015-02-27 DIAGNOSIS — I10 Essential (primary) hypertension: Secondary | ICD-10-CM

## 2015-02-27 DIAGNOSIS — Z79899 Other long term (current) drug therapy: Secondary | ICD-10-CM

## 2015-02-27 DIAGNOSIS — E034 Atrophy of thyroid (acquired): Secondary | ICD-10-CM | POA: Diagnosis not present

## 2015-02-27 DIAGNOSIS — Z1239 Encounter for other screening for malignant neoplasm of breast: Secondary | ICD-10-CM

## 2015-02-27 LAB — TSH: TSH: 1.44 u[IU]/mL (ref 0.35–4.50)

## 2015-02-27 LAB — COMPREHENSIVE METABOLIC PANEL
ALBUMIN: 4.2 g/dL (ref 3.5–5.2)
ALT: 15 U/L (ref 0–35)
AST: 19 U/L (ref 0–37)
Alkaline Phosphatase: 119 U/L — ABNORMAL HIGH (ref 39–117)
BUN: 18 mg/dL (ref 6–23)
CO2: 32 mEq/L (ref 19–32)
Calcium: 9.8 mg/dL (ref 8.4–10.5)
Chloride: 96 mEq/L (ref 96–112)
Creatinine, Ser: 0.94 mg/dL (ref 0.40–1.20)
GFR: 60.48 mL/min (ref >60.00)
Glucose, Bld: 91 mg/dL (ref 70–99)
POTASSIUM: 4 meq/L (ref 3.5–5.1)
SODIUM: 133 meq/L — AB (ref 135–145)
Total Bilirubin: 0.4 mg/dL (ref 0.2–1.2)
Total Protein: 6.8 g/dL (ref 6.0–8.3)

## 2015-02-27 NOTE — Progress Notes (Signed)
Patient ID: Brittney Jackson, female   DOB: 12-28-1930, 81 y.o.   MRN: [id number]  The patient is here for annual Medicare wellness examination and management of other chronic and acute problems.   The risk factors are reflected in the social history.  The roster of all physicians providing medical care to patient - is listed in the Snapshot section of the chart.  Activities of daily living:  The patient is 100% independent in all ADLs: dressing, toileting, feeding as well as independent mobility  Home safety : The patient has smoke detectors in the home. They wear seatbelts.  There are no firearms at home. There is no violence in the home.   There is no risks for hepatitis, STDs or HIV. There is no   history of blood transfusion. They have no travel history to infectious disease endemic areas of the world.  The patient has seen their dentist in the last six month. They have seen their eye doctor in the last year. They admit to slight hearing difficulty with regard to whispered voices and some television programs.  They have deferred audiologic testing in the last year.  They do not  have excessive sun exposure. Discussed the need for sun protection: hats, long sleeves and use of sunscreen if there is significant sun exposure.   Diet: the importance of a healthy diet is discussed. They do have a healthy diet.   The benefits of regular aerobic exercise were discussed. She walks  5 times per week ,  30  minutes.   Depression screen: there are no signs or vegative symptoms of depression- irritability, change in appetite, anhedonia, sadness/tearfullness.  Cognitive assessment: the patient manages all their financial and personal affairs and is actively engaged. They could relate day,date,year and events; recalled 2/3 objects at 3 minutes; performed clock-face test normally.  The following portions of the patient's history were reviewed and updated as appropriate: allergies, current medications, past  family history, past medical history,  past surgical history, past social history  and problem list.  Visual acuity was not assessed per patient preference since she has regular follow up with her ophthalmologist. Hearing and body mass index were assessed and reviewed.   During the course of the visit the patient was educated and counseled about appropriate screening and preventive services including : fall prevention , diabetes screening, nutrition counseling, colorectal cancer screening, and recommended immunizations.    Gets acupuncture every 3 weeks by Dr Noel Gerold in [facility] )  Review of Systems:  Patient denies headache, fevers, malaise, unintentional weight loss, skin rash, eye pain, sinus congestion and sinus pain, sore throat, dysphagia,  hemoptysis , cough, dyspnea, wheezing, chest pain, palpitations, orthopnea, edema, abdominal pain, nausea, melena, diarrhea, constipation, flank pain, dysuria, hematuria, urinary  Frequency, nocturia, numbness, tingling, seizures,  Focal weakness, Loss of consciousness,  Tremor, insomnia, depression, anxiety, and suicidal ideation.    Objective:  BP 132/73 mmHg  Pulse 81  Temp(Src) 97.5 F (36.4 C) (Oral)  Resp 16  Ht 4' 11.75" (1.518 m)  Wt 144 lb 8 oz (65.545 kg)  BMI 28.44 kg/m2  SpO2 98%  General appearance: alert, cooperative and appears stated age Ears: normal TM's and external ear canals both ears Throat: lips, mucosa, and tongue normal; teeth and gums normal Neck: no adenopathy, no carotid bruit, supple, symmetrical, trachea midline and thyroid not enlarged, symmetric, no tenderness/mass/nodules Back: symmetric, no curvature. ROM normal. No CVA tenderness. Lungs: clear to auscultation bilaterally Heart: regular rate and  rhythm, S1, S2 normal, no murmur, click, rub or gallop Abdomen: soft, non-tender; bowel sounds normal; no masses,  no organomegaly Pulses: 2+ and symmetric Skin: Skin color, texture, turgor normal. No  rashes or lesions Lymph nodes: Cervical, supraclavicular, and axillary nodes normal.  Assessment and Plan:  Problem List Items Addressed This Visit    Routine general medical examination at a health care facility    Annual Medicare wellness  exam was done as well as a comprehensive physical exam and management of acute and chronic conditions .  During the course of the visit the patient was educated and counseled about appropriate screening and preventive services including : fall prevention , diabetes screening, nutrition counseling, colorectal cancer screening, and recommended immunizations.  Printed recommendations for health maintenance screenings was given.        Hypothyroidism    Thyroid function is WNL on current dose.  No current changes needed.   Lab Results  Component Value Date   TSH 1.44 02/27/2015          Relevant Orders   TSH (Completed)   Hyposmolality and/or hyponatremia    Stable, by repeat testing. No changes today .   Lab Results  Component Value Date   NA 133* 02/27/2015   K 4.0 02/27/2015   CL 96 02/27/2015   CO2 32 02/27/2015         Hypertension    Home Bps have been well controlled. No changes today        Other Visit Diagnoses    Long-term use of high-risk medication    -  Primary    Relevant Orders    Comp Met (CMET) (Completed)    Colon cancer screening        Relevant Orders    Fecal occult blood, imunochemical    Breast cancer screening        Relevant Orders    MM DIGITAL SCREENING BILATERAL

## 2015-02-27 NOTE — Progress Notes (Signed)
Pre-visit discussion using our clinic review tool. No additional management support is needed unless otherwise documented below in the visit note.  

## 2015-02-27 NOTE — Patient Instructions (Addendum)
If you decide you would like me  to order a mammogram ,  Please let  me know  The home stool card is to check for blood in stool Health Maintenance Adopting a healthy lifestyle and getting preventive care can go a long way to promote health and wellness. Talk with your health care provider about what schedule of regular examinations is right for you. This is a good chance for you to check in with your provider about disease prevention and staying healthy. In between checkups, there are plenty of things you can do on your own. Experts have done a lot of research about which lifestyle changes and preventive measures are most likely to keep you healthy. Ask your health care provider for more information. WEIGHT AND DIET  Eat a healthy diet  Be sure to include plenty of vegetables, fruits, low-fat dairy products, and lean protein.  Do not eat a lot of foods high in solid fats, added sugars, or salt.  Get regular exercise. This is one of the most important things you can do for your health.  Most adults should exercise for at least 150 minutes each week. The exercise should increase your heart rate and make you sweat (moderate-intensity exercise).  Most adults should also do strengthening exercises at least twice a week. This is in addition to the moderate-intensity exercise.  Maintain a healthy weight  Body mass index (BMI) is a measurement that can be used to identify possible weight problems. It estimates body fat based on height and weight. Your health care provider can help determine your BMI and help you achieve or maintain a healthy weight.  For females 67 years of age and older:   A BMI below 18.5 is considered underweight.  A BMI of 18.5 to 24.9 is normal.  A BMI of 25 to 29.9 is considered overweight.  A BMI of 30 and above is considered obese.  Watch levels of cholesterol and blood lipids  You should start having your blood tested for lipids and cholesterol at 79 years of  age, then have this test every 5 years.  You may need to have your cholesterol levels checked more often if:  Your lipid or cholesterol levels are high.  You are older than 79 years of age.  You are at high risk for heart disease.  CANCER SCREENING   Lung Cancer  Lung cancer screening is recommended for adults 50-28 years old who are at high risk for lung cancer because of a history of smoking.  A yearly low-dose CT scan of the lungs is recommended for people who:  Currently smoke.  Have quit within the past 15 years.  Have at least a 30-pack-year history of smoking. A pack year is smoking an average of one pack of cigarettes a day for 1 year.  Yearly screening should continue until it has been 15 years since you quit.  Yearly screening should stop if you develop a health problem that would prevent you from having lung cancer treatment.  Breast Cancer  Practice breast self-awareness. This means understanding how your breasts normally appear and feel.  It also means doing regular breast self-exams. Let your health care provider know about any changes, no matter how small.  If you are in your 20s or 30s, you should have a clinical breast exam (CBE) by a health care provider every 1-3 years as part of a regular health exam.  If you are 59 or older, have a CBE every year. Also  consider having a breast X-ray (mammogram) every year.  If you have a family history of breast cancer, talk to your health care provider about genetic screening.  If you are at high risk for breast cancer, talk to your health care provider about having an MRI and a mammogram every year.  Breast cancer gene (BRCA) assessment is recommended for women who have family members with BRCA-related cancers. BRCA-related cancers include:  Breast.  Ovarian.  Tubal.  Peritoneal cancers.  Results of the assessment will determine the need for genetic counseling and BRCA1 and BRCA2 testing. Cervical  Cancer Routine pelvic examinations to screen for cervical cancer are no longer recommended for nonpregnant women who are considered low risk for cancer of the pelvic organs (ovaries, uterus, and vagina) and who do not have symptoms. A pelvic examination may be necessary if you have symptoms including those associated with pelvic infections. Ask your health care provider if a screening pelvic exam is right for you.   The Pap test is the screening test for cervical cancer for women who are considered at risk.  If you had a hysterectomy for a problem that was not cancer or a condition that could lead to cancer, then you no longer need Pap tests.  If you are older than 65 years, and you have had normal Pap tests for the past 10 years, you no longer need to have Pap tests.  If you have had past treatment for cervical cancer or a condition that could lead to cancer, you need Pap tests and screening for cancer for at least 20 years after your treatment.  If you no longer get a Pap test, assess your risk factors if they change (such as having a new sexual partner). This can affect whether you should start being screened again.  Some women have medical problems that increase their chance of getting cervical cancer. If this is the case for you, your health care provider may recommend more frequent screening and Pap tests.  The human papillomavirus (HPV) test is another test that may be used for cervical cancer screening. The HPV test looks for the virus that can cause cell changes in the cervix. The cells collected during the Pap test can be tested for HPV.  The HPV test can be used to screen women 69 years of age and older. Getting tested for HPV can extend the interval between normal Pap tests from three to five years.  An HPV test also should be used to screen women of any age who have unclear Pap test results.  After 79 years of age, women should have HPV testing as often as Pap tests.  Colorectal  Cancer  This type of cancer can be detected and often prevented.  Routine colorectal cancer screening usually begins at 79 years of age and continues through 79 years of age.  Your health care provider may recommend screening at an earlier age if you have risk factors for colon cancer.  Your health care provider may also recommend using home test kits to check for hidden blood in the stool.  A small camera at the end of a tube can be used to examine your colon directly (sigmoidoscopy or colonoscopy). This is done to check for the earliest forms of colorectal cancer.  Routine screening usually begins at age 47.  Direct examination of the colon should be repeated every 5-10 years through 79 years of age. However, you may need to be screened more often if early forms of  precancerous polyps or small growths are found. Skin Cancer  Check your skin from head to toe regularly.  Tell your health care provider about any new moles or changes in moles, especially if there is a change in a mole's shape or color.  Also tell your health care provider if you have a mole that is larger than the size of a pencil eraser.  Always use sunscreen. Apply sunscreen liberally and repeatedly throughout the day.  Protect yourself by wearing long sleeves, pants, a wide-brimmed hat, and sunglasses whenever you are outside. HEART DISEASE, DIABETES, AND HIGH BLOOD PRESSURE   Have your blood pressure checked at least every 1-2 years. High blood pressure causes heart disease and increases the risk of stroke.  If you are between 65 years and 46 years old, ask your health care provider if you should take aspirin to prevent strokes.  Have regular diabetes screenings. This involves taking a blood sample to check your fasting blood sugar level.  If you are at a normal weight and have a low risk for diabetes, have this test once every three years after 79 years of age.  If you are overweight and have a high risk for  diabetes, consider being tested at a younger age or more often. PREVENTING INFECTION  Hepatitis B  If you have a higher risk for hepatitis B, you should be screened for this virus. You are considered at high risk for hepatitis B if:  You were born in a country where hepatitis B is common. Ask your health care provider which countries are considered high risk.  Your parents were born in a high-risk country, and you have not been immunized against hepatitis B (hepatitis B vaccine).  You have HIV or AIDS.  You use needles to inject street drugs.  You live with someone who has hepatitis B.  You have had sex with someone who has hepatitis B.  You get hemodialysis treatment.  You take certain medicines for conditions, including cancer, organ transplantation, and autoimmune conditions. Hepatitis C  Blood testing is recommended for:  Everyone born from [birth year] through 1963.  Anyone with known risk factors for hepatitis C. Sexually transmitted infections (STIs)  You should be screened for sexually transmitted infections (STIs) including gonorrhea and chlamydia if:  You are sexually active and are younger than 79 years of age.  You are older than 79 years of age and your health care provider tells you that you are at risk for this type of infection.  Your sexual activity has changed since you were last screened and you are at an increased risk for chlamydia or gonorrhea. Ask your health care provider if you are at risk.  If you do not have HIV, but are at risk, it may be recommended that you take a prescription medicine daily to prevent HIV infection. This is called pre-exposure prophylaxis (PrEP). You are considered at risk if:  You are sexually active and do not regularly use condoms or know the HIV status of your partner(s).  You take drugs by injection.  You are sexually active with a partner who has HIV. Talk with your health care provider about whether you are at high risk of  being infected with HIV. If you choose to begin PrEP, you should first be tested for HIV. You should then be tested every 3 months for as long as you are taking PrEP.  PREGNANCY   If you are premenopausal and you may become pregnant, ask your health care  provider about preconception counseling.  If you may become pregnant, take 400 to 800 micrograms (mcg) of folic acid every day.  If you want to prevent pregnancy, talk to your health care provider about birth control (contraception). OSTEOPOROSIS AND MENOPAUSE   Osteoporosis is a disease in which the bones lose minerals and strength with aging. This can result in serious bone fractures. Your risk for osteoporosis can be identified using a bone density scan.  If you are 83 years of age or older, or if you are at risk for osteoporosis and fractures, ask your health care provider if you should be screened.  Ask your health care provider whether you should take a calcium or vitamin D supplement to lower your risk for osteoporosis.  Menopause may have certain physical symptoms and risks.  Hormone replacement therapy may reduce some of these symptoms and risks. Talk to your health care provider about whether hormone replacement therapy is right for you.  HOME CARE INSTRUCTIONS   Schedule regular health, dental, and eye exams.  Stay current with your immunizations.   Do not use any tobacco products including cigarettes, chewing tobacco, or electronic cigarettes.  If you are pregnant, do not drink alcohol.  If you are breastfeeding, limit how much and how often you drink alcohol.  Limit alcohol intake to no more than 1 drink per day for nonpregnant women. One drink equals 12 ounces of beer, 5 ounces of wine, or 1 ounces of hard liquor.  Do not use street drugs.  Do not share needles.  Ask your health care provider for help if you need support or information about quitting drugs.  Tell your health care provider if you often feel  depressed.  Tell your health care provider if you have ever been abused or do not feel safe at home. Document Released: 06/29/2011 Document Revised: 04/30/2014 Document Reviewed: 11/15/2013 [facility] Patient Information 2015 [place], [place]. This information is not intended to replace advice given to you by your health care provider. Make sure you discuss any questions you have with your health care provider.

## 2015-03-01 ENCOUNTER — Encounter: Payer: Self-pay | Admitting: Internal Medicine

## 2015-03-02 ENCOUNTER — Encounter: Payer: Self-pay | Admitting: Internal Medicine

## 2015-03-02 NOTE — Assessment & Plan Note (Signed)
Thyroid function is WNL on current dose.  No current changes needed.   Lab Results  Component Value Date   TSH 1.44 02/27/2015

## 2015-03-02 NOTE — Assessment & Plan Note (Signed)
Stable, by repeat testing. No changes today .   Lab Results  Component Value Date   NA 133* 02/27/2015   K 4.0 02/27/2015   CL 96 02/27/2015   CO2 32 02/27/2015

## 2015-03-02 NOTE — Assessment & Plan Note (Addendum)

## 2015-03-02 NOTE — Assessment & Plan Note (Signed)
Home Bps have been well controlled. No changes today

## 2015-03-07 ENCOUNTER — Other Ambulatory Visit: Payer: Self-pay | Admitting: Internal Medicine

## 2015-03-21 ENCOUNTER — Ambulatory Visit: Payer: Self-pay | Admitting: Internal Medicine

## 2015-03-21 DIAGNOSIS — Z1231 Encounter for screening mammogram for malignant neoplasm of breast: Secondary | ICD-10-CM | POA: Diagnosis not present

## 2015-03-21 LAB — HM MAMMOGRAPHY: HM MAMMO: NEGATIVE

## 2015-03-21 NOTE — Telephone Encounter (Signed)
Mailed unread message to pt  

## 2015-03-25 ENCOUNTER — Encounter: Payer: Self-pay | Admitting: Internal Medicine

## 2015-03-26 ENCOUNTER — Other Ambulatory Visit (INDEPENDENT_AMBULATORY_CARE_PROVIDER_SITE_OTHER): Payer: Medicare Other

## 2015-03-26 DIAGNOSIS — Z1211 Encounter for screening for malignant neoplasm of colon: Secondary | ICD-10-CM

## 2015-03-26 LAB — FECAL OCCULT BLOOD, IMMUNOCHEMICAL: FECAL OCCULT BLD: NEGATIVE

## 2015-03-31 ENCOUNTER — Encounter: Payer: Self-pay | Admitting: Internal Medicine

## 2015-04-18 DIAGNOSIS — H903 Sensorineural hearing loss, bilateral: Secondary | ICD-10-CM | POA: Diagnosis not present

## 2015-04-18 DIAGNOSIS — H6121 Impacted cerumen, right ear: Secondary | ICD-10-CM | POA: Diagnosis not present

## 2015-04-20 NOTE — Discharge Summary (Signed)
PATIENT NAME:  Brittney Jackson, Brittney Jackson MR#:  [id number] DATE OF BIRTH:  12-26-1930  DATE OF ADMISSION:  12/28/2013 DATE OF DISCHARGE:  01/05/2014  REASON FOR ADMISSION:  The patient consulted for high blood pressure, but she was found to have significant hyponatremia.   DISCHARGE DIAGNOSES: 1.  Chronic obstructive pulmonary disease exacerbation.  2.  Acute bronchitis.  3.  Reticulonodular densities on lower lobes of the lungs, likely granulomatous disease. Suggested follow up with CT in 6 to 8 weeks.  4.  Malignant hypertension, uncontrolled.  5.  Hyponatremia.  6.  Generalized weakness.  7.  Hypothyroidism.  8.  Hyperlipidemia.  9.  Urinary tract infection ruled out, so no urinary tract infection.   DISPOSITION: Home.   DIET: Regular diet. Normal sodium intake with 1000 mL of fluids in 24 hours.   MEDICATIONS AT DISCHARGE: Levothyroxine 88 mcg once a day, simvastatin 40 mg once a day, aspirin 81 mg daily, calcium 500 mg daily, vitamin D3, 2000 units once a day; budesonide with formoterol 2 times daily, tiotropium  18 mcg once daily, sodium chloride 100 mg 3 times daily, losartan 100 mg once a day, clonidine 0.1 mg twice daily, labetalol 150 mg twice daily.   FOLLOWUP: With Dr. Holley Raring in 7 days to recheck his sodium levels, and Dr. Deborra Medina in  1 to 2 weeks to recheck sodium levels and monitor blood pressure.   RECOMMENDATIONS:  The patient to check blood pressure every day twice daily at least; write it down and bring it to Dr. Derrel Nip for evaluation.   HOSPITAL COURSE: This is a very nice 81 year old female with history of hypertension, chronic obstructive pulmonary disease, although undiagnosed; hypothyroidism, hyperlipidemia, vitamin D deficiency, who comes on 12/28/2013, with a chief complaint of having high blood pressure.   The patient has been having some cough and exacerbation of acute on chronic bronchitis, which  was going on for a week. She had nonproductive cough without fevers or  any other medical problems.   The patient has been taking cold medications without any significant relief. She has been checking her blood pressure at home and her sphygmomanometer has marked the 200 mark.   The patient was brought to the Emergency Department where she was found to havelow sodium Overall, the patient was admitted for correction of this problem. She was put on IV fluids. Nephrology was consulted. It seemed to be like the patient was dehydrated, not eating anything solid mostly drinking clear fluids, which might have washed down her sodium.  That is just the main opinion of nephrology. The patient does have some changes in her CT scan that could be related to granulomatous disease, but not significant changes of a possible cancer or anything like that actually.  She was seen by Dr. Raul Del and he was not impressed with this finding.   The patient needs to follow up on this CT scan in 6 to 8 weeks for which we recommend Dr. Derrel Nip to schedule outpatient.   The patient had some IV fluids during this hospitalization and then Lasix due to possible fluid overload, although it was more likely secondary to her COPD exacerbation. Dr. Holley Raring put the patient on 3%, and her sodium started improving up to reaching the 130s. Since the 3% was removed, the sodium level has been stable without dropping significantly, and the patient has been put on a water restriction of 1000 mL and sodium pills.   At discharge, her BUN is 20, which mostly indicates that the  patient is a little bit dry. We asked her to drink the full 1000 mL and no less than that.   Her hemoglobin is 11.1. Her blood pressure has been high, uncontrolled in the 160s to 170s. Labetalol was increased from 100 twice daily to 150 twice daily. Her heart rate is in the 70s for the most. Her blood pressure now is reaching 035 to 009 systolic; diastolics in the 38H.   The patient looks good. She looks hydrated. She is here in the company of her  daughter, and she is going to be discharged today in good condition.   DISPOSITION: Home with home health.   I spent about 45 minutes on this discharge today.   I recommended home health with physical therapy.    ____________________________ [place] Sink, MD rsg:dmm D: 01/05/2014 16:11:16 ET T: 01/05/2014 20:10:44 ET JOB#: [id number]  cc: Claiborne Sink, MD, <Dictator> Deborra Medina, MD [place] Lilian Kapur, MD Cristi Loron MD ELECTRONICALLY SIGNED 01/21/2014 8:16

## 2015-04-20 NOTE — H&P (Signed)
PATIENT NAME:  Brittney Jackson, Brittney Jackson MR#:  [id number] DATE OF BIRTH:  1930-03-17  DATE OF ADMISSION:  12/28/2013  REFERRING PHYSICIAN: Dr. Dahlia Client.   PRIMARY CARE PHYSICIAN: Dr. Derrel Nip.   CHIEF COMPLAINT: High blood pressure.   HISTORY OF PRESENT ILLNESS: This is an 81 year old Caucasian female with past medical history of hypertension, hypothyroidism, who is presenting with high blood pressure. Apparently, she has been having a cough for about one week duration, which she describes as nonproductive without associated fevers, chills or chest pain. No orthopnea, edema or PND. She has been taking over-the-counter cough medication Delsym without any relief in symptoms. She has been feeling generalized fatigue and weakness with decreased p.o. intake. Because of this, she decided to take her blood pressure and noticed it to be elevated reading only high on her sphygmomanometer.  Her daughter came over and repeated that her systolic blood pressure was in the 200s. En route to the Emergency Department, blood pressure was similarly elevated and basic workup found to have the sodium of 118. Currently, she is without complaints.   REVIEW OF SYSTEMS:  CONSTITUTIONAL: Positive for generalized fatigue, weakness, however, denies any fevers or pain.  EYES: Denies blurred vision, double vision, eye pain.  EARS, NOSE, THROAT: Denies tinnitus, ear pain, hearing loss.  RESPIRATORY: Positive for productive cough as described above. However, denies any wheeze or shortness of breath.  CARDIOVASCULAR: Denies chest pain, palpitations, edema.  GASTROINTESTINAL: Denies nausea, vomiting, positive for nausea. However, denies any vomiting, diarrhea or abdominal pain.  GENITOURINARY: Denies dysuria, hematuria.  ENDOCRINE: Denies any nocturia or thyroid problems. HEMATOLOGIC: Denies easy bruising or bleeding.  SKIN: Denies rash or lesions.  MUSCULOSKELETAL: Denies pain in neck, back, shoulder, knees, hips, arthritic symptoms.    NEUROLOGIC: Denies paralysis, paresthesias or headache.  PSYCHIATRIC: Denies anxiety or depressive symptoms.  Otherwise, full review of systems performed by me is negative.   PAST MEDICAL HISTORY: Hypertension, hypothyroidism, hyperlipidemia and vitamin D deficiency.   FAMIY HISTORY:  Positive for hypertension.   SOCIAL HISTORY: Remote history of tobacco usage, has not smoked for greater than 15 years. Denies any alcohol or drug usage.   ALLERGIES: No known drug allergies.   HOME MEDICATIONS: Include aspirin 81 mg p.o. daily, calcium 500 mg daily, Synthroid 88 mcg p.o. daily, lisinopril 40 mg p.o. b.i.d., losartan 100 mg p.o. daily, simvastatin 40 mg p.o. at bedtime, vitamin D3 2000 international units p.o. daily.   PHYSICAL EXAMINATION: VITAL SIGNS: Temperature 97.6, heart rate 89, respirations 20, blood pressure on arrival 241/102. Current blood pressure 172/75, saturating 93% on room air.  GENERAL: Well-nourished, well-developed, Caucasian female, currently in no acute distress.  HEAD: Normocephalic, atraumatic.  EYES: Pupils equal, round, extraocular muscles intact. No sclerae icterus.  MOUTH: Moist mucosal membranes. Dentition intact. No abscess noted.  EARS, NOSE AND THROAT: Clear without exudates. No external lesions.  NECK: Supple. No thyromegaly. No nodules. No JVD.  PULMONARY: Scant left lower lobe rhonchi with good air entry bilaterally. No use of accessory muscles. Good respiratory effort.  CHEST: Nontender to palpation.  CARDIOVASCULAR: S1, S2, regular rate and rhythm. No murmurs, rubs or gallops. No edema. Pedal pulses 2+ bilaterally.  GASTROINTESTINAL: Soft, nontender, nondistended. No masses. Positive bowel sounds. No hepatosplenomegaly.  MUSCULOSKELETAL: No swelling, clubbing or edema. Range of motion full in all extremities.   NEUROLOGIC: Cranial nerves II through XII intact. No gross neurological deficits. Sensation intact. Reflexes intact.  SKIN: 10x10cm sacral  decub stage II no further, lesions, rashes, cyanosis. Skin warm,  dry. Turgor is intact.  PSYCHIATRIC: Mood and affect within normal limits. The patient is awake, alert and oriented x 3. Insight and judgment intact.   LABORATORY DATA: Sodium 118, potassium 3.8, chloride 87, bicarb 26, BUN 9, creatinine 0.68, glucose 125, total protein 7.4, albumin 3.6, bilirubin 0.4, alkaline phosphatase 134, AST 35, ALT 28. WBC 7, hemoglobin 14, platelets 258. Chest x-ray performed revealing no acute cardiopulmonary process.   ASSESSMENT AND PLAN: An 81 year old Caucasian female with history of hypertension and hypothyroidism, presented with high blood pressure. 1.  Hyponatremia. She has been given a  liter of normal saline thus far in the Emergency Department. She has a 313 mEq sodium deficit to correct her sodium from 118 to 128 with completion of her liter of sodium,  we will decrease normal saline to 40 mL an hour, which would slightly under-correct her in the next 24 hours. Will check urine sodium and osmolality, thus suspect there may be a component of syndrome of inappropriate antidiuretic hormone given, as she appears euvolemic and does have some sort of pulmonic process going on. We will also be checking a TSH as this could also contribute 2.  Hypertension. Continue her home medications including only lisinopril and losartan, question slightly why she is on both these medications. There is a urinalysis pending at this time. She may be on this for proteinuria. Regardless, will add hydralazine 10 mg IV q.4 hours p.r.n. systolic blood pressure greater than 559 or diastolic greater than 741. Currently, her blood pressure has decreased without further intervention, though suspect after giving IV fluids it will elevate once again.  3.  Cough.  No evidence of pneumonia on chest x-rays, labs or exam.  Supportive care at this time. Will add a flutter valve to aid in her cough as well as Robitussin.  4.   Hypothyroidism.  Will check a TSH and continue home dosage of Synthroid.  5.   Deep venous thrombosis prophylaxis. Heparin subcutaneous.   6.   CODE STATUS: The patient is full code.  7. sacral decub stage II present on admission: wound care  TIME SPENT: 45 minutes    ____________________________ Aaron Mose. Cyana Shook, MD dkh:NTS D: 12/28/2013 00:58:10 ET T: 12/28/2013 01:09:32 ET JOB#: [id number]  cc: Aaron Mose. Gavyn Zoss, MD, <Dictator> Kimbra Marcelino Woodfin Ganja MD ELECTRONICALLY SIGNED 12/28/2013 2:26

## 2015-05-15 ENCOUNTER — Other Ambulatory Visit: Payer: Self-pay | Admitting: Internal Medicine

## 2015-05-20 DIAGNOSIS — H2513 Age-related nuclear cataract, bilateral: Secondary | ICD-10-CM | POA: Diagnosis not present

## 2015-05-20 DIAGNOSIS — H3531 Nonexudative age-related macular degeneration: Secondary | ICD-10-CM | POA: Diagnosis not present

## 2015-05-23 DIAGNOSIS — H2513 Age-related nuclear cataract, bilateral: Secondary | ICD-10-CM | POA: Diagnosis not present

## 2015-05-24 ENCOUNTER — Encounter: Payer: Self-pay | Admitting: *Deleted

## 2015-05-28 NOTE — Discharge Instructions (Signed)

## 2015-05-29 ENCOUNTER — Ambulatory Visit
Admission: RE | Admit: 2015-05-29 | Discharge: 2015-05-29 | Disposition: A | Discharge status: Home / Self Care | Payer: Medicare Other | Source: Ambulatory Visit | Attending: Ophthalmology | Admitting: Ophthalmology

## 2015-05-29 ENCOUNTER — Encounter: Payer: Self-pay | Admitting: Anesthesiology

## 2015-05-29 ENCOUNTER — Ambulatory Visit: Payer: Medicare Other | Admitting: Anesthesiology

## 2015-05-29 ENCOUNTER — Encounter
Admission: RE | Disposition: A | Discharge status: Home / Self Care | Payer: Self-pay | Source: Ambulatory Visit | Attending: Ophthalmology

## 2015-05-29 DIAGNOSIS — R0602 Shortness of breath: Secondary | ICD-10-CM | POA: Diagnosis not present

## 2015-05-29 DIAGNOSIS — Z888 Allergy status to other drugs, medicaments and biological substances status: Secondary | ICD-10-CM | POA: Insufficient documentation

## 2015-05-29 DIAGNOSIS — H2512 Age-related nuclear cataract, left eye: Secondary | ICD-10-CM | POA: Insufficient documentation

## 2015-05-29 DIAGNOSIS — Z87891 Personal history of nicotine dependence: Secondary | ICD-10-CM | POA: Insufficient documentation

## 2015-05-29 DIAGNOSIS — E780 Pure hypercholesterolemia: Secondary | ICD-10-CM | POA: Diagnosis not present

## 2015-05-29 DIAGNOSIS — H9193 Unspecified hearing loss, bilateral: Secondary | ICD-10-CM | POA: Diagnosis not present

## 2015-05-29 DIAGNOSIS — J449 Chronic obstructive pulmonary disease, unspecified: Secondary | ICD-10-CM | POA: Diagnosis not present

## 2015-05-29 DIAGNOSIS — I10 Essential (primary) hypertension: Secondary | ICD-10-CM | POA: Insufficient documentation

## 2015-05-29 DIAGNOSIS — H2513 Age-related nuclear cataract, bilateral: Secondary | ICD-10-CM | POA: Diagnosis not present

## 2015-05-29 HISTORY — DX: Unspecified osteoarthritis, unspecified site: M19.90

## 2015-05-29 HISTORY — PX: CATARACT EXTRACTION W/PHACO: SHX586

## 2015-05-29 HISTORY — DX: Syndrome of inappropriate secretion of antidiuretic hormone: E22.2

## 2015-05-29 SURGERY — PHACOEMULSIFICATION, CATARACT, WITH IOL INSERTION
Anesthesia: Monitor Anesthesia Care | Laterality: Left | Wound class: Clean

## 2015-05-29 MED ORDER — TETRACAINE HCL 0.5 % OP SOLN
1.0000 [drp] | OPHTHALMIC | Status: DC | PRN
  Administered 2015-05-29: 1 [drp] via OPHTHALMIC

## 2015-05-29 MED ORDER — ACETAMINOPHEN 160 MG/5ML PO SOLN
325.0000 mg | ORAL | Status: DC | PRN
Start: 2015-05-29 — End: 2015-05-29

## 2015-05-29 MED ORDER — ACETAMINOPHEN 325 MG PO TABS
325.0000 mg | ORAL_TABLET | ORAL | Status: DC | PRN
Start: 2015-05-29 — End: 2015-05-29

## 2015-05-29 MED ORDER — MIDAZOLAM HCL 2 MG/2ML IJ SOLN
INTRAMUSCULAR | Status: DC | PRN
  Administered 2015-05-29: 1 mg via INTRAVENOUS

## 2015-05-29 MED ORDER — BRIMONIDINE TARTRATE 0.2 % OP SOLN
OPHTHALMIC | Status: DC | PRN
  Administered 2015-05-29: 1 [drp] via OPHTHALMIC

## 2015-05-29 MED ORDER — CEFUROXIME OPHTHALMIC INJECTION 1 MG/0.1 ML
INJECTION | OPHTHALMIC | Status: DC | PRN
  Administered 2015-05-29: 0.1 mL via INTRACAMERAL

## 2015-05-29 MED ORDER — POVIDONE-IODINE 5 % OP SOLN
1.0000 "application " | OPHTHALMIC | Status: DC | PRN
  Administered 2015-05-29: 1 via OPHTHALMIC

## 2015-05-29 MED ORDER — TIMOLOL MALEATE 0.5 % OP SOLN
OPHTHALMIC | Status: DC | PRN
  Administered 2015-05-29: 1 [drp] via OPHTHALMIC

## 2015-05-29 MED ORDER — NA HYALUR & NA CHOND-NA HYALUR 0.4-0.35 ML IO KIT
PACK | INTRAOCULAR | Status: DC | PRN
  Administered 2015-05-29: 1 mL via INTRAOCULAR

## 2015-05-29 MED ORDER — ARMC OPHTHALMIC DILATING GEL
1.0000 "application " | OPHTHALMIC | Status: DC | PRN
  Administered 2015-05-29 (×2): 1 via OPHTHALMIC

## 2015-05-29 MED ORDER — FENTANYL CITRATE (PF) 100 MCG/2ML IJ SOLN
INTRAMUSCULAR | Status: DC | PRN
  Administered 2015-05-29: 50 ug via INTRAVENOUS

## 2015-05-29 MED ORDER — EPINEPHRINE HCL 1 MG/ML IJ SOLN
INTRAMUSCULAR | Status: DC | PRN
  Administered 2015-05-29: 1 mg

## 2015-05-29 SURGICAL SUPPLY — 26 items
CANNULA ANT/CHMB 27GA (MISCELLANEOUS) ×3 IMPLANT
GLOVE SURG LX 7.5 STRW (GLOVE) ×2
GLOVE SURG LX STRL 7.5 STRW (GLOVE) ×1 IMPLANT
GLOVE SURG TRIUMPH 8.0 PF LTX (GLOVE) ×3 IMPLANT
GOWN STRL REUS W/ TWL LRG LVL3 (GOWN DISPOSABLE) ×2 IMPLANT
GOWN STRL REUS W/TWL LRG LVL3 (GOWN DISPOSABLE) ×4
LENS IOL TECNIS 20.5 (Intraocular Lens) ×3 IMPLANT
LENS IOL TECNIS MONO 1P 20.5 (Intraocular Lens) ×1 IMPLANT
MARKER SKIN SURG W/RULER VIO (MISCELLANEOUS) ×3 IMPLANT
NDL RETROBULBAR .5 NSTRL (NEEDLE) IMPLANT
NEEDLE FILTER BLUNT 18X 1/2SAF (NEEDLE) ×2
NEEDLE FILTER BLUNT 18X1 1/2 (NEEDLE) ×1 IMPLANT
PACK CATARACT BRASINGTON (MISCELLANEOUS) ×3 IMPLANT
PACK EYE AFTER SURG (MISCELLANEOUS) ×3 IMPLANT
PACK OPTHALMIC (MISCELLANEOUS) ×3 IMPLANT
RING MALYGIN 7.0 (MISCELLANEOUS) IMPLANT
SUT ETHILON 10-0 CS-B-6CS-B-6 (SUTURE)
SUT VICRYL  9 0 (SUTURE)
SUT VICRYL 9 0 (SUTURE) IMPLANT
SUTURE EHLN 10-0 CS-B-6CS-B-6 (SUTURE) IMPLANT
SYR 3ML LL SCALE MARK (SYRINGE) ×3 IMPLANT
SYR 5ML LL (SYRINGE) IMPLANT
SYR TB 1ML LUER SLIP (SYRINGE) ×3 IMPLANT
WATER STERILE IRR 250ML POUR (IV SOLUTION) ×3 IMPLANT
WATER STERILE IRR 500ML POUR (IV SOLUTION) IMPLANT
WIPE NON LINTING 3.25X3.25 (MISCELLANEOUS) ×3 IMPLANT

## 2015-05-29 NOTE — H&P (Signed)
  The History and Physical notes were scanned in.  The patient remains stable and unchanged from the H&P.   Previous H&P reviewed, patient examined, and there are no changes.  Brittney Jackson 05/29/2015 10:05 AM

## 2015-05-29 NOTE — Transfer of Care (Signed)
Immediate Anesthesia Transfer of Care Note  Patient: Brittney Jackson  Procedure(s) Performed: Procedure(s): CATARACT EXTRACTION PHACO AND INTRAOCULAR LENS PLACEMENT (IOC) (Left)  Patient Location: PACU  Anesthesia Type: MAC  Level of Consciousness: awake, alert  and patient cooperative  Airway and Oxygen Therapy: Patient Spontanous Breathing and Patient connected to supplemental oxygen  Post-op Assessment: Post-op Vital signs reviewed, Patient's Cardiovascular Status Stable, Respiratory Function Stable, Patent Airway and No signs of Nausea or vomiting  Post-op Vital Signs: Reviewed and stable  Complications: No apparent anesthesia complications

## 2015-05-29 NOTE — Anesthesia Procedure Notes (Signed)
Procedure Name: MAC Date/Time: 05/29/2015 11:12 AM Performed by: Cameron Ali Pre-anesthesia Checklist: Patient identified, Emergency Drugs available, Suction available, Timeout performed and Patient being monitored Patient Re-evaluated:Patient Re-evaluated prior to inductionOxygen Delivery Method: Nasal cannula Placement Confirmation: positive ETCO2

## 2015-05-29 NOTE — Op Note (Signed)
OPERATIVE NOTE  Brittney Jackson [id number] 05/29/2015   PREOPERATIVE DIAGNOSIS:  Nuclear sclerotic cataract left eye. H25.12   POSTOPERATIVE DIAGNOSIS:    Nuclear sclerotic cataract left eye.     PROCEDURE:  Phacoemusification with posterior chamber intraocular lens placement of the left eye   LENS:   Implant Name Type Inv. Item Serial No. Manufacturer Lot No. LRB No. Used  LENS IMPL INTRAOC ZCB00 20.5 - [id number] Intraocular Lens LENS IMPL INTRAOC ZCB00 20.5 [id number] AMO   Left 1        ULTRASOUND TIME: 21 of 1 minutes 16 seconds, CDE 16.1  SURGEON:  Wyonia Hough, MD   ANESTHESIA:  Topical with tetracaine drops and 2% Xylocaine jelly.   COMPLICATIONS:  None.   DESCRIPTION OF PROCEDURE:  The patient was identified in the holding room and transported to the operating room and placed in the supine position under the operating microscope.  The left eye was identified as the operative eye and it was prepped and draped in the usual sterile ophthalmic fashion.   A 1 millimeter clear-corneal paracentesis was made at the 1:30 position.  The anterior chamber was filled with Viscoat viscoelastic.  A 2.4 millimeter keratome was used to make a near-clear corneal incision at the 10:30 position.  .  A curvilinear capsulorrhexis was made with a cystotome and capsulorrhexis forceps.  Balanced salt solution was used to hydrodissect and hydrodelineate the nucleus.   Phacoemulsification was then used in stop and chop fashion to remove the lens nucleus and epinucleus.  The remaining cortex was then removed using the irrigation and aspiration handpiece. Provisc was then placed into the capsular bag to distend it for lens placement.  A lens was then injected into the capsular bag.  The remaining viscoelastic was aspirated.   Wounds were hydrated with balanced salt solution.  The anterior chamber was inflated to a physiologic pressure with balanced salt solution.  No wound leaks were noted.  Cefuroxime 0.1 ml of a 10mg /ml solution was injected into the anterior chamber for a dose of 1 mg of intracameral antibiotic at the completion of the case.   Timolol and Brimonidine drops were applied to the eye.  The patient was taken to the recovery room in stable condition without complications of anesthesia or surgery.  Brittney Jackson 05/29/2015, 11:27 AM

## 2015-05-29 NOTE — Anesthesia Preprocedure Evaluation (Signed)
Anesthesia Evaluation  Patient identified by MRN, date of birth, ID band  Reviewed: Allergy & Precautions, H&P , NPO status , Patient's Chart, lab work & pertinent test results, reviewed documented beta blocker date and time   Airway Mallampati: II  TM Distance: >3 FB Neck ROM: full    Dental no notable dental hx.    Pulmonary former smoker,    Pulmonary exam normal       Cardiovascular hypertension, Rhythm:regular Rate:Normal     Neuro/Psych    GI/Hepatic   Endo/Other  Hypothyroidism   Renal/GU      Musculoskeletal   Abdominal   Peds  Hematology   Anesthesia Other Findings   Reproductive/Obstetrics                             Anesthesia Physical Anesthesia Plan  ASA: II  Anesthesia Plan: MAC   Post-op Pain Management:    Induction:   Airway Management Planned:   Additional Equipment:   Intra-op Plan:   Post-operative Plan:   Informed Consent: I have reviewed the patients History and Physical, chart, labs and discussed the procedure including the risks, benefits and alternatives for the proposed anesthesia with the patient or authorized representative who has indicated his/her understanding and acceptance.     Plan Discussed with: CRNA  Anesthesia Plan Comments:         Anesthesia Quick Evaluation

## 2015-05-29 NOTE — Anesthesia Postprocedure Evaluation (Signed)
  Anesthesia Post-op Note  Patient: Brittney Jackson  Procedure(s) Performed: Procedure(s): CATARACT EXTRACTION PHACO AND INTRAOCULAR LENS PLACEMENT (IOC) (Left)  Anesthesia type:MAC  Patient location: PACU  Post pain: Pain level controlled  Post assessment: Post-op Vital signs reviewed, Patient's Cardiovascular Status Stable, Respiratory Function Stable, Patent Airway and No signs of Nausea or vomiting  Post vital signs: Reviewed and stable  Last Vitals:  Filed Vitals:   05/29/15 1130  BP:   Pulse: 64  Temp: 36.3 C  Resp:     Level of consciousness: awake, alert  and patient cooperative  Complications: No apparent anesthesia complications

## 2015-05-30 ENCOUNTER — Encounter: Payer: Self-pay | Admitting: Ophthalmology

## 2015-06-13 ENCOUNTER — Other Ambulatory Visit: Payer: Self-pay | Admitting: Internal Medicine

## 2015-07-25 DIAGNOSIS — N183 Chronic kidney disease, stage 3 (moderate): Secondary | ICD-10-CM | POA: Diagnosis not present

## 2015-07-25 DIAGNOSIS — I10 Essential (primary) hypertension: Secondary | ICD-10-CM | POA: Diagnosis not present

## 2015-07-25 DIAGNOSIS — E871 Hypo-osmolality and hyponatremia: Secondary | ICD-10-CM | POA: Diagnosis not present

## 2015-07-26 ENCOUNTER — Encounter: Payer: Self-pay | Admitting: Internal Medicine

## 2015-08-09 DIAGNOSIS — N183 Chronic kidney disease, stage 3 (moderate): Secondary | ICD-10-CM | POA: Diagnosis not present

## 2015-08-09 DIAGNOSIS — I10 Essential (primary) hypertension: Secondary | ICD-10-CM | POA: Diagnosis not present

## 2015-08-09 DIAGNOSIS — E871 Hypo-osmolality and hyponatremia: Secondary | ICD-10-CM | POA: Diagnosis not present

## 2015-08-30 ENCOUNTER — Encounter: Payer: Self-pay | Admitting: Internal Medicine

## 2015-08-30 ENCOUNTER — Ambulatory Visit (INDEPENDENT_AMBULATORY_CARE_PROVIDER_SITE_OTHER): Payer: Medicare Other | Admitting: Internal Medicine

## 2015-08-30 VITALS — BP 166/90 | HR 72 | Temp 97.5°F | Ht 59.5 in | Wt 141.8 lb

## 2015-08-30 DIAGNOSIS — Z9842 Cataract extraction status, left eye: Secondary | ICD-10-CM

## 2015-08-30 DIAGNOSIS — R030 Elevated blood-pressure reading, without diagnosis of hypertension: Secondary | ICD-10-CM | POA: Diagnosis not present

## 2015-08-30 DIAGNOSIS — Z23 Encounter for immunization: Secondary | ICD-10-CM | POA: Diagnosis not present

## 2015-08-30 DIAGNOSIS — E038 Other specified hypothyroidism: Secondary | ICD-10-CM

## 2015-08-30 DIAGNOSIS — R918 Other nonspecific abnormal finding of lung field: Secondary | ICD-10-CM

## 2015-08-30 DIAGNOSIS — E871 Hypo-osmolality and hyponatremia: Secondary | ICD-10-CM | POA: Diagnosis not present

## 2015-08-30 DIAGNOSIS — R911 Solitary pulmonary nodule: Secondary | ICD-10-CM

## 2015-08-30 DIAGNOSIS — E034 Atrophy of thyroid (acquired): Secondary | ICD-10-CM

## 2015-08-30 DIAGNOSIS — IMO0001 Reserved for inherently not codable concepts without codable children: Secondary | ICD-10-CM

## 2015-08-30 NOTE — Patient Instructions (Signed)
I am referring you for a repeat CT scan of your lungs ,  To make sure the "density" noted on the 2015 Ct scan has resolved.  This is important because if it has not resolved,  It may be responsible for your SIADH    Ask Dr Kingsley Callander to check a CMET , Vitamin D and TSH with your next lab visit (to save you the additional "stick" you will need here)

## 2015-08-30 NOTE — Progress Notes (Signed)
Subjective:  Patient ID: Brittney Jackson, female    DOB: 07-28-1930  Age: 81 y.o. MRN: [id number]  CC: The primary encounter diagnosis was Lung nodule < 6cm on CT. Diagnoses of Encounter for immunization, White coat hypertension, Hyponatremia, Abnormal CT scan, lung, Hypothyroidism due to acquired atrophy of thyroid, and S/P left cataract extraction were also pertinent to this visit.  HPI Brittney Jackson presents for follow up on hypertension, hyperlipidemia and hypothyroidism.Patient is taking her medications as prescribed and notes no adverse effects.  Home BP readings have been done 3 times per week and are  generally < 130/80 .  Marland Kitchen   Home BP remain well controlled on current regimen,  Had labs done recently ,at Dr Kingsley Callander in July.  Sodium had dropped to 128 and with fluid restriction to 1300 ml daily it rose to 133She is adding salt in her diet due to recurrent hyponatremia and walking regularly about 3 times per week for exercise .  Etiology of her hyponatremia was discussed. SIADH.  Discussed my concern that her abnormal lung CT from April 2015 was never repeated .    Had left cataract surgery removed.    Does not want colonoscopy or other colon ca screening   Outpatient Prescriptions Prior to Visit  Medication Sig Dispense Refill  . aspirin 81 MG tablet Take 81 mg by mouth daily.    . cholecalciferol (VITAMIN D) 1000 UNITS tablet Take 1,000 Units by mouth daily.    Marland Kitchen labetalol (NORMODYNE) 300 MG tablet TAKE ONE (1) TABLET THREE (3) TIMES EACH DAY (Patient taking differently: 1/2 tab (150 mg) Am and PM) 270 tablet 1  . levothyroxine (SYNTHROID, LEVOTHROID) 88 MCG tablet TAKE ONE (1) TABLET BY MOUTH EVERY DAY 90 tablet 1  . losartan (COZAAR) 100 MG tablet TAKE ONE (1) TABLET BY MOUTH EVERY DAY 90 tablet 1  . simvastatin (ZOCOR) 40 MG tablet TAKE ONE (1) TABLET AT BEDTIME 90 tablet 1   No facility-administered medications prior to visit.    Review of Systems;  Patient denies headache,  fevers, malaise, unintentional weight loss, skin rash, eye pain, sinus congestion and sinus pain, sore throat, dysphagia,  hemoptysis , cough, dyspnea, wheezing, chest pain, palpitations, orthopnea, edema, abdominal pain, nausea, melena, diarrhea, constipation, flank pain, dysuria, hematuria, urinary  Frequency, nocturia, numbness, tingling, seizures,  Focal weakness, Loss of consciousness,  Tremor, insomnia, depression, anxiety, and suicidal ideation.      Objective:  BP 166/90 mmHg  Pulse 72  Temp(Src) 97.5 F (36.4 C) (Oral)  Ht 4' 11.5" (1.511 m)  Wt 141 lb 12.8 oz (64.32 kg)  BMI 28.17 kg/m2  SpO2 96%  BP Readings from Last 3 Encounters:  08/30/15 166/90  05/29/15 186/68  02/27/15 132/73    Wt Readings from Last 3 Encounters:  08/30/15 141 lb 12.8 oz (64.32 kg)  05/29/15 140 lb (63.504 kg)  02/27/15 144 lb 8 oz (65.545 kg)    General appearance: alert, cooperative and appears stated age Ears: normal TM's and external ear canals both ears Throat: lips, mucosa, and tongue normal; teeth and gums normal Neck: no adenopathy, no carotid bruit, supple, symmetrical, trachea midline and thyroid not enlarged, symmetric, no tenderness/mass/nodules Back: symmetric, no curvature. ROM normal. No CVA tenderness. Lungs: clear to auscultation bilaterally Heart: regular rate and rhythm, S1, S2 normal, no murmur, click, rub or gallop Abdomen: soft, non-tender; bowel sounds normal; no masses,  no organomegaly Pulses: 2+ and symmetric Skin: Skin color, texture, turgor normal. No rashes or lesions  Lymph nodes: Cervical, supraclavicular, and axillary nodes normal.  Lab Results  Component Value Date   HGBA1C 6.7* 12/28/2013    Lab Results  Component Value Date   CREATININE 0.94 02/27/2015   CREATININE 0.9 08/28/2014   CREATININE 1.0 01/09/2014    Lab Results  Component Value Date   WBC 7.2 08/28/2014   HGB 12.9 08/28/2014   HCT 39.3 08/28/2014   PLT 339.0 08/28/2014   GLUCOSE 91  02/27/2015   CHOL 125 08/28/2014   TRIG 141.0 08/28/2014   HDL 43.20 08/28/2014   LDLDIRECT 67.0 01/24/2013   LDLCALC 54 08/28/2014   ALT 15 02/27/2015   AST 19 02/27/2015   NA 133* 02/27/2015   K 4.0 02/27/2015   CL 96 02/27/2015   CREATININE 0.94 02/27/2015   BUN 18 02/27/2015   CO2 32 02/27/2015   TSH 1.44 02/27/2015   HGBA1C 6.7* 12/28/2013    No results found.  Assessment & Plan:   Problem List Items Addressed This Visit      High   White coat hypertension    Home readings have been at goal.         Unprioritized   Hypothyroidism    Thyroid function is WNL on current dose.  No current changes needed.   Lab Results  Component Value Date   TSH 1.44 02/27/2015            Hyponatremia    Secondary to SIADH,  Since April 2015.  Need to rule out lung mass ,  Given history of smoking and abnormal CT last year.       Abnormal CT scan, lung    Found during admission for pneumnia and hyponatremia,  No repeat done.  Repeating now       S/P left cataract extraction    She is contemplating right sided removal since the results was so dramatic        Other Visit Diagnoses    Lung nodule < 6cm on CT    -  Primary    Relevant Orders    CT Chest Wo Contrast    Encounter for immunization          A total of 60minutes of face to face time was spent with patient more than half of which was spent in counselling about the above mentioned conditions, reviewing prior and outside records with patient,   and coordination of care  I am having Ms. Brittney Jackson maintain her cholecalciferol, aspirin, simvastatin, levothyroxine, labetalol, and losartan.  No orders of the defined types were placed in this encounter.    There are no discontinued medications.  Follow-up: No Follow-up on file.   Crecencio Mc, MD

## 2015-08-30 NOTE — Progress Notes (Signed)
Pre visit review using our clinic review tool, if applicable. No additional management support is needed unless otherwise documented below in the visit note. 

## 2015-09-01 ENCOUNTER — Encounter: Payer: Self-pay | Admitting: Internal Medicine

## 2015-09-01 DIAGNOSIS — Z9842 Cataract extraction status, left eye: Secondary | ICD-10-CM | POA: Insufficient documentation

## 2015-09-01 DIAGNOSIS — R918 Other nonspecific abnormal finding of lung field: Secondary | ICD-10-CM | POA: Insufficient documentation

## 2015-09-01 NOTE — Assessment & Plan Note (Signed)
Found during admission for pneumnia and hyponatremia,  No repeat done.  Repeating now

## 2015-09-01 NOTE — Assessment & Plan Note (Addendum)
Secondary to SIADH,  Since April 2015.  Need to rule out lung mass ,  Given history of smoking and abnormal CT last year.

## 2015-09-01 NOTE — Assessment & Plan Note (Signed)
Home readings have been at goal.

## 2015-09-01 NOTE — Assessment & Plan Note (Signed)
She is contemplating right sided removal since the results was so dramatic

## 2015-09-01 NOTE — Assessment & Plan Note (Signed)
Thyroid function is WNL on current dose.  No current changes needed.   Lab Results  Component Value Date   TSH 1.44 02/27/2015     

## 2015-09-10 ENCOUNTER — Ambulatory Visit
Admission: RE | Admit: 2015-09-10 | Discharge: 2015-09-10 | Disposition: A | Discharge status: Home / Self Care | Payer: Medicare Other | Source: Ambulatory Visit | Attending: Internal Medicine | Admitting: Internal Medicine

## 2015-09-10 ENCOUNTER — Other Ambulatory Visit: Payer: Self-pay | Admitting: Internal Medicine

## 2015-09-10 DIAGNOSIS — R911 Solitary pulmonary nodule: Secondary | ICD-10-CM

## 2015-09-10 DIAGNOSIS — Z87891 Personal history of nicotine dependence: Secondary | ICD-10-CM | POA: Diagnosis not present

## 2015-09-10 DIAGNOSIS — Z09 Encounter for follow-up examination after completed treatment for conditions other than malignant neoplasm: Secondary | ICD-10-CM | POA: Insufficient documentation

## 2015-09-10 DIAGNOSIS — IMO0001 Reserved for inherently not codable concepts without codable children: Secondary | ICD-10-CM

## 2015-09-10 DIAGNOSIS — R079 Chest pain, unspecified: Secondary | ICD-10-CM | POA: Diagnosis not present

## 2015-09-10 DIAGNOSIS — I2510 Atherosclerotic heart disease of native coronary artery without angina pectoris: Secondary | ICD-10-CM | POA: Diagnosis not present

## 2015-09-10 DIAGNOSIS — R0602 Shortness of breath: Secondary | ICD-10-CM | POA: Diagnosis not present

## 2015-09-12 ENCOUNTER — Encounter: Payer: Self-pay | Admitting: Internal Medicine

## 2015-09-26 NOTE — Telephone Encounter (Signed)
Sent unread message to patient.

## 2015-11-19 ENCOUNTER — Other Ambulatory Visit: Payer: Self-pay | Admitting: Internal Medicine

## 2015-12-31 ENCOUNTER — Other Ambulatory Visit: Payer: Self-pay | Admitting: Internal Medicine

## 2015-12-31 DIAGNOSIS — H2511 Age-related nuclear cataract, right eye: Secondary | ICD-10-CM | POA: Diagnosis not present

## 2015-12-31 DIAGNOSIS — H353132 Nonexudative age-related macular degeneration, bilateral, intermediate dry stage: Secondary | ICD-10-CM | POA: Diagnosis not present

## 2016-01-03 DIAGNOSIS — H2511 Age-related nuclear cataract, right eye: Secondary | ICD-10-CM | POA: Diagnosis not present

## 2016-01-07 ENCOUNTER — Encounter: Payer: Self-pay | Admitting: *Deleted

## 2016-01-14 NOTE — Discharge Instructions (Signed)

## 2016-01-15 ENCOUNTER — Ambulatory Visit
Admission: RE | Admit: 2016-01-15 | Discharge: 2016-01-15 | Disposition: A | Discharge status: Home / Self Care | Payer: Medicare Other | Source: Ambulatory Visit | Attending: Ophthalmology | Admitting: Ophthalmology

## 2016-01-15 ENCOUNTER — Ambulatory Visit: Payer: Medicare Other | Admitting: Anesthesiology

## 2016-01-15 ENCOUNTER — Encounter
Admission: RE | Disposition: A | Discharge status: Home / Self Care | Payer: Self-pay | Source: Ambulatory Visit | Attending: Ophthalmology

## 2016-01-15 DIAGNOSIS — E039 Hypothyroidism, unspecified: Secondary | ICD-10-CM | POA: Insufficient documentation

## 2016-01-15 DIAGNOSIS — E7800 Pure hypercholesterolemia, unspecified: Secondary | ICD-10-CM | POA: Insufficient documentation

## 2016-01-15 DIAGNOSIS — J449 Chronic obstructive pulmonary disease, unspecified: Secondary | ICD-10-CM | POA: Diagnosis not present

## 2016-01-15 DIAGNOSIS — R0602 Shortness of breath: Secondary | ICD-10-CM | POA: Diagnosis not present

## 2016-01-15 DIAGNOSIS — I10 Essential (primary) hypertension: Secondary | ICD-10-CM | POA: Insufficient documentation

## 2016-01-15 DIAGNOSIS — N289 Disorder of kidney and ureter, unspecified: Secondary | ICD-10-CM | POA: Diagnosis not present

## 2016-01-15 DIAGNOSIS — H2511 Age-related nuclear cataract, right eye: Secondary | ICD-10-CM | POA: Insufficient documentation

## 2016-01-15 DIAGNOSIS — E871 Hypo-osmolality and hyponatremia: Secondary | ICD-10-CM | POA: Insufficient documentation

## 2016-01-15 DIAGNOSIS — M170 Bilateral primary osteoarthritis of knee: Secondary | ICD-10-CM | POA: Diagnosis not present

## 2016-01-15 DIAGNOSIS — H9193 Unspecified hearing loss, bilateral: Secondary | ICD-10-CM | POA: Diagnosis not present

## 2016-01-15 DIAGNOSIS — Z87891 Personal history of nicotine dependence: Secondary | ICD-10-CM | POA: Insufficient documentation

## 2016-01-15 DIAGNOSIS — Z888 Allergy status to other drugs, medicaments and biological substances status: Secondary | ICD-10-CM | POA: Insufficient documentation

## 2016-01-15 HISTORY — PX: CATARACT EXTRACTION W/PHACO: SHX586

## 2016-01-15 SURGERY — PHACOEMULSIFICATION, CATARACT, WITH IOL INSERTION
Anesthesia: Monitor Anesthesia Care | Laterality: Right | Wound class: Clean

## 2016-01-15 MED ORDER — POVIDONE-IODINE 5 % OP SOLN
1.0000 "application " | OPHTHALMIC | Status: DC | PRN
  Administered 2016-01-15: 1 via OPHTHALMIC

## 2016-01-15 MED ORDER — FENTANYL CITRATE (PF) 100 MCG/2ML IJ SOLN
INTRAMUSCULAR | Status: DC | PRN
  Administered 2016-01-15: 50 ug via INTRAVENOUS

## 2016-01-15 MED ORDER — NA HYALUR & NA CHOND-NA HYALUR 0.4-0.35 ML IO KIT
PACK | INTRAOCULAR | Status: DC | PRN
  Administered 2016-01-15: 1 mL via INTRAOCULAR

## 2016-01-15 MED ORDER — BALANCED SALT IO SOLN
INTRAOCULAR | Status: DC | PRN
  Administered 2016-01-15: 1 mL via OPHTHALMIC

## 2016-01-15 MED ORDER — TETRACAINE HCL 0.5 % OP SOLN
1.0000 [drp] | OPHTHALMIC | Status: DC | PRN
  Administered 2016-01-15: 1 [drp] via OPHTHALMIC

## 2016-01-15 MED ORDER — BRIMONIDINE TARTRATE 0.2 % OP SOLN
OPHTHALMIC | Status: DC | PRN
  Administered 2016-01-15: 1 [drp] via OPHTHALMIC

## 2016-01-15 MED ORDER — TIMOLOL MALEATE 0.5 % OP SOLN
OPHTHALMIC | Status: DC | PRN
Start: 2016-01-15 — End: 2016-01-15
  Administered 2016-01-15: 1 [drp] via OPHTHALMIC

## 2016-01-15 MED ORDER — MIDAZOLAM HCL 2 MG/2ML IJ SOLN
INTRAMUSCULAR | Status: DC | PRN
  Administered 2016-01-15: 2 mg via INTRAVENOUS

## 2016-01-15 MED ORDER — LACTATED RINGERS IV SOLN: INTRAVENOUS | Status: DC

## 2016-01-15 MED ORDER — ARMC OPHTHALMIC DILATING GEL
1.0000 "application " | OPHTHALMIC | Status: DC | PRN
  Administered 2016-01-15 (×2): 1 via OPHTHALMIC

## 2016-01-15 MED ORDER — BSS IO SOLN
INTRAOCULAR | Status: DC | PRN
  Administered 2016-01-15: 69 mL via OPHTHALMIC

## 2016-01-15 MED ORDER — CEFUROXIME OPHTHALMIC INJECTION 1 MG/0.1 ML
INJECTION | OPHTHALMIC | Status: DC | PRN
  Administered 2016-01-15: 0.1 mL via OPHTHALMIC

## 2016-01-15 SURGICAL SUPPLY — 27 items
CANNULA ANT/CHMB 27GA (MISCELLANEOUS) ×3 IMPLANT
CARTRIDGE ABBOTT (MISCELLANEOUS) ×3 IMPLANT
GLOVE SURG LX 7.5 STRW (GLOVE) ×2
GLOVE SURG LX STRL 7.5 STRW (GLOVE) ×1 IMPLANT
GLOVE SURG TRIUMPH 8.0 PF LTX (GLOVE) ×3 IMPLANT
GOWN STRL REUS W/ TWL LRG LVL3 (GOWN DISPOSABLE) ×2 IMPLANT
GOWN STRL REUS W/TWL LRG LVL3 (GOWN DISPOSABLE) ×4
LENS IOL TECNIS 21.5 (Intraocular Lens) ×3 IMPLANT
LENS IOL TECNIS MONO 1P 21.5 (Intraocular Lens) ×1 IMPLANT
MARKER SKIN SURG W/RULER VIO (MISCELLANEOUS) ×3 IMPLANT
NDL RETROBULBAR .5 NSTRL (NEEDLE) IMPLANT
NEEDLE FILTER BLUNT 18X 1/2SAF (NEEDLE) ×2
NEEDLE FILTER BLUNT 18X1 1/2 (NEEDLE) ×1 IMPLANT
PACK CATARACT BRASINGTON (MISCELLANEOUS) ×3 IMPLANT
PACK EYE AFTER SURG (MISCELLANEOUS) ×3 IMPLANT
PACK OPTHALMIC (MISCELLANEOUS) ×3 IMPLANT
RING MALYGIN 7.0 (MISCELLANEOUS) IMPLANT
SUT ETHILON 10-0 CS-B-6CS-B-6 (SUTURE)
SUT VICRYL  9 0 (SUTURE)
SUT VICRYL 9 0 (SUTURE) IMPLANT
SUTURE EHLN 10-0 CS-B-6CS-B-6 (SUTURE) IMPLANT
SYR 3ML LL SCALE MARK (SYRINGE) ×3 IMPLANT
SYR 5ML LL (SYRINGE) IMPLANT
SYR TB 1ML LUER SLIP (SYRINGE) ×3 IMPLANT
WATER STERILE IRR 250ML POUR (IV SOLUTION) ×3 IMPLANT
WATER STERILE IRR 500ML POUR (IV SOLUTION) IMPLANT
WIPE NON LINTING 3.25X3.25 (MISCELLANEOUS) ×3 IMPLANT

## 2016-01-15 NOTE — Anesthesia Procedure Notes (Signed)
Procedure Name: MAC Performed by: Tonnie Stillman Pre-anesthesia Checklist: Patient identified, Emergency Drugs available, Suction available, Timeout performed and Patient being monitored Patient Re-evaluated:Patient Re-evaluated prior to inductionOxygen Delivery Method: Nasal cannula Placement Confirmation: positive ETCO2     

## 2016-01-15 NOTE — Anesthesia Preprocedure Evaluation (Signed)
Anesthesia Evaluation  Patient identified by MRN, date of birth, ID band  Reviewed: Allergy & Precautions, H&P , NPO status , Patient's Chart, lab work & pertinent test results  Airway Mallampati: II  TM Distance: >3 FB Neck ROM: full    Dental no notable dental hx.    Pulmonary former smoker,    Pulmonary exam normal       Cardiovascular hypertension, Rhythm:regular Rate:Normal     Neuro/Psych    GI/Hepatic   Endo/Other  Hypothyroidism   Renal/GU      Musculoskeletal   Abdominal   Peds  Hematology   Anesthesia Other Findings   Reproductive/Obstetrics                             Anesthesia Physical Anesthesia Plan  ASA: II  Anesthesia Plan: MAC   Post-op Pain Management:    Induction:   Airway Management Planned:   Additional Equipment:   Intra-op Plan:   Post-operative Plan:   Informed Consent: I have reviewed the patients History and Physical, chart, labs and discussed the procedure including the risks, benefits and alternatives for the proposed anesthesia with the patient or authorized representative who has indicated his/her understanding and acceptance.     Plan Discussed with: CRNA  Anesthesia Plan Comments:         Anesthesia Quick Evaluation  

## 2016-01-15 NOTE — H&P (Signed)
  The History and Physical notes are on paper, have been signed, and are to be scanned. The patient remains stable and unchanged from the H&P.   Previous H&P reviewed, patient examined, and there are no changes.  Arcangel Minion 01/15/2016 8:37 AM

## 2016-01-15 NOTE — Anesthesia Postprocedure Evaluation (Signed)
Anesthesia Post Note  Patient: Brittney Jackson  Procedure(s) Performed: Procedure(s) (LRB): CATARACT EXTRACTION PHACO AND INTRAOCULAR LENS PLACEMENT (IOC) (Right)  Patient location during evaluation: PACU Anesthesia Type: MAC Level of consciousness: awake and alert and oriented Pain management: satisfactory to patient Vital Signs Assessment: post-procedure vital signs reviewed and stable Respiratory status: spontaneous breathing, nonlabored ventilation and respiratory function stable Cardiovascular status: blood pressure returned to baseline and stable Postop Assessment: Adequate PO intake and No signs of nausea or vomiting Anesthetic complications: no    Raliegh Ip

## 2016-01-15 NOTE — Transfer of Care (Signed)
Immediate Anesthesia Transfer of Care Note  Patient: Brittney Jackson  Procedure(s) Performed: Procedure(s) with comments: CATARACT EXTRACTION PHACO AND INTRAOCULAR LENS PLACEMENT ([place]) (Right) - [place]  Patient Location: PACU  Anesthesia Type: MAC  Level of Consciousness: awake, alert  and patient cooperative  Airway and Oxygen Therapy: Patient Spontanous Breathing and Patient connected to supplemental oxygen  Post-op Assessment: Post-op Vital signs reviewed, Patient's Cardiovascular Status Stable, Respiratory Function Stable, Patent Airway and No signs of Nausea or vomiting  Post-op Vital Signs: Reviewed and stable  Complications: No apparent anesthesia complications

## 2016-01-15 NOTE — Op Note (Signed)
LOCATION:  [facility]   PREOPERATIVE DIAGNOSIS:    Nuclear sclerotic cataract right eye. H25.11   POSTOPERATIVE DIAGNOSIS:  Nuclear sclerotic cataract right eye.     PROCEDURE:  Phacoemusification with posterior chamber intraocular lens placement of the right eye   LENS:   Implant Name Type Inv. Item Serial No. Manufacturer Lot No. LRB No. Used  LENS IMPL INTRAOC [id number] 21.5 - [id number] Intraocular Lens LENS IMPL INTRAOC ZCB00 21.5 [id number] AMO   Right 1        ULTRASOUND TIME: 18 % of 1 minutes, 42 seconds.  CDE 18.5   SURGEON:  Wyonia Hough, MD   ANESTHESIA:  Topical with tetracaine drops and 2% Xylocaine jelly.   COMPLICATIONS:  None.   DESCRIPTION OF PROCEDURE:  The patient was identified in the holding room and transported to the operating room and placed in the supine position under the operating microscope.  The right eye was identified as the operative eye and it was prepped and draped in the usual sterile ophthalmic fashion.   A 1 millimeter clear-corneal paracentesis was made at the 12:00 position.  The anterior chamber was filled with Viscoat viscoelastic.  A 2.4 millimeter keratome was used to make a near-clear corneal incision at the 9:00 position.  A curvilinear capsulorrhexis was made with a cystotome and capsulorrhexis forceps.  Balanced salt solution was used to hydrodissect and hydrodelineate the nucleus.   Phacoemulsification was then used in stop and chop fashion to remove the lens nucleus and epinucleus.  The remaining cortex was then removed using the irrigation and aspiration handpiece. Provisc was then placed into the capsular bag to distend it for lens placement.  A lens was then injected into the capsular bag.  The remaining viscoelastic was aspirated.   Wounds were hydrated with balanced salt solution.  The anterior chamber was inflated to a physiologic pressure with balanced salt solution.  No wound leaks were noted. Cefuroxime 0.1 ml of  a 10mg /ml solution was injected into the anterior chamber for a dose of 1 mg of intracameral antibiotic at the completion of the case.   Timolol and Brimonidine drops were applied to the eye.  The patient was taken to the recovery room in stable condition without complications of anesthesia or surgery.   Kameria Canizares 01/15/2016, 9:29 AM

## 2016-01-16 ENCOUNTER — Encounter: Payer: Self-pay | Admitting: Ophthalmology

## 2016-01-28 DIAGNOSIS — I10 Essential (primary) hypertension: Secondary | ICD-10-CM | POA: Diagnosis not present

## 2016-01-28 DIAGNOSIS — N2581 Secondary hyperparathyroidism of renal origin: Secondary | ICD-10-CM | POA: Diagnosis not present

## 2016-01-28 DIAGNOSIS — N183 Chronic kidney disease, stage 3 (moderate): Secondary | ICD-10-CM | POA: Diagnosis not present

## 2016-01-28 DIAGNOSIS — E871 Hypo-osmolality and hyponatremia: Secondary | ICD-10-CM | POA: Diagnosis not present

## 2016-01-29 DIAGNOSIS — N183 Chronic kidney disease, stage 3 (moderate): Secondary | ICD-10-CM | POA: Diagnosis not present

## 2016-01-29 DIAGNOSIS — N2581 Secondary hyperparathyroidism of renal origin: Secondary | ICD-10-CM | POA: Diagnosis not present

## 2016-01-30 LAB — BASIC METABOLIC PANEL
BUN: 17 mg/dL (ref 4–21)
Creatinine: 0.9 mg/dL (ref 0.5–1.1)
GLUCOSE: 107 mg/dL
Potassium: 5.2 mmol/L (ref 3.4–5.3)
SODIUM: 138 mmol/L (ref 137–147)

## 2016-01-30 LAB — HEPATIC FUNCTION PANEL
ALT: 19 U/L (ref 7–35)
AST: 23 U/L (ref 13–35)
Alkaline Phosphatase: 168 U/L — AB (ref 25–125)
BILIRUBIN, TOTAL: 0.4 mg/dL

## 2016-01-30 LAB — MICROALBUMIN, URINE: MICROALB UR: 22.8

## 2016-02-28 ENCOUNTER — Ambulatory Visit (INDEPENDENT_AMBULATORY_CARE_PROVIDER_SITE_OTHER): Payer: Medicare Other | Admitting: Internal Medicine

## 2016-02-28 ENCOUNTER — Encounter: Payer: Self-pay | Admitting: Internal Medicine

## 2016-02-28 VITALS — BP 160/80 | HR 78 | Temp 97.8°F | Resp 14 | Ht 60.0 in | Wt 143.8 lb

## 2016-02-28 DIAGNOSIS — E034 Atrophy of thyroid (acquired): Secondary | ICD-10-CM | POA: Diagnosis not present

## 2016-02-28 DIAGNOSIS — M8580 Other specified disorders of bone density and structure, unspecified site: Secondary | ICD-10-CM | POA: Diagnosis not present

## 2016-02-28 DIAGNOSIS — R030 Elevated blood-pressure reading, without diagnosis of hypertension: Secondary | ICD-10-CM

## 2016-02-28 DIAGNOSIS — E038 Other specified hypothyroidism: Secondary | ICD-10-CM

## 2016-02-28 DIAGNOSIS — IMO0001 Reserved for inherently not codable concepts without codable children: Secondary | ICD-10-CM

## 2016-02-28 DIAGNOSIS — E871 Hypo-osmolality and hyponatremia: Secondary | ICD-10-CM | POA: Diagnosis not present

## 2016-02-28 LAB — BASIC METABOLIC PANEL
BUN: 19 mg/dL (ref 6–23)
CHLORIDE: 101 meq/L (ref 96–112)
CO2: 28 mEq/L (ref 19–32)
Calcium: 9.4 mg/dL (ref 8.4–10.5)
Creatinine, Ser: 0.82 mg/dL (ref 0.40–1.20)
GFR: 70.63 mL/min (ref >60.00)
Glucose, Bld: 103 mg/dL — ABNORMAL HIGH (ref 70–99)
POTASSIUM: 4.2 meq/L (ref 3.5–5.1)
SODIUM: 136 meq/L (ref 135–145)

## 2016-02-28 LAB — TSH: TSH: 1.87 u[IU]/mL (ref 0.35–4.50)

## 2016-02-28 MED ORDER — LABETALOL HCL 300 MG PO TABS: 150.0000 mg | ORAL_TABLET | Freq: Three times a day (TID) | ORAL | Status: DC

## 2016-02-28 NOTE — Progress Notes (Signed)
Subjective:  Patient ID: Brittney Jackson, female    DOB: 08-30-1930  Age: 82 y.o. MRN: [id number]  CC: The primary encounter diagnosis was Hypothyroidism due to acquired atrophy of thyroid. Diagnoses of Hyponatremia, White coat hypertension, and Osteopenia were also pertinent to this visit.  HPI Brittney Jackson presents for follow up on hypertension and hyponatremia.   .Patient is taking her medications as prescribed and notes no adverse effects but notes that her Home BP readings have been 130/80 or less in the morning and elevated to 160/80  ften in the later afternoon.  She is avoiding added salt in her diet and walking regularly about 3 times per week for exercise  .taking labetalol 150 mg  bid,  Wants to increase dose to 300 mg bid based on home readings  She has taking 150 mg labetalol bid. keeps bp 130/70 but toward the end of the 12 hour period  hshe is 160/   Had right eye cataract surgery n Jan 2017,  Left eye was done in June  Lens implant.  [facility]   Broke a tooth today Denies any symptoms suggestive of an expose nerve.  Not getting enough calcium in diet.  Not taking any supplements.   Needs the potassium checked today K was 5.2 on Feb 1 at nephrology  Taking 1000 units of  D3 daily    Lab Results  Component Value Date   CREATININE 0.82 02/28/2016   Lab Results  Component Value Date   NA 136 02/28/2016   K 4.2 02/28/2016   CL 101 02/28/2016   CO2 28 02/28/2016     Outpatient Prescriptions Prior to Visit  Medication Sig Dispense Refill  . aspirin 81 MG tablet Take 81 mg by mouth daily.    . cholecalciferol (VITAMIN D) 1000 UNITS tablet Take 1,000 Units by mouth daily.    Marland Kitchen levothyroxine (SYNTHROID, LEVOTHROID) 88 MCG tablet TAKE ONE (1) TABLET EACH DAY 90 tablet 2  . losartan (COZAAR) 100 MG tablet TAKE ONE (1) TABLET BY MOUTH EVERY DAY 90 tablet 3  . Multiple Vitamins-Minerals (PRESERVISION AREDS PO) Take by mouth daily.    . simvastatin (ZOCOR) 40 MG tablet  TAKE ONE TABLET BY MOUTH EVERY NIGHT AT BEDTIME 90 tablet 4  . labetalol (NORMODYNE) 300 MG tablet TAKE ONE (1) TABLET THREE (3) TIMES EACH DAY (Patient taking differently: 1/2 tab (150 mg) Am and PM) 270 tablet 1   No facility-administered medications prior to visit.    Review of Systems;  Patient denies headache, fevers, malaise, unintentional weight loss, skin rash, eye pain, sinus congestion and sinus pain, sore throat, dysphagia,  hemoptysis , cough, dyspnea, wheezing, chest pain, palpitations, orthopnea, edema, abdominal pain, nausea, melena, diarrhea, constipation, flank pain, dysuria, hematuria, urinary  Frequency, nocturia, numbness, tingling, seizures,  Focal weakness, Loss of consciousness,  Tremor, insomnia, depression, anxiety, and suicidal ideation.      Objective:  BP 160/80 mmHg  Pulse 78  Temp(Src) 97.8 F (36.6 C) (Oral)  Resp 14  Ht 5' (1.524 m)  Wt 143 lb 12.8 oz (65.227 kg)  BMI 28.08 kg/m2  SpO2 95%  BP Readings from Last 3 Encounters:  02/28/16 160/80  01/15/16 147/79  08/30/15 166/90    Wt Readings from Last 3 Encounters:  02/28/16 143 lb 12.8 oz (65.227 kg)  01/15/16 141 lb (63.957 kg)  08/30/15 141 lb 12.8 oz (64.32 kg)    General appearance: alert, cooperative and appears stated age Ears: normal TM's and external  ear canals both ears Throat: lips, mucosa, and tongue normal; teeth and gums normal Neck: no adenopathy, no carotid bruit, supple, symmetrical, trachea midline and thyroid not enlarged, symmetric, no tenderness/mass/nodules Back: symmetric, no curvature. ROM normal. No CVA tenderness. Lungs: clear to auscultation bilaterally Heart: regular rate and rhythm, S1, S2 normal, no murmur, click, rub or gallop Abdomen: soft, non-tender; bowel sounds normal; no masses,  no organomegaly Pulses: 2+ and symmetric Skin: Skin color, texture, turgor normal. No rashes or lesions Lymph nodes: Cervical, supraclavicular, and axillary nodes normal.  Lab  Results  Component Value Date   HGBA1C 6.7* 12/28/2013    Lab Results  Component Value Date   CREATININE 0.82 02/28/2016   CREATININE 0.94 02/27/2015   CREATININE 0.9 08/28/2014    Lab Results  Component Value Date   WBC 7.2 08/28/2014   HGB 12.9 08/28/2014   HCT 39.3 08/28/2014   PLT 339.0 08/28/2014   GLUCOSE 103* 02/28/2016   CHOL 125 08/28/2014   TRIG 141.0 08/28/2014   HDL 43.20 08/28/2014   LDLDIRECT 67.0 01/24/2013   LDLCALC 54 08/28/2014   ALT 15 02/27/2015   AST 19 02/27/2015   NA 136 02/28/2016   K 4.2 02/28/2016   CL 101 02/28/2016   CREATININE 0.82 02/28/2016   BUN 19 02/28/2016   CO2 28 02/28/2016   TSH 1.87 02/28/2016   HGBA1C 6.7* 12/28/2013    No results found.  Assessment & Plan:   Problem List Items Addressed This Visit    White coat hypertension    She has just refill the labetalol for 90 days and does not want to change medication.  Will change dosing to 150 mg tid. Repeat potassium level is normal .     Lab Results  Component Value Date   NA 136 02/28/2016   K 4.2 02/28/2016   CL 101 02/28/2016   CO2 28 02/28/2016         Relevant Medications   labetalol (NORMODYNE) 300 MG tablet   Hypothyroidism - Primary    Thyroid function is WNL on current dose.  No current changes needed.   Lab Results  Component Value Date   TSH 1.87 02/28/2016              Relevant Medications   labetalol (NORMODYNE) 300 MG tablet   Other Relevant Orders   TSH (Completed)   Osteopenia    Taking vitamin d and exercising,  Not taking calcium. Discussed the current controversies surrounding the risks and benefits of calcium supplementation.  Encouraged her to increase dietary calcium through natural foods including almond/coconut milk      Hyponatremia   Relevant Orders   Basic metabolic panel (Completed)     A total of 25 minutes of face to face time was spent with patient more than half of which was spent in counselling about the above  mentioned conditions  and coordination of care  I have changed Brittney Jackson's labetalol. I am also having her maintain her cholecalciferol, aspirin, simvastatin, levothyroxine, losartan, and Multiple Vitamins-Minerals (PRESERVISION AREDS PO).  Meds ordered this encounter  Medications  . labetalol (NORMODYNE) 300 MG tablet    Sig: Take 0.5 tablets (150 mg total) by mouth 3 (three) times daily. 1/2 tab (150 mg) Am and PM    Dispense:  270 tablet    Refill:  1    Medications Discontinued During This Encounter  Medication Reason  . labetalol (NORMODYNE) 300 MG tablet Reorder    Follow-up: Return in  about 6 months (around 08/30/2016), or WELLNESS.   Crecencio Mc, MD

## 2016-02-28 NOTE — Patient Instructions (Addendum)
Take your second dose of labetalol around 2  pm , 3rd dose before you go to bed   Continue taking losartan in the am unless your potassium is elevated   Try vanilla soy milk if you want more protein per cup than almond milk  (stiill has 45% of calcium needs)

## 2016-02-28 NOTE — Progress Notes (Signed)
Pre visit review using our clinic review tool, if applicable. No additional management support is needed unless otherwise documented below in the visit note. 

## 2016-03-01 NOTE — Assessment & Plan Note (Signed)
She has just refill the labetalol for 90 days and does not want to change medication.  Will change dosing to 150 mg tid. Repeat potassium level is normal .     Lab Results  Component Value Date   NA 136 02/28/2016   K 4.2 02/28/2016   CL 101 02/28/2016   CO2 28 02/28/2016

## 2016-03-01 NOTE — Assessment & Plan Note (Signed)
Currently  resolved,  No changes today

## 2016-03-01 NOTE — Assessment & Plan Note (Signed)
Thyroid function is WNL on current dose.  No current changes needed.   Lab Results  Component Value Date   TSH 1.87 02/28/2016

## 2016-03-01 NOTE — Assessment & Plan Note (Signed)
Taking vitamin d and exercising,  Not taking calcium. Discussed the current controversies surrounding the risks and benefits of calcium supplementation.  Encouraged her to increase dietary calcium through natural foods including almond/coconut milk

## 2016-03-02 ENCOUNTER — Encounter: Payer: Self-pay | Admitting: Internal Medicine

## 2016-03-03 ENCOUNTER — Encounter: Payer: Self-pay | Admitting: Internal Medicine

## 2016-03-16 NOTE — Telephone Encounter (Signed)
Mailed unread message to patient.  

## 2016-07-29 ENCOUNTER — Ambulatory Visit (INDEPENDENT_AMBULATORY_CARE_PROVIDER_SITE_OTHER): Payer: Medicare Other

## 2016-07-29 VITALS — BP 160/86 | HR 68 | Temp 98.1°F | Resp 14 | Ht 59.5 in | Wt 143.0 lb

## 2016-07-29 DIAGNOSIS — Z0000 Encounter for general adult medical examination without abnormal findings: Secondary | ICD-10-CM | POA: Diagnosis not present

## 2016-07-29 NOTE — Patient Instructions (Addendum)
  Brittney Jackson , Thank you for taking time to come for your Medicare Wellness Visit. I appreciate your ongoing commitment to your health goals. Please review the following plan we discussed and let me know if I can assist you in the future.    RETURN IN September FOR FOLLOW UP WITH DR. Derrel Nip.  These are the goals we discussed: Goals    . Healthy Lifestyle          STAY HYDRATED AND DRINK FLUIDS/WATER WITHIN RANGE OF RESTRICTION, AS DIRECTED. LOW CARB FOODS. STAY ACTIVE AND MAINTAIN EXERCISE ROUTINE.       This is a list of the screening recommended for you and due dates:  Health Maintenance  Topic Date Due  . Flu Shot  07/28/2016  . Tetanus Vaccine  08/30/2024  . DEXA scan (bone density measurement)  Completed  . Shingles Vaccine  Completed  . Pneumonia vaccines  Completed

## 2016-07-29 NOTE — Progress Notes (Signed)
Subjective:   Brittney Jackson is a 82 y.o. female who presents for Medicare Annual (Subsequent) preventive examination.  Review of Systems:  No ROS.  Medicare Wellness Visit.  Cardiac Risk Factors include: advanced age (>65men, >12 women);hypertension     Objective:     Vitals: BP (!) 160/86 (BP Location: Right Arm, Patient Position: Sitting, Cuff Size: Normal)   Pulse 68   Temp 98.1 F (36.7 C) (Oral)   Resp 14   Ht 4' 11.5" (1.511 m)   Wt 143 lb (64.9 kg)   SpO2 97%   BMI 28.40 kg/m   Body mass index is 28.4 kg/m.   Tobacco History  Smoking Status  . Former Smoker  . Types: Cigarettes  . Quit date: 07/21/2000  Smokeless Tobacco  . Never Used     Counseling given: Not Answered   Past Medical History:  Diagnosis Date  . Arthritis    left knee  . Hypertension   . Hypothyroidism   . Inappropriate ADH secretion (HCC)   . Osteoporosis    Past Surgical History:  Procedure Laterality Date  . CATARACT EXTRACTION W/PHACO Left 05/29/2015   Procedure: CATARACT EXTRACTION PHACO AND INTRAOCULAR LENS PLACEMENT (IOC);  Surgeon: Leandrew Koyanagi, MD;  Location: [facility];  Service: Ophthalmology;  Laterality: Left;  . CATARACT EXTRACTION W/PHACO Right 01/15/2016   Procedure: CATARACT EXTRACTION PHACO AND INTRAOCULAR LENS PLACEMENT (IOC);  Surgeon: Leandrew Koyanagi, MD;  Location: [facility];  Service: Ophthalmology;  Laterality: Right;  SHUGARCAINE   Family History  Problem Relation Age of Onset  . Heart attack Mother   . Cancer Neg Hx   . COPD Neg Hx     no breast, ovarian or colon   History  Sexual Activity  . Sexual activity: No    Outpatient Encounter Prescriptions as of 07/29/2016  Medication Sig  . aspirin 81 MG tablet Take 81 mg by mouth daily.  . cholecalciferol (VITAMIN D) 1000 UNITS tablet Take 1,000 Units by mouth daily.  Marland Kitchen labetalol (NORMODYNE) 300 MG tablet Take 0.5 tablets (150 mg total) by mouth 3 (three) times daily. 1/2  tab (150 mg) Am and PM  . levothyroxine (SYNTHROID, LEVOTHROID) 88 MCG tablet TAKE ONE (1) TABLET EACH DAY  . losartan (COZAAR) 100 MG tablet TAKE ONE (1) TABLET BY MOUTH EVERY DAY  . Multiple Vitamins-Minerals (PRESERVISION AREDS PO) Take by mouth daily.  . simvastatin (ZOCOR) 40 MG tablet TAKE ONE TABLET BY MOUTH EVERY NIGHT AT BEDTIME   No facility-administered encounter medications on file as of 07/29/2016.     Activities of Daily Living In your present state of health, do you have any difficulty performing the following activities: 07/29/2016 01/15/2016  Hearing? Y N  Vision? N N  Difficulty concentrating or making decisions? N N  Walking or climbing stairs? Y N  Dressing or bathing? N N  Doing errands, shopping? N -  Preparing Food and eating ? N -  Using the Toilet? N -  In the past six months, have you accidently leaked urine? N -  Do you have problems with loss of bowel control? N -  Managing your Medications? N -  Managing your Finances? N -  Housekeeping or managing your Housekeeping? N -  Some recent data might be hidden    Patient Care Team: Crecencio Mc, MD as PCP - General (Internal Medicine)    Assessment:    This is a routine wellness examination for [place]. The goal of the  wellness visit is to assist the patient how to close the gaps in care and create a preventative care plan for the patient.   Taking calcium VIT D as appropriate/Osteoporosis risk reviewed.  Gets acupuncture every 3 weeks by Dr Oswaldo Milian in [place] [facility] )  Medications reviewed; taking without issues or barriers.  Safety issues reviewed; smoke detectors in the home. No firearms in the home. Wears seatbelts when driving or riding with others. No violence in the home.  There is no risks for hepatitis, STDs or HIV. There is no history of blood transfusion. They have no travel history to infectious disease endemic areas of the world.  No identified risk were noted; The patient was  oriented x 3; appropriate in dress and manner and no objective failures at ADL's or IADL's.   Body mass index; discussed the importance of a healthy diet, water intake and exercise. Educational material provided.  The patient has seen their dentist (Dr. Carman Ching) in the last six month.  She sees her eye doctor every six months.  Health maintenance gaps; closed.  Patient Concerns: None at this time. Follow up with PCP as needed.  Exercise Activities and Dietary recommendations Current Exercise Habits: Structured exercise class, Type of exercise: calisthenics, Time (Minutes): 60, Frequency (Times/Week): 5, Weekly Exercise (Minutes/Week): 300, Intensity: Mild  Goals    . Healthy Lifestyle          STAY HYDRATED AND DRINK FLUIDS/WATER WITHIN RANGE OF RESTRICTION, AS DIRECTED. LOW CARB FOODS. STAY ACTIVE AND MAINTAIN EXERCISE ROUTINE.      Fall Risk Fall Risk  07/29/2016 08/30/2015  Falls in the past year? No No   Depression Screen PHQ 2/9 Scores 07/29/2016 08/30/2015  PHQ - 2 Score 0 0     Cognitive Testing MMSE - Mini Mental State Exam 07/29/2016  Orientation to time 5  Orientation to Place 5  Registration 3  Attention/ Calculation 5  Recall 3  Language- name 2 objects 2  Language- repeat 1  Language- follow 3 step command 3  Language- read & follow direction 1  Write a sentence 1  Copy design 1  Total score 30    Immunization History  Administered Date(s) Administered  . Influenza Split 09/28/2014  . Influenza Whole 10/27/2011  . Influenza,inj,Quad PF,36+ Mos 08/30/2015  . Influenza-Unspecified 10/28/2012, 09/27/2013  . Pneumococcal Conjugate-13 08/28/2014  . Pneumococcal Polysaccharide-23 01/24/2013  . Tdap 08/30/2014  . Zoster 11/13/2014   Screening Tests Health Maintenance  Topic Date Due  . INFLUENZA VACCINE  07/28/2016  . TETANUS/TDAP  08/30/2024  . DEXA SCAN  Completed  . ZOSTAVAX  Completed  . PNA vac Low Risk Adult  Completed      Plan:   End of life  planning; Advance aging; Advanced directives discussed. Copy of current HCPOA/Living Will on file.  During the course of the visit the patient was educated and counseled about the following appropriate screening and preventive services:   Vaccines to include Pneumoccal, Influenza, Hepatitis B, Td, Zostavax, HCV  Electrocardiogram  Cardiovascular Disease  Colorectal cancer screening  Bone density screening  Diabetes screening  Glaucoma screening  Mammography/PAP  Nutrition counseling   Patient Instructions (the written plan) was given to the patient.   Varney Biles, LPN  [id number]

## 2016-07-30 NOTE — Progress Notes (Signed)
  I have reviewed the above information and agree with above.   Teresa Tullo, MD 

## 2016-08-05 DIAGNOSIS — N2581 Secondary hyperparathyroidism of renal origin: Secondary | ICD-10-CM | POA: Diagnosis not present

## 2016-08-05 DIAGNOSIS — N183 Chronic kidney disease, stage 3 (moderate): Secondary | ICD-10-CM | POA: Diagnosis not present

## 2016-08-05 DIAGNOSIS — R809 Proteinuria, unspecified: Secondary | ICD-10-CM | POA: Diagnosis not present

## 2016-08-05 DIAGNOSIS — E871 Hypo-osmolality and hyponatremia: Secondary | ICD-10-CM | POA: Diagnosis not present

## 2016-08-06 LAB — MICROALBUMIN, URINE: MICROALB UR: 11.3

## 2016-08-06 LAB — CBC AND DIFFERENTIAL
HCT: 40 % (ref 36–46)
HEMOGLOBIN: 13.3 g/dL (ref 12.0–16.0)
Platelets: 321 10*3/uL (ref 150–399)
WBC: 6.6 10^3/mL

## 2016-08-06 LAB — BASIC METABOLIC PANEL
BUN: 16 mg/dL (ref 4–21)
Creatinine: 0.8 mg/dL (ref 0.5–1.1)
Glucose: 110 mg/dL
POTASSIUM: 4.7 mmol/L (ref 3.4–5.3)
SODIUM: 136 mmol/L — AB (ref 137–147)

## 2016-08-06 LAB — HEPATIC FUNCTION PANEL
ALT: 17 U/L (ref 7–35)
AST: 22 U/L (ref 13–35)
Alkaline Phosphatase: 159 U/L — AB (ref 25–125)
BILIRUBIN, TOTAL: 0.3 mg/dL

## 2016-08-21 ENCOUNTER — Other Ambulatory Visit: Payer: Self-pay | Admitting: Internal Medicine

## 2016-08-24 DIAGNOSIS — H353132 Nonexudative age-related macular degeneration, bilateral, intermediate dry stage: Secondary | ICD-10-CM | POA: Diagnosis not present

## 2016-09-02 ENCOUNTER — Ambulatory Visit (INDEPENDENT_AMBULATORY_CARE_PROVIDER_SITE_OTHER): Payer: Medicare Other | Admitting: Internal Medicine

## 2016-09-02 ENCOUNTER — Encounter: Payer: Self-pay | Admitting: Internal Medicine

## 2016-09-02 DIAGNOSIS — E038 Other specified hypothyroidism: Secondary | ICD-10-CM | POA: Diagnosis not present

## 2016-09-02 DIAGNOSIS — E785 Hyperlipidemia, unspecified: Secondary | ICD-10-CM | POA: Diagnosis not present

## 2016-09-02 DIAGNOSIS — R030 Elevated blood-pressure reading, without diagnosis of hypertension: Secondary | ICD-10-CM | POA: Diagnosis not present

## 2016-09-02 DIAGNOSIS — IMO0001 Reserved for inherently not codable concepts without codable children: Secondary | ICD-10-CM

## 2016-09-02 DIAGNOSIS — E871 Hypo-osmolality and hyponatremia: Secondary | ICD-10-CM | POA: Diagnosis not present

## 2016-09-02 DIAGNOSIS — E034 Atrophy of thyroid (acquired): Secondary | ICD-10-CM

## 2016-09-02 NOTE — Progress Notes (Signed)
Pre visit review using our clinic review tool, if applicable. No additional management support is needed unless otherwise documented below in the visit note. 

## 2016-09-02 NOTE — Progress Notes (Signed)
Subjective:  Patient ID: Brittney Jackson, female    DOB: 08/07/1930  Age: 82 y.o. MRN: [id number]  CC: Diagnoses of White coat hypertension, Hypothyroidism due to acquired atrophy of thyroid, Hyperlipidemia with target LDL less than 100, and Hyponatremia were pertinent to this visit.  HPI Brittney Jackson presents for follow up on hypertension , hypothyroidism and hyponatremia.  Patient is taking her medications as prescribed and notes no adverse effects.  Home BP readings have been done about once per week and are  generally < 130/80 .  She is  and walking regularly about 3 times per week for exercise and attending [employer] classes twice per week.   . Labs reviewed per Nephrology done on August 10   Hyponatremia resolved.  Her Cr is stable.  She has persistently elevated alk phos chronic since childhood,  Doe snot want any further workup.  getting flu vaccine at pharmacy  (Flu vaccine deferred) Seeing [facility] for eye exams.  Had  Cataracts removed June 2016 and  Jan 2017. Has  Some age related MD    Getting acupuncture for left knee for years by Dr Oswaldo Milian  In [place].      Outpatient Medications Prior to Visit  Medication Sig Dispense Refill  . aspirin 81 MG tablet Take 81 mg by mouth daily.    . cholecalciferol (VITAMIN D) 1000 UNITS tablet Take 1,000 Units by mouth daily.    Marland Kitchen labetalol (NORMODYNE) 300 MG tablet Take 0.5 tablets (150 mg total) by mouth 3 (three) times daily. 1/2 tab (150 mg) Am and PM 270 tablet 1  . levothyroxine (SYNTHROID, LEVOTHROID) 88 MCG tablet TAKE ONE (1) TABLET EACH DAY 90 tablet 2  . losartan (COZAAR) 100 MG tablet TAKE ONE (1) TABLET BY MOUTH EVERY DAY 90 tablet 3  . Multiple Vitamins-Minerals (PRESERVISION AREDS PO) Take by mouth daily.    . simvastatin (ZOCOR) 40 MG tablet TAKE ONE TABLET BY MOUTH EVERY NIGHT AT BEDTIME 90 tablet 4   No facility-administered medications prior to visit.     Review of Systems;  Patient denies headache,  fevers, malaise, unintentional weight loss, skin rash, eye pain, sinus congestion and sinus pain, sore throat, dysphagia,  hemoptysis , cough, dyspnea, wheezing, chest pain, palpitations, orthopnea, edema, abdominal pain, nausea, melena, diarrhea, constipation, flank pain, dysuria, hematuria, urinary  Frequency, nocturia, numbness, tingling, seizures,  Focal weakness, Loss of consciousness,  Tremor, insomnia, depression, anxiety, and suicidal ideation.      Objective:  BP 136/80   Pulse 74   Temp 97.8 F (36.6 C) (Oral)   Resp 18   Ht 5' (1.524 m)   Wt 145 lb 8 oz (66 kg)   SpO2 95%   BMI 28.42 kg/m   BP Readings from Last 3 Encounters:  09/02/16 136/80  07/29/16 (!) 160/86  02/28/16 (!) 160/80    Wt Readings from Last 3 Encounters:  09/02/16 145 lb 8 oz (66 kg)  07/29/16 143 lb (64.9 kg)  02/28/16 143 lb 12.8 oz (65.2 kg)    General appearance: alert, cooperative and appears stated age Ears: normal TM's and external ear canals both ears Throat: lips, mucosa, and tongue normal; teeth and gums normal Neck: no adenopathy, no carotid bruit, supple, symmetrical, trachea midline and thyroid not enlarged, symmetric, no tenderness/mass/nodules Back: symmetric, no curvature. ROM normal. No CVA tenderness. Lungs: clear to auscultation bilaterally Heart: regular rate and rhythm, S1, S2 normal, no murmur, click, rub or gallop Abdomen: soft, non-tender; bowel sounds  normal; no masses,  no organomegaly Pulses: 2+ and symmetric Skin: Skin color, texture, turgor normal. No rashes or lesions Lymph nodes: Cervical, supraclavicular, and axillary nodes normal.  Lab Results  Component Value Date   HGBA1C 6.7 (H) 12/28/2013    Lab Results  Component Value Date   CREATININE 0.8 08/06/2016   CREATININE 0.82 02/28/2016   CREATININE 0.9 01/30/2016    Lab Results  Component Value Date   WBC 6.6 08/06/2016   HGB 13.3 08/06/2016   HCT 40 08/06/2016   PLT 321 08/06/2016   GLUCOSE 103 (H)  02/28/2016   CHOL 125 08/28/2014   TRIG 141.0 08/28/2014   HDL 43.20 08/28/2014   LDLDIRECT 67.0 01/24/2013   LDLCALC 54 08/28/2014   ALT 17 08/06/2016   AST 22 08/06/2016   NA 136 (A) 08/06/2016   K 4.7 08/06/2016   CL 101 02/28/2016   CREATININE 0.8 08/06/2016   BUN 16 08/06/2016   CO2 28 02/28/2016   TSH 1.87 02/28/2016   HGBA1C 6.7 (H) 12/28/2013   MICROALBUR 11.3 08/06/2016    No results found.  Assessment & Plan:   Problem List Items Addressed This Visit    White coat hypertension    Well controlled on current regimen per home readings. Renal function stable, no changes today.  Lab Results  Component Value Date   CREATININE 0.8 08/06/2016   Lab Results  Component Value Date   NA 136 (A) 08/06/2016   K 4.7 08/06/2016   CL 101 02/28/2016   CO2 28 02/28/2016         Hypothyroidism    Thyroid function is WNL on current dose.  No current changes needed.   Lab Results  Component Value Date   TSH 1.87 02/28/2016              Hyperlipidemia with target LDL less than 100    Tolerating simvastatin without myalgias or liver enzyme elevation. Has not had fasting labs in over a year.  Lab Results  Component Value Date   CHOL 125 08/28/2014   HDL 43.20 08/28/2014   LDLCALC 54 08/28/2014   LDLDIRECT 67.0 01/24/2013   TRIG 141.0 08/28/2014   CHOLHDL 3 08/28/2014   Lab Results  Component Value Date   ALT 17 08/06/2016   AST 22 08/06/2016   ALKPHOS 159 (A) 08/06/2016   BILITOT 0.4 02/27/2015         Hyponatremia    Secondary to SIADH,  Since April 2015.  Currently resolved.       Other Visit Diagnoses   None.    .[id number] I am having Ms. [name] maintain her cholecalciferol, aspirin, simvastatin, losartan, Multiple Vitamins-Minerals (PRESERVISION AREDS PO), labetalol, and levothyroxine.  No orders of the defined types were placed in this encounter.   There are no discontinued medications.  Follow-up: Return in about 6 months (around  03/02/2017), or annual exam .   Crecencio Mc, MD

## 2016-09-02 NOTE — Patient Instructions (Signed)
You are doing well!   See you in 6 month

## 2016-09-03 NOTE — Assessment & Plan Note (Signed)
Well controlled on current regimen per home readings. Renal function stable, no changes today.  Lab Results  Component Value Date   CREATININE 0.8 08/06/2016   Lab Results  Component Value Date   NA 136 (A) 08/06/2016   K 4.7 08/06/2016   CL 101 02/28/2016   CO2 28 02/28/2016

## 2016-09-03 NOTE — Assessment & Plan Note (Signed)
Thyroid function is WNL on current dose.  No current changes needed.   Lab Results  Component Value Date   TSH 1.87 02/28/2016

## 2016-09-03 NOTE — Assessment & Plan Note (Signed)
Tolerating simvastatin without myalgias or liver enzyme elevation. Has not had fasting labs in over a year.  Lab Results  Component Value Date   CHOL 125 08/28/2014   HDL 43.20 08/28/2014   LDLCALC 54 08/28/2014   LDLDIRECT 67.0 01/24/2013   TRIG 141.0 08/28/2014   CHOLHDL 3 08/28/2014   Lab Results  Component Value Date   ALT 17 08/06/2016   AST 22 08/06/2016   ALKPHOS 159 (A) 08/06/2016   BILITOT 0.4 02/27/2015

## 2016-09-03 NOTE — Assessment & Plan Note (Signed)
Secondary to SIADH,  Since April 2015.  Currently resolved.

## 2016-09-14 ENCOUNTER — Other Ambulatory Visit: Payer: Self-pay | Admitting: Internal Medicine

## 2016-09-14 NOTE — Telephone Encounter (Signed)
Last Lipid 2015 lat hepatic 8/17 ok to fill?

## 2016-10-06 DIAGNOSIS — Z23 Encounter for immunization: Secondary | ICD-10-CM | POA: Diagnosis not present

## 2016-10-12 ENCOUNTER — Other Ambulatory Visit: Payer: Self-pay | Admitting: Internal Medicine

## 2016-12-23 ENCOUNTER — Other Ambulatory Visit: Payer: Self-pay | Admitting: Internal Medicine

## 2017-01-25 DIAGNOSIS — R509 Fever, unspecified: Secondary | ICD-10-CM | POA: Diagnosis not present

## 2017-01-25 DIAGNOSIS — R05 Cough: Secondary | ICD-10-CM | POA: Diagnosis not present

## 2017-02-22 DIAGNOSIS — H353132 Nonexudative age-related macular degeneration, bilateral, intermediate dry stage: Secondary | ICD-10-CM | POA: Diagnosis not present

## 2017-02-26 DIAGNOSIS — N2581 Secondary hyperparathyroidism of renal origin: Secondary | ICD-10-CM | POA: Diagnosis not present

## 2017-02-26 DIAGNOSIS — N182 Chronic kidney disease, stage 2 (mild): Secondary | ICD-10-CM | POA: Diagnosis not present

## 2017-02-26 DIAGNOSIS — C155 Malignant neoplasm of lower third of esophagus: Secondary | ICD-10-CM | POA: Diagnosis not present

## 2017-02-26 DIAGNOSIS — I10 Essential (primary) hypertension: Secondary | ICD-10-CM | POA: Diagnosis not present

## 2017-02-26 DIAGNOSIS — E871 Hypo-osmolality and hyponatremia: Secondary | ICD-10-CM | POA: Diagnosis not present

## 2017-02-26 LAB — BASIC METABOLIC PANEL
BUN: 17 mg/dL (ref 4–21)
Creatinine: 0.9 mg/dL (ref 0.5–1.1)
GLUCOSE: 107 mg/dL
Potassium: 4.3 mmol/L (ref 3.4–5.3)
SODIUM: 139 mmol/L (ref 137–147)

## 2017-02-26 LAB — LIPID PANEL
Cholesterol: 137 mg/dL (ref 0–200)
HDL: 37 mg/dL (ref 35–70)
LDL CALC: 72 mg/dL
Triglycerides: 139 mg/dL (ref 40–160)

## 2017-02-26 LAB — TSH: TSH: 2.5 u[IU]/mL (ref 0.41–5.90)

## 2017-02-26 LAB — HEPATIC FUNCTION PANEL
ALK PHOS: 166 U/L — AB (ref 25–125)
ALT: 15 U/L (ref 7–35)
AST: 20 U/L (ref 13–35)
BILIRUBIN, TOTAL: 0.2 mg/dL

## 2017-02-26 LAB — CBC AND DIFFERENTIAL
HCT: 38 % (ref 36–46)
HEMOGLOBIN: 12.8 g/dL (ref 12.0–16.0)
PLATELETS: 412 10*3/uL — AB (ref 150–399)
WBC: 7.3 10^3/mL

## 2017-02-26 LAB — VITAMIN D 25 HYDROXY (VIT D DEFICIENCY, FRACTURES): VIT D 25 HYDROXY: 55.5

## 2017-02-26 LAB — MICROALBUMIN, URINE: Microalb, Ur: 29.7

## 2017-03-03 ENCOUNTER — Encounter: Payer: Self-pay | Admitting: Internal Medicine

## 2017-03-03 ENCOUNTER — Ambulatory Visit (INDEPENDENT_AMBULATORY_CARE_PROVIDER_SITE_OTHER): Payer: Medicare Other | Admitting: Internal Medicine

## 2017-03-03 VITALS — BP 160/80 | HR 77 | Resp 16 | Ht 60.0 in | Wt 133.0 lb

## 2017-03-03 DIAGNOSIS — E034 Atrophy of thyroid (acquired): Secondary | ICD-10-CM

## 2017-03-03 DIAGNOSIS — Z0000 Encounter for general adult medical examination without abnormal findings: Secondary | ICD-10-CM | POA: Diagnosis not present

## 2017-03-03 DIAGNOSIS — Z1231 Encounter for screening mammogram for malignant neoplasm of breast: Secondary | ICD-10-CM | POA: Diagnosis not present

## 2017-03-03 DIAGNOSIS — Z1239 Encounter for other screening for malignant neoplasm of breast: Secondary | ICD-10-CM

## 2017-03-03 DIAGNOSIS — R1310 Dysphagia, unspecified: Secondary | ICD-10-CM

## 2017-03-03 DIAGNOSIS — R1314 Dysphagia, pharyngoesophageal phase: Secondary | ICD-10-CM | POA: Diagnosis not present

## 2017-03-03 DIAGNOSIS — R1319 Other dysphagia: Secondary | ICD-10-CM

## 2017-03-03 DIAGNOSIS — E785 Hyperlipidemia, unspecified: Secondary | ICD-10-CM | POA: Diagnosis not present

## 2017-03-03 MED ORDER — HYOSCYAMINE SULFATE 0.125 MG SL SUBL: 0.1250 mg | SUBLINGUAL_TABLET | SUBLINGUAL | 0 refills | Status: DC | PRN

## 2017-03-03 NOTE — Progress Notes (Signed)
Pre visit review using our clinic review tool, if applicable. No additional management support is needed unless otherwise documented below in the visit note. 

## 2017-03-03 NOTE — Patient Instructions (Addendum)
I have ordered an esophageal study and a GI referral to investigate your dysphagia  You can try taking the hyoscyamine tablet (dissolve under tongue) before your meals to see if it prevents your symptoms   Dysphagia Dysphagia is trouble swallowing. This condition occurs when solids and liquids stick in a person's throat on the way down to the stomach, or when food takes longer to get to the stomach. You may have problems swallowing food, liquids, or both. You may also have pain while trying to swallow. It may take you more time and effort to swallow something. What are the causes? This condition is caused by:  Problems with the muscles. They may make it difficult for you to move food and liquids through the tube that connects your mouth to your stomach (esophagus). You may have ulcers, scar tissue, or inflammation that blocks the normal passage of food and liquids. Causes of these problems include:  Acid reflux from your stomach into your esophagus (gastroesophageal reflux).  Infections.  Radiation treatment for cancer.  Medicines taken without enough fluids to wash them down into your stomach.  Nerve problems. These prevent signals from being sent to the muscles of your esophagus to squeeze (contract) and move what you swallow down to your stomach.  Globus pharyngeus. This is a common problem that involves feeling like something is stuck in the throat or a sense of trouble with swallowing even though nothing is wrong with the swallowing passages.  Stroke. This can affect the nerves and make it difficult to swallow.  Certain conditions, such as cerebral palsy or Parkinson disease. What are the signs or symptoms? Common symptoms of this condition include:  A feeling that solids or liquids are stuck in your throat on the way down to the stomach.  Food taking too long to get to the stomach. Other symptoms include:  Food moving back from your stomach to your mouth  (regurgitation).  Noises coming from your throat.  Chest discomfort with swallowing.  A feeling of fullness when swallowing.  Drooling, especially when the throat is blocked.  Pain while swallowing.  Heartburn.  Coughing or gagging while trying to swallow. How is this diagnosed? This condition is diagnosed by:  Barium X-ray. In this test, you swallow a white substance (contrast medium)that sticks to the inside of your esophagus. X-ray images are then taken.  Endoscopy. In this test, a flexible telescope is inserted down your throat to look at your esophagus and your stomach.  CT scans and MRI. How is this treated? Treatment for dysphagia depends on the cause of the condition:  If the dysphagia is caused by acid reflux or infection, medicines may be used. They may include antibiotics and heartburn medicines.  If the dysphagia is caused by problems with your muscles, swallowing therapy may be used to help you strengthen your swallowing muscles. You may have to do specific exercises to strengthen the muscles or stretch them.  If the dysphagia is caused by a blockage or mass, procedures to remove the blockage may be done. You may need surgery and a feeding tube. You may need to make diet changes. Ask your health care provider for specific instructions. Follow these instructions at home: Eating and drinking   Try to eat soft food that is easier to swallow.  Follow any diet changes as told by your health care provider.  Cut your food into small pieces and eat slowly.  Eat and drink only when you are sitting upright.  Do not  drink alcohol or caffeine. If you need help quitting, ask your health care provider. General instructions   Check your weight every day to make sure you are not losing weight.  Take over-the-counter and prescription medicines only as told by your health care provider.  If you were prescribed an antibiotic medicine, take it as told by your health care  provider. Do not stop taking the antibiotic even if you start to feel better.  Do not use any products that contain nicotine or tobacco, such as cigarettes and e-cigarettes. If you need help quitting, ask your health care provider.  Keep all follow-up visits as told by your health care provider. This is important. Contact a health care provider if:  You lose weight because you cannot swallow.  You cough when you drink liquids (aspiration).  You cough up partially digested food. Get help right away if:  You cannot swallow your saliva.  You have shortness of breath or a fever, or both.  You have a hoarse voice and also have trouble swallowing. Summary  Dysphagia is trouble swallowing. This condition occurs when solids and liquids stick in a person's throat on the way down to the stomach, or when food takes longer to get to the stomach.  Dysphagia has many possible causes and symptoms.  Treatment for dysphagia depends on the cause of the condition. This information is not intended to replace advice given to you by your health care provider. Make sure you discuss any questions you have with your health care provider. Document Released: 12/11/2000 Document Revised: 12/03/2016 Document Reviewed: 12/03/2016 Elsevier Interactive Patient Education  2017 [employer].

## 2017-03-03 NOTE — Progress Notes (Signed)
Subjective:  Patient ID: Brittney Jackson, female    DOB: 09/23/1930  Age: 83 y.o. MRN: [id number]  CC: The primary encounter diagnosis was Dysphagia, pharyngoesophageal phase. Diagnoses of Routine general medical examination at a health care facility, Hypothyroidism due to acquired atrophy of thyroid, Hyperlipidemia with target LDL less than 100, Esophageal dysphagia, and Breast cancer screening were also pertinent to this visit.  HPI    [name] presents for FOLLOW UP and evaluation of new and chronic issues.    Health screenings reviewed: last mammogram 2016. No more wanted given age  41 [place] dentist in April No dermatology follow up wanted  Going to the fitness center 5 days per week  Cc:  Dysphagia with 3-4  episodes of regurgitation and weight loss.  Symptoms started in mid January but evaluation was delayed by having a LRI in late January , which was treated at urgent care with  10 days of doxycycline (had nausea the entire time) .    feels her esophagus "lock up" and points to a  Substernal location just above the xiphoid process. Happens with sips of water.  Has to eat very slowly.  Burping helps, .  Has been able to take her pills two at a time before  The she has to stop  Denies cough with eating.  Denies reflux symptoms prior to onset   .    Lab Results  Component Value Date   TSH 2.50 02/26/2017       Outpatient Medications Prior to Visit  Medication Sig Dispense Refill  . aspirin 81 MG tablet Take 81 mg by mouth daily.    . cholecalciferol (VITAMIN D) 1000 UNITS tablet Take 1,000 Units by mouth daily.    Marland Kitchen labetalol (NORMODYNE) 300 MG tablet Take 0.5 tablets (150 mg total) by mouth 3 (three) times daily. 1/2 tab (150 mg) Am and PM (Patient taking differently: Take 150 mg by mouth 2 (two) times daily. 1/2 tab (150 mg) Am and PM) 270 tablet 1  . levothyroxine (SYNTHROID, LEVOTHROID) 88 MCG tablet TAKE ONE (1) TABLET EACH DAY 90 tablet 2   . losartan (COZAAR) 100 MG tablet TAKE ONE (1) TABLET BY MOUTH EVERY DAY 90 tablet 1  . Multiple Vitamins-Minerals (PRESERVISION AREDS PO) Take by mouth daily.    . simvastatin (ZOCOR) 40 MG tablet TAKE ONE (1) TABLET AT BEDTIME 90 tablet 1  . labetalol (NORMODYNE) 300 MG tablet TAKE ONE (1) TABLET THREE (3) TIMES EACH DAY 270 tablet 0   No facility-administered medications prior to visit.     Review of Systems;  Patient denies headache, fevers, malaise,  skin rash, eye pain, sinus congestion and sinus pain, sore throat,  hemoptysis , cough, dyspnea, wheezing, chest pain, palpitations, orthopnea, edema, abdominal pain, nausea, melena, diarrhea, constipation, flank pain, dysuria, hematuria, urinary  Frequency, nocturia, numbness, tingling, seizures,  Focal weakness, Loss of consciousness,  Tremor, insomnia, depression, anxiety, and suicidal ideation.      Objective:  BP (!) 160/80   Pulse 77   Resp 16   Ht 5' (1.524 m)   Wt 133 lb (60.3 kg)   SpO2 93%   BMI 25.97 kg/m   BP Readings from Last 3 Encounters:  03/03/17 (!) 160/80  09/02/16 136/80  07/29/16 (!) 160/86    Wt Readings from Last 3 Encounters:  03/03/17 133 lb (60.3 kg)  09/02/16 145 lb 8 oz (66 kg)  07/29/16 143 lb (64.9 kg)  General appearance: alert, cooperative and appears stated age Ears: normal TM's and external ear canals both ears Throat: lips, mucosa, and tongue normal; teeth and gums normal Neck: no adenopathy, no carotid bruit, supple, symmetrical, trachea midline and thyroid not enlarged, symmetric, no tenderness/mass/nodules Back: symmetric, no curvature. ROM normal. No CVA tenderness. Lungs: clear to auscultation bilaterally Heart: regular rate and rhythm, S1, S2 normal, no murmur, click, rub or gallop Abdomen: soft, non-tender; bowel sounds normal; no masses,  no organomegaly Pulses: 2+ and symmetric Skin: Skin color, texture, turgor normal. No rashes or lesions Lymph nodes: Cervical,  supraclavicular, and axillary nodes normal.  Lab Results  Component Value Date   HGBA1C 6.7 (H) 12/28/2013    Lab Results  Component Value Date   CREATININE 0.9 02/26/2017   CREATININE 0.8 08/06/2016   CREATININE 0.82 02/28/2016    Lab Results  Component Value Date   WBC 7.3 02/26/2017   HGB 12.8 02/26/2017   HCT 38 02/26/2017   PLT 412 (A) 02/26/2017   GLUCOSE 103 (H) 02/28/2016   CHOL 137 02/26/2017   TRIG 139 02/26/2017   HDL 37 02/26/2017   LDLDIRECT 67.0 01/24/2013   LDLCALC 72 02/26/2017   ALT 15 02/26/2017   AST 20 02/26/2017   NA 139 02/26/2017   K 4.3 02/26/2017   CL 101 02/28/2016   CREATININE 0.9 02/26/2017   BUN 17 02/26/2017   CO2 28 02/28/2016   TSH 2.50 02/26/2017   HGBA1C 6.7 (H) 12/28/2013   MICROALBUR 29.7 02/26/2017    No results found.  Assessment & Plan:   Problem List Items Addressed This Visit    Breast cancer screening    She has declined annual mammography and breast exam.       Dysphagia    New onset ,  With episodes occurring daily with both liquids and solid.  esophagreal spasm  vs stricture,  Dg esophagus and GI follow up ordered. Trial of Levsin pre meal.       Hyperlipidemia with target LDL less than 100    Tolerating simvastatin without myalgias or liver enzyme elevation. LDL is at goal   Lab Results  Component Value Date   CHOL 137 02/26/2017   HDL 37 02/26/2017   LDLCALC 72 02/26/2017   LDLDIRECT 67.0 01/24/2013   TRIG 139 02/26/2017   CHOLHDL 3 08/28/2014   Lab Results  Component Value Date   ALT 15 02/26/2017   AST 20 02/26/2017   ALKPHOS 166 (A) 02/26/2017   BILITOT 0.4 02/27/2015         Hypothyroidism    Thyroid function is WNL on current dose.  No current changes needed.   Lab Results  Component Value Date   TSH 2.50 02/26/2017              Routine general medical examination at a health care facility    Annual comprehensive  exam was done as well as an evaluation and management of  chronic conditions .  During the course of the visit the patient was educated and counseled about appropriate screening and preventive services including :  diabetes screening, lipid analysis with projected  10 year  risk for CAD , nutrition counseling, breast, cervical and colorectal cancer screening, and recommended immunizations.  Printed recommendations for health maintenance screenings was given       Other Visit Diagnoses    Dysphagia, pharyngoesophageal phase    -  Primary   Relevant Orders   DG Esophagus   Ambulatory  referral to Gastroenterology      I am having Ms. Deroo start on hyoscyamine. I am also having her maintain her cholecalciferol, aspirin, Multiple Vitamins-Minerals (PRESERVISION AREDS PO), labetalol, levothyroxine, simvastatin, and losartan.  Meds ordered this encounter  Medications  . hyoscyamine (LEVSIN SL) 0.125 MG SL tablet    Sig: Place 1 tablet (0.125 mg total) under the tongue every 4 (four) hours as needed.    Dispense:  30 tablet    Refill:  0    Medications Discontinued During This Encounter  Medication Reason  . labetalol (NORMODYNE) 300 MG tablet Dose change   A total of 40 minutes was spent with patient more than half of which was spent in counseling patient on the above mentioned issues , reviewing and explaining recent labs and imaging studies done, and coordination of care.  Follow-up: No Follow-up on file.   Crecencio Mc, MD

## 2017-03-06 DIAGNOSIS — R1310 Dysphagia, unspecified: Secondary | ICD-10-CM | POA: Insufficient documentation

## 2017-03-06 DIAGNOSIS — Z1239 Encounter for other screening for malignant neoplasm of breast: Secondary | ICD-10-CM | POA: Insufficient documentation

## 2017-03-06 NOTE — Assessment & Plan Note (Signed)
Annual comprehensive  exam was done as well as an evaluation and management of chronic conditions .  During the course of the visit the patient was educated and counseled about appropriate screening and preventive services including :  diabetes screening, lipid analysis with projected  10 year  risk for CAD , nutrition counseling, breast, cervical and colorectal cancer screening, and recommended immunizations.  Printed recommendations for health maintenance screenings was given 

## 2017-03-06 NOTE — Assessment & Plan Note (Addendum)
Home readings have all been normal .  Kidney function stable.  No changes today.  Lab Results  Component Value Date   CREATININE 0.9 02/26/2017   Lab Results  Component Value Date   NA 139 02/26/2017   K 4.3 02/26/2017   CL 101 02/28/2016   CO2 28 02/28/2016   Lab Results  Component Value Date   MICROALBUR 29.7 02/26/2017

## 2017-03-06 NOTE — Assessment & Plan Note (Signed)
Thyroid function is WNL on current dose.  No current changes needed.   Lab Results  Component Value Date   TSH 2.50 02/26/2017

## 2017-03-06 NOTE — Assessment & Plan Note (Signed)
She has declined annual mammography and breast exam.

## 2017-03-06 NOTE — Assessment & Plan Note (Signed)
Tolerating simvastatin without myalgias or liver enzyme elevation. LDL is at goal   Lab Results  Component Value Date   CHOL 137 02/26/2017   HDL 37 02/26/2017   LDLCALC 72 02/26/2017   LDLDIRECT 67.0 01/24/2013   TRIG 139 02/26/2017   CHOLHDL 3 08/28/2014   Lab Results  Component Value Date   ALT 15 02/26/2017   AST 20 02/26/2017   ALKPHOS 166 (A) 02/26/2017   BILITOT 0.4 02/27/2015

## 2017-03-06 NOTE — Assessment & Plan Note (Addendum)
New onset ,  With episodes occurring daily with both liquids and solid.  esophagreal spasm  vs stricture,  Dg esophagus and GI follow up ordered. Trial of Levsin pre meal.

## 2017-03-14 ENCOUNTER — Emergency Department
Admission: EM | Admit: 2017-03-14 | Discharge: 2017-03-14 | Disposition: A | Discharge status: Home / Self Care | Payer: Medicare Other | Attending: Emergency Medicine | Admitting: Emergency Medicine

## 2017-03-14 ENCOUNTER — Encounter: Payer: Self-pay | Admitting: Emergency Medicine

## 2017-03-14 ENCOUNTER — Emergency Department: Payer: Medicare Other

## 2017-03-14 DIAGNOSIS — R112 Nausea with vomiting, unspecified: Secondary | ICD-10-CM | POA: Insufficient documentation

## 2017-03-14 DIAGNOSIS — E039 Hypothyroidism, unspecified: Secondary | ICD-10-CM | POA: Diagnosis not present

## 2017-03-14 DIAGNOSIS — N281 Cyst of kidney, acquired: Secondary | ICD-10-CM | POA: Diagnosis not present

## 2017-03-14 DIAGNOSIS — I10 Essential (primary) hypertension: Secondary | ICD-10-CM | POA: Diagnosis not present

## 2017-03-14 DIAGNOSIS — Z79899 Other long term (current) drug therapy: Secondary | ICD-10-CM | POA: Insufficient documentation

## 2017-03-14 DIAGNOSIS — Z7982 Long term (current) use of aspirin: Secondary | ICD-10-CM | POA: Diagnosis not present

## 2017-03-14 DIAGNOSIS — R1310 Dysphagia, unspecified: Secondary | ICD-10-CM | POA: Diagnosis not present

## 2017-03-14 DIAGNOSIS — K222 Esophageal obstruction: Secondary | ICD-10-CM | POA: Insufficient documentation

## 2017-03-14 DIAGNOSIS — N2889 Other specified disorders of kidney and ureter: Secondary | ICD-10-CM | POA: Diagnosis not present

## 2017-03-14 DIAGNOSIS — Z87891 Personal history of nicotine dependence: Secondary | ICD-10-CM | POA: Insufficient documentation

## 2017-03-14 LAB — COMPREHENSIVE METABOLIC PANEL
ALBUMIN: 3.9 g/dL (ref 3.5–5.0)
ALT: 17 U/L (ref 14–54)
AST: 25 U/L (ref 15–41)
Alkaline Phosphatase: 144 U/L — ABNORMAL HIGH (ref 38–126)
Anion gap: 3 — ABNORMAL LOW (ref 5–15)
BUN: 20 mg/dL (ref 6–20)
CALCIUM: 9.4 mg/dL (ref 8.9–10.3)
CHLORIDE: 106 mmol/L (ref 101–111)
CO2: 30 mmol/L (ref 22–32)
CREATININE: 0.91 mg/dL (ref 0.44–1.00)
GFR calc Af Amer: >60 mL/min (ref >60)
GFR, EST NON AFRICAN AMERICAN: 56 mL/min — AB (ref >60)
GLUCOSE: 119 mg/dL — AB (ref 65–99)
Potassium: 3.8 mmol/L (ref 3.5–5.1)
Sodium: 139 mmol/L (ref 135–145)
TOTAL PROTEIN: 7.3 g/dL (ref 6.5–8.1)
Total Bilirubin: 0.8 mg/dL (ref 0.3–1.2)

## 2017-03-14 LAB — URINALYSIS, COMPLETE (UACMP) WITH MICROSCOPIC
Bacteria, UA: NONE SEEN
Bilirubin Urine: NEGATIVE
GLUCOSE, UA: NEGATIVE mg/dL
HGB URINE DIPSTICK: NEGATIVE
Ketones, ur: 20 mg/dL — AB
Leukocytes, UA: NEGATIVE
NITRITE: NEGATIVE
PH: 5 (ref 5.0–8.0)
Protein, ur: 30 mg/dL — AB
Specific Gravity, Urine: 1.017 (ref 1.005–1.030)

## 2017-03-14 LAB — CBC
HCT: 39 % (ref 35.0–47.0)
HEMOGLOBIN: 13.3 g/dL (ref 12.0–16.0)
MCH: 29.1 pg (ref 26.0–34.0)
MCHC: 34.2 g/dL (ref 32.0–36.0)
MCV: 85.2 fL (ref 80.0–100.0)
PLATELETS: 319 10*3/uL (ref 150–440)
RBC: 4.58 MIL/uL (ref 3.80–5.20)
RDW: 15.1 % — ABNORMAL HIGH (ref 11.5–14.5)
WBC: 6.8 10*3/uL (ref 3.6–11.0)

## 2017-03-14 LAB — TROPONIN I: Troponin I: <0.03 ng/mL (ref <0.03)

## 2017-03-14 LAB — MAGNESIUM: Magnesium: 2 mg/dL (ref 1.7–2.4)

## 2017-03-14 LAB — LIPASE, BLOOD: LIPASE: 19 U/L (ref 11–51)

## 2017-03-14 MED ORDER — ONDANSETRON HCL 4 MG/2ML IJ SOLN
4.0000 mg | INTRAMUSCULAR | Status: AC
  Administered 2017-03-14: 4 mg via INTRAVENOUS
  Filled 2017-03-14: qty 2

## 2017-03-14 MED ORDER — IOPAMIDOL (ISOVUE-300) INJECTION 61%
100.0000 mL | Freq: Once | INTRAVENOUS | Status: AC | PRN
  Administered 2017-03-14: 100 mL via INTRAVENOUS

## 2017-03-14 MED ORDER — ONDANSETRON 4 MG PO TBDP: ORAL_TABLET | ORAL | 0 refills | Status: DC

## 2017-03-14 MED ORDER — IOPAMIDOL (ISOVUE-300) INJECTION 61%
30.0000 mL | Freq: Once | INTRAVENOUS | Status: AC
  Administered 2017-03-14: 30 mL via ORAL

## 2017-03-14 MED ORDER — SODIUM CHLORIDE 0.9 % IV BOLUS (SEPSIS)
500.0000 mL | INTRAVENOUS | Status: AC
  Administered 2017-03-14: 500 mL via INTRAVENOUS

## 2017-03-14 NOTE — Discharge Instructions (Signed)
As we discussed, we recommend that you stick to a soft food or even liquid diet (use Ensure or Boost nutritional shakes if needed).  Use the prescribed nausea medication as needed.  Call the office of Dr. Allen Norris and explain that your symptoms are worsening and that you had some abnormal results on your CT scan in the Emergency Department; they may be able to work you in to an appointment before your scheduled visit.  You should also follow up with Dr. Holley Raring to see if he wants to order additional outpatient imaging regarding your renal cyst.    Return to the emergency department if you develop new or worsening symptoms that concern you.

## 2017-03-14 NOTE — ED Notes (Signed)
Pt reports s/x's (nausea and vomiting) have been "very sporadic" since January; states she has an appt with GI on April 2nd. Pt states she has vomited a "dozen times or more" in the last 24 hrs. Pt states she feels like she has "one spot that hurts" in her upper epigastric area and the pain is completely relieved by belching.

## 2017-03-14 NOTE — ED Triage Notes (Signed)
Pt presnets to ED with c/o nausea and emesis that started in January. Pt states she was seen by her PCP and was told to follow up with GI doctor, pt states she has an appointment with GI on April 2, pt states since last night has been repeatedly throwing up and nauseated. Pt states she is no longer able to keep fluids or food down, states has lost 10lbs in 6 weeks. Pt denies any symptoms from her BP being elevated, states hx of "white coat syndrome".

## 2017-03-14 NOTE — ED Notes (Signed)
Pt returned to ED Rm 5 from CT at this time.

## 2017-03-14 NOTE — ED Notes (Signed)
Patient transported to CT at this time. 

## 2017-03-14 NOTE — ED Provider Notes (Signed)
[facility] Emergency Department Provider Note  ____________________________________________   First MD Initiated Contact with Patient 03/14/17 2024     (approximate)  I have reviewed the triage vital signs and the nursing notes.   HISTORY  Chief Complaint Emesis and Nausea    HPI Brittney Jackson is a 83 y.o. female who presents for evaluation of acute onset yesterday of severe and persistent vomiting.  She denies having any abdominal pain but states that every time she tries to eat or drink anything at all she vomits several times at all comes back out.  She has had issues presumably with her esophagus for about two months and she has an appointment to see a GI dctor in 2 weeks for possible esophageal stricture.    however she has never before had issues with persistent vomiting.  Denies fever/chills, CP, SOB, abd pain, dysuria.  Nothing makes symptoms better, any amount of PO intake makes it worse.  NAD sitting here currently.    Significant hypertension, but patient states this is a chronic issue for her and she is very careful to take all her medications.   Past Medical History:  Diagnosis Date  . Arthritis    left knee  . Hypertension   . Hypothyroidism   . Inappropriate ADH secretion (HCC)   . Osteoporosis     Patient Active Problem List   Diagnosis Date Noted  . Dysphagia 03/06/2017  . Breast cancer screening 03/06/2017  . S/P left cataract extraction 09/01/2015  . Insomnia 08/30/2014  . Hyposmolality and/or hyponatremia 01/09/2014  . Routine general medical examination at a health care facility 01/24/2013  . Osteopenia 04/26/2012  . White coat hypertension 01/22/2012  . Hypothyroidism 01/22/2012  . Hyperlipidemia with target LDL less than 100 01/22/2012    Past Surgical History:  Procedure Laterality Date  . CATARACT EXTRACTION W/PHACO Left 05/29/2015   Procedure: CATARACT EXTRACTION PHACO AND INTRAOCULAR LENS PLACEMENT (IOC);   Surgeon: Leandrew Koyanagi, MD;  Location: [place];  Service: Ophthalmology;  Laterality: Left;  . CATARACT EXTRACTION W/PHACO Right 01/15/2016   Procedure: CATARACT EXTRACTION PHACO AND INTRAOCULAR LENS PLACEMENT (IOC);  Surgeon: Leandrew Koyanagi, MD;  Location: [place];  Service: Ophthalmology;  Laterality: Right;  [place]    Prior to Admission medications   Medication Sig Start Date End Date Taking? Authorizing Provider  aspirin 81 MG tablet Take 81 mg by mouth daily.    Historical Provider, MD  cholecalciferol (VITAMIN D) 1000 UNITS tablet Take 1,000 Units by mouth daily.    Historical Provider, MD  hyoscyamine (LEVSIN SL) 0.125 MG SL tablet Place 1 tablet (0.125 mg total) under the tongue every 4 (four) hours as needed. 03/03/17   Crecencio Mc, MD  labetalol (NORMODYNE) 300 MG tablet Take 0.5 tablets (150 mg total) by mouth 3 (three) times daily. 1/2 tab (150 mg) Am and PM Patient taking differently: Take 150 mg by mouth 2 (two) times daily. 1/2 tab (150 mg) Am and PM 02/28/16   Crecencio Mc, MD  levothyroxine (SYNTHROID, LEVOTHROID) 88 MCG tablet TAKE ONE (1) TABLET EACH DAY 08/21/16   Crecencio Mc, MD  losartan (COZAAR) 100 MG tablet TAKE ONE (1) TABLET BY MOUTH EVERY DAY 12/23/16   Crecencio Mc, MD  Multiple Vitamins-Minerals (PRESERVISION AREDS PO) Take by mouth daily.    Historical Provider, MD  ondansetron (ZOFRAN ODT) 4 MG disintegrating tablet Allow 1-2 tablets to dissolve in your mouth every 8 hours as needed  for nausea/vomiting 03/14/17   Hinda Kehr, MD  simvastatin (ZOCOR) 40 MG tablet TAKE ONE (1) TABLET AT BEDTIME 09/14/16   Crecencio Mc, MD    Allergies Amlodipine  Family History  Problem Relation Age of Onset  . Heart attack Mother   . Cancer Neg Hx   . COPD Neg Hx     no breast, ovarian or colon    Social History Social History  Substance Use Topics  . Smoking status: Former Smoker    Types: Cigarettes    Quit date:  07/21/2000  . Smokeless tobacco: Never Used  . Alcohol use No    Review of Systems Constitutional: No fever/chills Eyes: No visual changes. ENT: No sore throat. Cardiovascular: Denies chest pain. Respiratory: Denies shortness of breath. Gastrointestinal: No abdominal pain.  No nausea, no vomiting.  No diarrhea.  No constipation. Genitourinary: Negative for dysuria. Musculoskeletal: Negative for back pain. Skin: Negative for rash. Neurological: Negative for headaches, focal weakness or numbness.  10-point ROS otherwise negative.  ____________________________________________   PHYSICAL EXAM:  VITAL SIGNS: ED Triage Vitals  Enc Vitals Group     BP 03/14/17 1752 (!) 206/90     Pulse Rate 03/14/17 1752 82     Resp 03/14/17 1752 18     Temp 03/14/17 1752 98 F (36.7 C)     Temp Source 03/14/17 1752 Oral     SpO2 03/14/17 1752 97 %     Weight 03/14/17 1753 132 lb (59.9 kg)     Height 03/14/17 1753 5' (1.524 m)     Head Circumference --      Peak Flow --      Pain Score 03/14/17 1753 0     Pain Loc --      Pain Edu? --      Excl. in [place]? --     Constitutional: Alert and oriented. Well appearing and in no acute distress. Eyes: Conjunctivae are normal. PERRL. EOMI. Head: Atraumatic. Nose: No congestion/rhinnorhea. Mouth/Throat: Mucous membranes are moist. Neck: No stridor.  No meningeal signs.   Cardiovascular: Normal rate, regular rhythm. Good peripheral circulation. Grossly normal heart sounds. Respiratory: Normal respiratory effort.  No retractions. Lungs CTAB. Gastrointestinal: Soft and nontender. No distention.  Musculoskeletal: No lower extremity tenderness nor edema. No gross deformities of extremities. Neurologic:  Normal speech and language. No gross focal neurologic deficits are appreciated.  Skin:  Skin is warm, dry and intact. No rash noted. Psychiatric: Mood and affect are normal. Speech and behavior are  normal.  ____________________________________________   LABS (all labs ordered are listed, but only abnormal results are displayed)  Labs Reviewed  COMPREHENSIVE METABOLIC PANEL - Abnormal; Notable for the following:       Result Value   Glucose, Bld 119 (*)    Alkaline Phosphatase 144 (*)    GFR calc non Af Amer 56 (*)    Anion gap 3 (*)    All other components within normal limits  CBC - Abnormal; Notable for the following:    RDW 15.1 (*)    All other components within normal limits  URINALYSIS, COMPLETE (UACMP) WITH MICROSCOPIC - Abnormal; Notable for the following:    Color, Urine YELLOW (*)    APPearance CLEAR (*)    Ketones, ur 20 (*)    Protein, ur 30 (*)    Squamous Epithelial / LPF 0-5 (*)    All other components within normal limits  LIPASE, BLOOD  TROPONIN I  MAGNESIUM   ____________________________________________  EKG  None - EKG not ordered by ED physician ____________________________________________  RADIOLOGY   Ct Abdomen Pelvis W Contrast  Result Date: 03/14/2017 CLINICAL DATA:  Difficulty swallowing. EXAM: CT ABDOMEN AND PELVIS WITH CONTRAST TECHNIQUE: Multidetector CT imaging of the abdomen and pelvis was performed using the standard protocol following bolus administration of intravenous contrast. CONTRAST:  123mL ISOVUE-300 IOPAMIDOL (ISOVUE-300) INJECTION 61% COMPARISON:  None. FINDINGS: Lower chest: There is a small hiatal hernia. There is a 2.2 cm segment of marked circumferential mucosal thickening causing a stricture of the distal esophagus. The visualized esophagus upstream to this abnormality is dilated and fluid-filled. Hepatobiliary: Mild hepatic steatosis.  Normal gallbladder. Pancreas: Unremarkable. No pancreatic ductal dilatation or surrounding inflammatory changes. Spleen: Normal in size without focal abnormality. Adrenals/Urinary Tract: Adrenal glands are unremarkable. Right kidney is normal. There is no evidence of hydronephrosis or  nephrolithiasis bilaterally. There is a complex cystic and solid mass within the lateral cortex of the mid pole of the left kidney measuring 2.0 x 2.1 x 2.1 cm . Bladder is unremarkable. Stomach/Bowel: Stomach is within normal limits. No evidence of bowel wall thickening, distention, or inflammatory changes. Sigmoid colonic diverticulosis. Vascular/Lymphatic: Advanced aortic atherosclerosis with calcified and noncalcified plaque. No enlarged abdominal or pelvic lymph nodes. Reproductive: Uterus and bilateral adnexa are unremarkable. Other: No abdominal wall hernia or abnormality. No abdominopelvic ascites. Musculoskeletal: No acute or significant osseous findings. IMPRESSION: Short segment of marked circumferential mucosal thickening causing a stricture of the distal esophagus with upstream esophageal dilation. Malignancy cannot be excluded. Further evaluation with upper endoscopy is recommended. Small hiatal hernia. 2.1 complex left renal mass. Malignancy cannot be excluded. MRI of the abdomen may be considered if further evaluation is desired. Hepatic steatosis. Advanced atherosclerotic disease of the aorta with calcified and noncalcified plaque. Electronically Signed   By: Fidela Salisbury M.D.   On: 03/14/2017 21:56    ____________________________________________   PROCEDURES  Procedure(s) performed:   Procedures   Critical Care performed: No ____________________________________________   INITIAL IMPRESSION / ASSESSMENT AND PLAN / ED COURSE  Pertinent labs & imaging results that were available during my care of the patient were reviewed by me and considered in my medical decision making (see chart for details).  the patient is well-appearing and in no acute distress in spite of her significantly elevated blood pressure.  Her lab results are all unremarkable including normal renal function and no leukocytosis.  I have added on a troponin and magnesium.  SBO unlikely but ileus possible.   No abd tenderness at this time.  Will give Zofran, IV fluids, CT abd/pelvis.  Patient and daughter agree with the plan.   Clinical Course as of Mar 14 2309  Sun Mar 14, 2017  2145 Of note, patient tolerated the entire two bottles of PO contrast without vomiting.  [CF]  2213 Updated patient and daughter about the CT results.  Patient states she feels better and has not vomited.  Gave results including the renal cyst and esophageal issue, both potentially concerning for cancer.  Stressed importance of outpatient GI follow up and possible outpatient MRI.  Sent message through [facility] to Drs. Wohl and [employer].    I gave my usual and customary return precautions.     [CF]    Clinical Course User Index [CF] Hinda Kehr, MD    ____________________________________________  FINAL CLINICAL IMPRESSION(S) / ED DIAGNOSES  Final diagnoses:  Non-intractable vomiting with nausea, unspecified vomiting type  Esophageal stricture  Renal cyst, left  MEDICATIONS GIVEN DURING THIS VISIT:  Medications  ondansetron (ZOFRAN) injection 4 mg (4 mg Intravenous Given 03/14/17 2051)  sodium chloride 0.9 % bolus 500 mL (0 mLs Intravenous Stopped 03/14/17 2124)  iopamidol (ISOVUE-300) 61 % injection 30 mL (30 mLs Oral Contrast Given 03/14/17 2055)  iopamidol (ISOVUE-300) 61 % injection 100 mL (100 mLs Intravenous Contrast Given 03/14/17 2127)     NEW OUTPATIENT MEDICATIONS STARTED DURING THIS VISIT:  New Prescriptions   ONDANSETRON (ZOFRAN ODT) 4 MG DISINTEGRATING TABLET    Allow 1-2 tablets to dissolve in your mouth every 8 hours as needed for nausea/vomiting    Modified Medications   No medications on file    Discontinued Medications   No medications on file     Note:  This document was prepared using Dragon voice recognition software and may include unintentional dictation errors.    Hinda Kehr, MD 03/14/17 [phone]

## 2017-03-15 ENCOUNTER — Other Ambulatory Visit: Payer: Self-pay

## 2017-03-15 DIAGNOSIS — R1319 Other dysphagia: Secondary | ICD-10-CM

## 2017-03-15 DIAGNOSIS — R1310 Dysphagia, unspecified: Secondary | ICD-10-CM

## 2017-03-16 ENCOUNTER — Encounter: Payer: Self-pay | Admitting: *Deleted

## 2017-03-16 ENCOUNTER — Ambulatory Visit
Admission: RE | Admit: 2017-03-16 | Discharge: 2017-03-16 | Disposition: A | Discharge status: Home / Self Care | Payer: Medicare Other | Source: Ambulatory Visit | Attending: Gastroenterology | Admitting: Gastroenterology

## 2017-03-16 ENCOUNTER — Ambulatory Visit: Payer: Medicare Other | Admitting: Anesthesiology

## 2017-03-16 ENCOUNTER — Encounter
Admission: RE | Disposition: A | Discharge status: Home / Self Care | Payer: Self-pay | Source: Ambulatory Visit | Attending: Gastroenterology

## 2017-03-16 DIAGNOSIS — M810 Age-related osteoporosis without current pathological fracture: Secondary | ICD-10-CM | POA: Diagnosis not present

## 2017-03-16 DIAGNOSIS — R1319 Other dysphagia: Secondary | ICD-10-CM

## 2017-03-16 DIAGNOSIS — E039 Hypothyroidism, unspecified: Secondary | ICD-10-CM | POA: Diagnosis not present

## 2017-03-16 DIAGNOSIS — D490 Neoplasm of unspecified behavior of digestive system: Secondary | ICD-10-CM

## 2017-03-16 DIAGNOSIS — C159 Malignant neoplasm of esophagus, unspecified: Secondary | ICD-10-CM | POA: Diagnosis not present

## 2017-03-16 DIAGNOSIS — Z79899 Other long term (current) drug therapy: Secondary | ICD-10-CM | POA: Insufficient documentation

## 2017-03-16 DIAGNOSIS — Z7982 Long term (current) use of aspirin: Secondary | ICD-10-CM | POA: Insufficient documentation

## 2017-03-16 DIAGNOSIS — Z87891 Personal history of nicotine dependence: Secondary | ICD-10-CM | POA: Insufficient documentation

## 2017-03-16 DIAGNOSIS — K449 Diaphragmatic hernia without obstruction or gangrene: Secondary | ICD-10-CM | POA: Diagnosis not present

## 2017-03-16 DIAGNOSIS — K228 Other specified diseases of esophagus: Secondary | ICD-10-CM | POA: Diagnosis not present

## 2017-03-16 DIAGNOSIS — I10 Essential (primary) hypertension: Secondary | ICD-10-CM | POA: Diagnosis not present

## 2017-03-16 DIAGNOSIS — C155 Malignant neoplasm of lower third of esophagus: Secondary | ICD-10-CM | POA: Diagnosis not present

## 2017-03-16 DIAGNOSIS — M1990 Unspecified osteoarthritis, unspecified site: Secondary | ICD-10-CM | POA: Diagnosis not present

## 2017-03-16 DIAGNOSIS — R1310 Dysphagia, unspecified: Secondary | ICD-10-CM

## 2017-03-16 HISTORY — PX: ESOPHAGOGASTRODUODENOSCOPY (EGD) WITH PROPOFOL: SHX5813

## 2017-03-16 SURGERY — ESOPHAGOGASTRODUODENOSCOPY (EGD) WITH PROPOFOL
Anesthesia: General

## 2017-03-16 MED ORDER — PROPOFOL 10 MG/ML IV BOLUS
INTRAVENOUS | Status: AC
  Filled 2017-03-16: qty 20

## 2017-03-16 MED ORDER — GLYCOPYRROLATE 0.2 MG/ML IJ SOLN
INTRAMUSCULAR | Status: DC | PRN
  Administered 2017-03-16: 0.1 mg via INTRAVENOUS

## 2017-03-16 MED ORDER — SODIUM CHLORIDE 0.9 % IV SOLN
INTRAVENOUS | Status: DC
  Administered 2017-03-16: 11:00:00 via INTRAVENOUS

## 2017-03-16 MED ORDER — PROPOFOL 10 MG/ML IV BOLUS
INTRAVENOUS | Status: DC | PRN
  Administered 2017-03-16: 20 mg via INTRAVENOUS
  Administered 2017-03-16: 10 mg via INTRAVENOUS
  Administered 2017-03-16: 20 mg via INTRAVENOUS
  Administered 2017-03-16: 30 mg via INTRAVENOUS
  Administered 2017-03-16: 50 mg via INTRAVENOUS
  Administered 2017-03-16: 20 mg via INTRAVENOUS

## 2017-03-16 MED ORDER — PROPOFOL 500 MG/50ML IV EMUL
INTRAVENOUS | Status: AC
  Filled 2017-03-16: qty 50

## 2017-03-16 NOTE — Anesthesia Preprocedure Evaluation (Signed)
Anesthesia Evaluation  Patient identified by MRN, date of birth, ID band Patient awake    Reviewed: Allergy & Precautions, NPO status , Patient's Chart, lab work & pertinent test results  History of Anesthesia Complications Negative for: history of anesthetic complications  Airway Mallampati: II       Dental   Pulmonary neg pulmonary ROS, former smoker,           Cardiovascular hypertension, Pt. on medications and Pt. on home beta blockers      Neuro/Psych negative neurological ROS     GI/Hepatic negative GI ROS, Neg liver ROS,   Endo/Other  Hypothyroidism   Renal/GU negative Renal ROS     Musculoskeletal   Abdominal   Peds  Hematology negative hematology ROS (+)   Anesthesia Other Findings   Reproductive/Obstetrics                            Anesthesia Physical Anesthesia Plan  ASA: II  Anesthesia Plan: General   Post-op Pain Management:    Induction: Intravenous  Airway Management Planned: Nasal Cannula  Additional Equipment:   Intra-op Plan:   Post-operative Plan:   Informed Consent: I have reviewed the patients History and Physical, chart, labs and discussed the procedure including the risks, benefits and alternatives for the proposed anesthesia with the patient or authorized representative who has indicated his/her understanding and acceptance.     Plan Discussed with:   Anesthesia Plan Comments:         Anesthesia Quick Evaluation

## 2017-03-16 NOTE — Anesthesia Postprocedure Evaluation (Signed)
Anesthesia Post Note  Patient: Brittney Jackson  Procedure(s) Performed: Procedure(s) (LRB): ESOPHAGOGASTRODUODENOSCOPY (EGD) WITH PROPOFOL (N/A)  Patient location during evaluation: Endoscopy Anesthesia Type: General Level of consciousness: awake and alert Pain management: pain level controlled Vital Signs Assessment: post-procedure vital signs reviewed and stable Respiratory status: spontaneous breathing and respiratory function stable Cardiovascular status: stable Anesthetic complications: no     Last Vitals:  Vitals:   03/16/17 1200 03/16/17 1201  BP:  130/75  Pulse: 94 92  Resp: 17 20  Temp:      Last Pain:  Vitals:   03/16/17 1150  TempSrc: Tympanic  PainSc: 0-No pain                 Lysander Calixte K

## 2017-03-16 NOTE — H&P (Signed)
Brittney Lame, MD [address] [place], [place] [address] Phone:[phone] Fax : [phone]  Primary Care Physician:  Brittney Mc, MD Primary Gastroenterologist:  Dr. Allen Jackson  Pre-Procedure History & Physical: HPI:  Brittney Jackson is a 83 y.o. female is here for an endoscopy.   Past Medical History:  Diagnosis Date  . Arthritis    left knee  . Hypertension   . Hypothyroidism   . Inappropriate ADH secretion (HCC)   . Osteoporosis     Past Surgical History:  Procedure Laterality Date  . CATARACT EXTRACTION W/PHACO Left 05/29/2015   Procedure: CATARACT EXTRACTION PHACO AND INTRAOCULAR LENS PLACEMENT (IOC);  Surgeon: Brittney Koyanagi, MD;  Location: [place];  Service: Ophthalmology;  Laterality: Left;  . CATARACT EXTRACTION W/PHACO Right 01/15/2016   Procedure: CATARACT EXTRACTION PHACO AND INTRAOCULAR LENS PLACEMENT (IOC);  Surgeon: Brittney Koyanagi, MD;  Location: [place];  Service: Ophthalmology;  Laterality: Right;  [place]  . TONSILLECTOMY      Prior to Admission medications   Medication Sig Start Date End Date Taking? Authorizing Provider  cholecalciferol (VITAMIN D) 1000 UNITS tablet Take 1,000 Units by mouth daily.   Yes Historical Provider, MD  labetalol (NORMODYNE) 300 MG tablet Take 0.5 tablets (150 mg total) by mouth 3 (three) times daily. 1/2 tab (150 mg) Am and PM Patient taking differently: Take 150 mg by mouth 2 (two) times daily. 1/2 tab (150 mg) Am and PM 02/28/16  Yes Brittney Mc, MD  levothyroxine (SYNTHROID, LEVOTHROID) 88 MCG tablet TAKE ONE (1) TABLET EACH DAY 08/21/16  Yes Brittney Mc, MD  losartan (COZAAR) 100 MG tablet TAKE ONE (1) TABLET BY MOUTH EVERY DAY 12/23/16  Yes Brittney Mc, MD  ondansetron (ZOFRAN ODT) 4 MG disintegrating tablet Allow 1-2 tablets to dissolve in your mouth every 8 hours as needed for nausea/vomiting 03/14/17  Yes Hinda Kehr, MD  simvastatin (ZOCOR) 40 MG tablet TAKE ONE  (1) TABLET AT BEDTIME 09/14/16  Yes Brittney Mc, MD  aspirin 81 MG tablet Take 81 mg by mouth daily.    Historical Provider, MD  hyoscyamine (LEVSIN SL) 0.125 MG SL tablet Place 1 tablet (0.125 mg total) under the tongue every 4 (four) hours as needed. 03/03/17   Brittney Mc, MD  Multiple Vitamins-Minerals (PRESERVISION AREDS PO) Take by mouth daily.    Historical Provider, MD    Allergies as of 03/15/2017 - Review Complete 03/14/2017  Allergen Reaction Noted  . Amlodipine Swelling 01/09/2014    Family History  Problem Relation Age of Onset  . Heart attack Mother   . Cancer Neg Hx   . COPD Neg Hx     no breast, ovarian or colon    Social History   Social History  . Marital status: Widowed    Spouse name: N/A  . Number of children: N/A  . Years of education: N/A   Occupational History  . 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 Smoking status: Former Smoker    Types: Cigarettes    Quit date: 07/21/2000  . Smokeless tobacco: Never Used  . Alcohol use No  . Drug use: No  . Sexual activity: No   Other Topics Concern  . Not on file   Social History Narrative   Church activities, community service.   Lives alone.    Review of Systems: See HPI, otherwise negative ROS  Physical Exam: BP Marland Kitchen)  151/66   Pulse 79   Temp 97.4 F (36.3 C) (Tympanic)   Resp 18   Ht 5' (1.524 m)   Wt 132 lb (59.9 kg)   SpO2 96%   BMI 25.78 kg/m  General:   Alert,  pleasant and cooperative in NAD Head:  Normocephalic and atraumatic. Neck:  Supple; no masses or thyromegaly. Lungs:  Clear throughout to auscultation.    Heart:  Regular rate and rhythm. Abdomen:  Soft, nontender and nondistended. Normal bowel sounds, without guarding, and without rebound.   Neurologic:  Alert and  oriented x4;  grossly normal neurologically.  Impression/Plan: Brittney Jackson is here for an endoscopy to be performed for dysphagia  Risks, benefits,  limitations, and alternatives regarding  endoscopy have been reviewed with the patient.  Questions have been answered.  All parties agreeable.   Brittney Lame, MD  03/16/2017, 11:21 AM

## 2017-03-16 NOTE — Transfer of Care (Signed)
Immediate Anesthesia Transfer of Care Note  Patient: Brittney Jackson  Procedure(s) Performed: Procedure(s): ESOPHAGOGASTRODUODENOSCOPY (EGD) WITH PROPOFOL (N/A)  Patient Location: PACU  Anesthesia Type:General  Level of Consciousness: awake, alert  and oriented  Airway & Oxygen Therapy: Patient Spontanous Breathing and Patient connected to nasal cannula oxygen  Post-op Assessment: Report given to RN and Post -op Vital signs reviewed and stable  Post vital signs: Reviewed and stable  Last Vitals:  Vitals:   03/16/17 1047 03/16/17 1150  BP: (!) 151/66 (!) 108/92  Pulse: 79 81  Resp: 18 14  Temp: 36.3 C 36.4 C    Last Pain:  Vitals:   03/16/17 1150  TempSrc: Tympanic  PainSc: 0-No pain         Complications: No apparent anesthesia complications

## 2017-03-16 NOTE — Anesthesia Post-op Follow-up Note (Cosign Needed)
Anesthesia QCDR form completed.        

## 2017-03-16 NOTE — Op Note (Signed)
[facility] Gastroenterology Patient Name: Brittney Jackson Procedure Date: 03/16/2017 11:29 AM MRN: [id number] Account #: [id number] Date of Birth: 1930-09-05 Admit Type: Outpatient Age: 83 Room: [facility] ENDO ROOM 4 Gender: Female Note Status: Finalized Procedure:            Upper GI endoscopy Indications:          Dysphagia Providers:            [name] MD, MD Referring MD:         Deborra Medina, MD (Referring MD) Medicines:            Propofol per Anesthesia Complications:        No immediate complications. Procedure:            Pre-Anesthesia Assessment:                       - Prior to the procedure, a History and Physical was                        performed, and patient medications and allergies were                        reviewed. The patient's tolerance of previous                        anesthesia was also reviewed. The risks and benefits of                        the procedure and the sedation options and risks were                        discussed with the patient. All questions were                        answered, and informed consent was obtained. Prior                        Anticoagulants: The patient has taken no previous                        anticoagulant or antiplatelet agents. ASA Grade                        Assessment: II - A patient with mild systemic disease.                        After reviewing the risks and benefits, the patient was                        deemed in satisfactory condition to undergo the                        procedure.                       After obtaining informed consent, the endoscope was                        passed under direct vision. Throughout the procedure,  the patient's blood pressure, pulse, and oxygen                        saturations were monitored continuously. The Endoscope                        was introduced through the mouth, and advanced to the   second part of duodenum. The upper GI endoscopy was                        accomplished without difficulty. The patient tolerated                        the procedure well. Findings:      A large, fungating mass with no bleeding and no stigmata of recent       bleeding was found in the lower third of the esophagus, 30 to 35 cm from       the incisors. The mass was partially obstructing and circumferential.       Biopsies were taken with a cold forceps for histology.      A small hiatal hernia was present.      The examined duodenum was normal. 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                        Biopsied. A pediatric scope was used to pass the mass.                       - Small hiatal hernia.                       - Normal examined duodenum. Recommendation:       - Discharge patient to home.                       - Resume previous diet.                       - Continue present medications.                       - Await pathology results.                       - Refer to an oncologist. Procedure Code(s):    --- Professional ---                       [id number], Esophagogastroduodenoscopy, flexible, transoral;                        with biopsy, single or multiple Diagnosis Code(s):    --- Professional ---                       R13.10, Dysphagia, unspecified                       D49.0, Neoplasm of unspecified behavior of digestive  system CPT copyright 2016 American Medical Association. All rights reserved. The codes documented in this report are preliminary and upon coder review may  be revised to meet current compliance requirements. [name] MD, MD 03/16/2017 11:46:25 AM This report has been signed electronically. Number of Addenda: 0 Note Initiated On: 03/16/2017 11:29 AM      [facility]

## 2017-03-17 ENCOUNTER — Other Ambulatory Visit: Payer: Self-pay | Admitting: Anatomic Pathology & Clinical Pathology

## 2017-03-17 ENCOUNTER — Encounter: Payer: Self-pay | Admitting: Gastroenterology

## 2017-03-17 ENCOUNTER — Other Ambulatory Visit: Payer: Self-pay

## 2017-03-17 ENCOUNTER — Telehealth: Payer: Self-pay | Admitting: Internal Medicine

## 2017-03-17 DIAGNOSIS — D490 Neoplasm of unspecified behavior of digestive system: Secondary | ICD-10-CM

## 2017-03-18 ENCOUNTER — Telehealth: Payer: Self-pay | Admitting: *Deleted

## 2017-03-18 ENCOUNTER — Telehealth: Payer: Self-pay

## 2017-03-18 NOTE — Telephone Encounter (Signed)
857 am - received call from Daughter- [name] at [phone]. Daughter contacted cancer center-spoke with scheduling team to request an apt asap with radiation oncology. Also daughter voiced concerns that apt was 'supposed to be scheduled with Dr. Mike Gip not Dr. Rogue Bussing.' daughter requesting call from Dr. Aletha Halim nurse.  msg received from [place] at Dr. Dorothey Baseman office to contact her to discuss patient's care.  Call returned. Discussed coordinating pt's care with radiation oncology/medical oncology. Explained that pt was provided with an apt with Dr. Tish Men as this was the soonest availability on oncology schedule (03/19/17). Radiation oncology apt could be coordinated on Tuesday with Dr. Massie Maroon (this would be the soonest date for an apt). Ginger asked that our office contact the pt's daughter-Lynn Luther Parody to discuss apts with daughter.  12noon- returned phone call to daughter- [name]. Rn spent 25 mins discussing daughter's concerns. Daughter stated, that she "is very concerned about the occluded tumor in mom's esophagus. Dr. Allen Norris personally spoke with Dr. Mike Gip about my mom's care after the EGD. It was under my impression that radiation was to be started asap as my mom would most likely respond to radiation therapy. I am concerned about my mom not getting radiation therapy asap as I am hoping to prevent my mom from having a feeding tube. I feel like I am not getting an appointment as soon as possible. This is an aggressive tumor. The doctor was not even able to get the scope down my mom's esophagus. She was that closed up. A few weeks ago, my mom could not keep anything down. I am nervous that if mom is not seen and radiation is not started as soon as possible that she will end up in the hospital and require IV fluids and a feeding tube."  Active listening and reassurance provided to daughter. Explained that Most likely Dr. Massie Maroon could not consult until Tuesday as this is the next  available apt with the radiation oncologist. I highly encouraged pt's daughter to have her mother keep the schedule apt with Dr. Rogue Bussing. Daughter stated that she "doesn't care which oncologist sees my mom, I just want something done sooner with radiation."  Daughter states that she would like to avoid her mother getting a feeding tube. I personally discussed with daughter that patient may still require a feeding tube at some point in her care. I explained that I would advocate for a sooner apt in radiation oncology if at all possible.  I explained to the daughter would receive a phone call later this afternoon to provide the apt time with Dr. Massie Maroon.  Hand off of conversation provided to nurse navigation team-Kristi Delia Chimes, RN will personally contact daughter to f/u on concerns and appointments.

## 2017-03-18 NOTE — Telephone Encounter (Addendum)
  Oncology Nurse Navigator Documentation Initial phone call made to daughter Jeani Hawking in regards to appointment with Dr. Baruch Gouty. Appointment is 3/27 at 1:30p.She proceeded to tell me that she is very unhappy with the fact that radiation cannot start now. She had endoscopy on 3/20 and is scheduled to see medical oncology 3/23. Unfortunately Dr. Baruch Gouty is out of the office and cannot see her until 3/27, I did educate her further on radiation oncology, simulation, and getting started on radiation. She is upset and does not want her mother to have a feeding tube due to dysphagia. She stated she will keep appointments for now and will see if Dr. Dorothey Baseman can get her in with someone who has more than one Radiation Oncologist. Notified Ginger at Dr. Dorothey Baseman office that Jeani Hawking may call and request her care be transferred to another facility. Navigator Location: [facility] (03/18/17 1400) Referral date to RadOnc/MedOnc: 03/17/17 (03/18/17 1400) )Navigator Encounter Type: Introductory phone call;Telephone (03/18/17 1400) Telephone: Lahoma Crocker Call;Appt Confirmation/Clarification;Education (03/18/17 1400) Abnormal Finding Date: 03/16/17 (03/18/17 1400) Confirmed Diagnosis Date: 03/17/17 (03/18/17 1400)                   Barriers/Navigation Needs: Education;Coordination of Care (03/18/17 1400) Education: Understanding Cancer/ Treatment Options (03/18/17 1400) Interventions: Coordination of Care;Education (03/18/17 1400)   Coordination of Care: Appts (03/18/17 1400) Education Method: Verbal (03/18/17 1400)      Acuity: Level 3 (03/18/17 1400)   Acuity Level 2: Initial guidance, education and coordination as needed;Educational needs;Assistance expediting appointments;Ongoing guidance and education throughout treatment as needed (03/18/17 1400) Acuity Level 3: Coordination of multimodality treatment;Ongoing guidance and education provided throughout treatment (03/18/17 1400)   Time Spent with Patient: 15  (03/18/17 1400)

## 2017-03-19 ENCOUNTER — Encounter: Payer: Self-pay | Admitting: Internal Medicine

## 2017-03-19 ENCOUNTER — Inpatient Hospital Stay: Payer: Medicare Other | Attending: Internal Medicine | Admitting: Internal Medicine

## 2017-03-19 DIAGNOSIS — E222 Syndrome of inappropriate secretion of antidiuretic hormone: Secondary | ICD-10-CM | POA: Diagnosis not present

## 2017-03-19 DIAGNOSIS — Z79899 Other long term (current) drug therapy: Secondary | ICD-10-CM | POA: Diagnosis not present

## 2017-03-19 DIAGNOSIS — C155 Malignant neoplasm of lower third of esophagus: Secondary | ICD-10-CM

## 2017-03-19 DIAGNOSIS — M818 Other osteoporosis without current pathological fracture: Secondary | ICD-10-CM | POA: Diagnosis not present

## 2017-03-19 DIAGNOSIS — Z8051 Family history of malignant neoplasm of kidney: Secondary | ICD-10-CM | POA: Diagnosis not present

## 2017-03-19 DIAGNOSIS — I700 Atherosclerosis of aorta: Secondary | ICD-10-CM | POA: Diagnosis not present

## 2017-03-19 DIAGNOSIS — K760 Fatty (change of) liver, not elsewhere classified: Secondary | ICD-10-CM | POA: Insufficient documentation

## 2017-03-19 DIAGNOSIS — E039 Hypothyroidism, unspecified: Secondary | ICD-10-CM

## 2017-03-19 DIAGNOSIS — Z87891 Personal history of nicotine dependence: Secondary | ICD-10-CM | POA: Diagnosis not present

## 2017-03-19 DIAGNOSIS — Z7982 Long term (current) use of aspirin: Secondary | ICD-10-CM | POA: Diagnosis not present

## 2017-03-19 DIAGNOSIS — N2889 Other specified disorders of kidney and ureter: Secondary | ICD-10-CM | POA: Diagnosis not present

## 2017-03-19 DIAGNOSIS — E871 Hypo-osmolality and hyponatremia: Secondary | ICD-10-CM | POA: Diagnosis not present

## 2017-03-19 DIAGNOSIS — M129 Arthropathy, unspecified: Secondary | ICD-10-CM | POA: Diagnosis not present

## 2017-03-19 DIAGNOSIS — K449 Diaphragmatic hernia without obstruction or gangrene: Secondary | ICD-10-CM

## 2017-03-19 DIAGNOSIS — R634 Abnormal weight loss: Secondary | ICD-10-CM | POA: Insufficient documentation

## 2017-03-19 DIAGNOSIS — I10 Essential (primary) hypertension: Secondary | ICD-10-CM | POA: Insufficient documentation

## 2017-03-19 DIAGNOSIS — R0789 Other chest pain: Secondary | ICD-10-CM | POA: Diagnosis not present

## 2017-03-19 NOTE — Patient Instructions (Signed)

## 2017-03-19 NOTE — Progress Notes (Signed)
[facility] NOTE  Patient Care Team: Crecencio Mc, MD as PCP - General (Internal Medicine)  CHIEF COMPLAINTS/PURPOSE OF CONSULTATION: Esophageal cancer  #  Oncology History   # MARCH 2018- [place] CA; mod diff [lower third of esophagus; EGD Bx; Dr.Wohl]; CT- A/P- no distant mets/LN.  # March 2018- LEFT KIDNEY COMPLEX MASS ~2cm [incidental]   # MOLECULAR TESTING- Her 2 neu/PDL-1 ordered.      Malignant neoplasm of lower third of esophagus (HCC)     HISTORY OF PRESENTING ILLNESS:  Brittney Jackson 83 y.o.  female with no significant past medical history who is very active for age/independent noted to have difficulty swallowing for the last few months- both to solids and liquids. This was progressive; gotten worse in the last few weeks. She lost about 10-12 pounds. She felt the chest pressure; which would improve after "burping".   Otherwise denies any pain. Denies any blood in stools or black stools. Denies any tingling or numbness. Denies any significant history of reflux  Patient was seen in the emergency room recently had a CT of the abdomen and pelvis that showed- thickening around the lower esophagus; which led to evaluation by gastroenterology endoscopy that showed fungating circumferential partially obstructing lower esophageal mass. This was subsequently biopsied. Patient presents to discuss treatment options. Incidentally she was also noted to have approximately 2 cm left kidney mass.  ROS: A complete 10 point review of system is done which is negative except mentioned above in history of present illness  MEDICAL HISTORY:  Past Medical History:  Diagnosis Date  . Arthritis    left knee  . Function kidney decreased   . Hypertension   . Hypothyroidism   . Inappropriate ADH secretion (HCC)   . Low sodium levels   . Osteoporosis     SURGICAL HISTORY: Past Surgical History:  Procedure Laterality Date  . CATARACT EXTRACTION W/PHACO Left 05/29/2015    Procedure: CATARACT EXTRACTION PHACO AND INTRAOCULAR LENS PLACEMENT (IOC);  Surgeon: Leandrew Koyanagi, MD;  Location: [facility];  Service: Ophthalmology;  Laterality: Left;  . CATARACT EXTRACTION W/PHACO Right 01/15/2016   Procedure: CATARACT EXTRACTION PHACO AND INTRAOCULAR LENS PLACEMENT (IOC);  Surgeon: Leandrew Koyanagi, MD;  Location: [facility];  Service: Ophthalmology;  Laterality: Right;  SHUGARCAINE  . ESOPHAGOGASTRODUODENOSCOPY (EGD) WITH PROPOFOL N/A 03/16/2017   Procedure: ESOPHAGOGASTRODUODENOSCOPY (EGD) WITH PROPOFOL;  Surgeon: [name], MD;  Location: [facility];  Service: Endoscopy;  Laterality: N/A;  . 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 Family is in the area.  Social History   Social History  . Marital status: Widowed    Spouse name: N/A  . Number of children: N/A  . Years of education: N/A   Occupational History  . 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 Smoking status: Former Smoker    Types: Cigarettes    Quit date: 07/21/2000  . Smokeless tobacco: Never Used  . Alcohol use No  . Drug use: No  . Sexual activity: No   Other Topics Concern  . Not on file   Social History Narrative   Church activities, community service.   Lives alone.    FAMILY HISTORY: Family History  Problem Relation Age of Onset  . Heart attack Mother   . Cancer Maternal Uncle     renal cancer   . COPD Neg Hx  no breast, ovarian or colon    ALLERGIES:  is allergic to amlodipine.  MEDICATIONS:  Current Outpatient Prescriptions  Medication Sig Dispense Refill  . labetalol (NORMODYNE) 300 MG tablet Take 0.5 tablets (150 mg total) by mouth 3 (three) times daily. 1/2 tab (150 mg) Am and PM (Patient taking differently: Take 150 mg by mouth 2 (two) times daily. 1/2 tab (150 mg) Am and PM) 270 tablet 1  . levothyroxine (SYNTHROID, LEVOTHROID)  88 MCG tablet TAKE ONE (1) TABLET EACH DAY 90 tablet 2  . losartan (COZAAR) 100 MG tablet TAKE ONE (1) TABLET BY MOUTH EVERY DAY 90 tablet 1  . Multiple Vitamins-Minerals (PRESERVISION AREDS PO) Take by mouth daily.    . aspirin 81 MG tablet Take 81 mg by mouth daily.    . cholecalciferol (VITAMIN D) 1000 UNITS tablet Take 1,000 Units by mouth daily.    . hyoscyamine (LEVSIN SL) 0.125 MG SL tablet Place 1 tablet (0.125 mg total) under the tongue every 4 (four) hours as needed. (Patient not taking: Reported on 03/19/2017) 30 tablet 0  . ondansetron (ZOFRAN ODT) 4 MG disintegrating tablet Allow 1-2 tablets to dissolve in your mouth every 8 hours as needed for nausea/vomiting (Patient not taking: Reported on 03/19/2017) 30 tablet 0  . simvastatin (ZOCOR) 40 MG tablet TAKE ONE (1) TABLET AT BEDTIME (Patient not taking: Reported on 03/19/2017) 90 tablet 1   No current facility-administered medications for this visit.       .  PHYSICAL EXAMINATION: ECOG PERFORMANCE STATUS: 0 - Asymptomatic  Vitals:   03/19/17 1011  BP: (!) 168/68  Pulse: 73  Resp: 16  Temp: 97.4 F (36.3 C)   Filed Weights   03/19/17 1011  Weight: 130 lb (59 kg)    GENERAL: Well-nourished well-developed; Alert, no distress and comfortable.   Accompanied by daughter.  EYES: no pallor or icterus OROPHARYNX: no thrush or ulceration; good dentition  NECK: supple, no masses felt LYMPH:  no palpable lymphadenopathy in the cervical, axillary or inguinal regions LUNGS: clear to auscultation and  No wheeze or crackles HEART/CVS: regular rate & rhythm and no murmurs; No lower extremity edema ABDOMEN: abdomen soft, non-tender and normal bowel sounds Musculoskeletal:no cyanosis of digits and no clubbing  PSYCH: alert & oriented x 3 with fluent speech NEURO: no focal motor/sensory deficits SKIN:  no rashes or significant lesions  LABORATORY DATA:  I have reviewed the data as listed Lab Results  Component Value Date   WBC  6.8 03/14/2017   HGB 13.3 03/14/2017   HCT 39.0 03/14/2017   MCV 85.2 03/14/2017   PLT 319 03/14/2017    Recent Labs  08/06/16 02/26/17 03/14/17 1752  NA 136* 139 139  K 4.7 4.3 3.8  CL  --   --  106  CO2  --   --  30  GLUCOSE  --   --  119*  BUN 16 17 20  CREATININE 0.8 0.9 0.91  CALCIUM  --   --  9.4  GFRNONAA  --   --  56*  GFRAA  --   --  >60  PROT  --   --  7.3  ALBUMIN  --   --  3.9  AST 22 20 25  ALT 17 15 17  ALKPHOS 159* 166* 144*  BILITOT  --   --  0.8    RADIOGRAPHIC STUDIES: I have personally reviewed the radiological images as listed and agreed with the findings in the report. Ct Abdomen   Pelvis W Contrast  Result Date: 03/14/2017 CLINICAL DATA:  Difficulty swallowing. EXAM: CT ABDOMEN AND PELVIS WITH CONTRAST TECHNIQUE: Multidetector CT imaging of the abdomen and pelvis was performed using the standard protocol following bolus administration of intravenous contrast. CONTRAST:  100mL ISOVUE-300 IOPAMIDOL (ISOVUE-300) INJECTION 61% COMPARISON:  None. FINDINGS: Lower chest: There is a small hiatal hernia. There is a 2.2 cm segment of marked circumferential mucosal thickening causing a stricture of the distal esophagus. The visualized esophagus upstream to this abnormality is dilated and fluid-filled. Hepatobiliary: Mild hepatic steatosis.  Normal gallbladder. Pancreas: Unremarkable. No pancreatic ductal dilatation or surrounding inflammatory changes. Spleen: Normal in size without focal abnormality. Adrenals/Urinary Tract: Adrenal glands are unremarkable. Right kidney is normal. There is no evidence of hydronephrosis or nephrolithiasis bilaterally. There is a complex cystic and solid mass within the lateral cortex of the mid pole of the left kidney measuring 2.0 x 2.1 x 2.1 cm . Bladder is unremarkable. Stomach/Bowel: Stomach is within normal limits. No evidence of bowel wall thickening, distention, or inflammatory changes. Sigmoid colonic diverticulosis. Vascular/Lymphatic:  Advanced aortic atherosclerosis with calcified and noncalcified plaque. No enlarged abdominal or pelvic lymph nodes. Reproductive: Uterus and bilateral adnexa are unremarkable. Other: No abdominal wall hernia or abnormality. No abdominopelvic ascites. Musculoskeletal: No acute or significant osseous findings. IMPRESSION: Short segment of marked circumferential mucosal thickening causing a stricture of the distal esophagus with upstream esophageal dilation. Malignancy cannot be excluded. Further evaluation with upper endoscopy is recommended. Small hiatal hernia. 2.1 complex left renal mass. Malignancy cannot be excluded. MRI of the abdomen may be considered if further evaluation is desired. Hepatic steatosis. Advanced atherosclerotic disease of the aorta with calcified and noncalcified plaque. Electronically Signed   By: Dobrinka  Dimitrova M.D.   On: 03/14/2017 21:56    ASSESSMENT & PLAN:   Malignant neoplasm of lower third of esophagus (HCC) # Moderate differentiated adenocarcinoma of the lower esophagus. CT of the abdomen and pelvis- no evidence of distant metastatic disease. Recommend a PET scan for further evaluation. Also discussed the role of EUS in the staging workup for his esophageal cancer. This would be recommended based upon PET scan.   # Discussed that most likely chance of cure- would be surgery. However given her age/pending staging workup- surgery might not be feasible. Would recommend neoadjuvant/definitive chemoradiation- with carbotaxol on a weekly basis.  Discussed the potential side effects including but not limited to-increasing fatigue, nausea vomiting, diarrhea, hair loss, sores in the mouth, increase risk of infection and also neuropathy.   # Weight loss/difficulty swallowing- recommend dietary consultation. Patient reluctant. Discussed that she might need or not feeding tube.   # Also recommend chemotherapy education.   # Await radiation oncology evaluation;  patient/daughter are extremely upset that they are unable to be evaluated by radiation at the earliest. Patient has appointment with radiation oncology on 27th. There are also given options are being evaluated with radiation oncology at other facility. Kristi Stanton notes navigator spoke to patient/family.  # Discussed the daughter and patient that she will likely be started on treatment in the week of April 2nd.    # Left renal mass ~2cm- monitor for now.   # Patient will follow-up with me week of April 2 labs carbotaxol. Family also interested in palliative care evaluation- referral initiated.  # Thank you Dr. Wohl for allowing me to participate in the care of your patient. Please do not hesitate to contact me with questions or concerns in the interim.  All questions   were answered. The patient knows to call the clinic with any problems, questions or concerns.      R , MD 03/19/2017 5:29 PM    

## 2017-03-19 NOTE — Progress Notes (Signed)
  Oncology Nurse Navigator Documentation Met with Ms. Dangerfield and her daughter, Jeani Hawking, during and after consult with Dr. Rogue Bussing. We talked at length regarding esophageal cancer, treatment planning, chemotherapy, radiation therapy, chemotherapy class, nutrition, feeding tubes and obtaining second opinions. They have been scheduled to meet with the Dietician for further education. They would like to avoid a feeding tube if at all possible. They would like to keep appointment with Dr. Baruch Gouty for 3/27 and not consult at another facility for radiation. PET scan is scheduled as well as chemo class with a tentative start date of 4/5. We went over all upcoming appointments and PET prep.They are very interested in palliative care and would like that referral to help with their informed decision making going forward. Nira Conn has been notified and will  facilitate that order. I have provided them with my contact information for any future questions or concerns.  Navigator Location: [facility] (03/19/17 1500)   )Navigator Encounter Type: Initial MedOnc (03/19/17 1500)                     Patient Visit Type: MedOnc;Initial (03/19/17 1500) Treatment Phase: Pre-Tx/Tx Discussion (03/19/17 1500) Barriers/Navigation Needs: Education (03/19/17 1500) Education: Understanding Cancer/ Treatment Options;Coping with Diagnosis/ Prognosis;Newly Diagnosed Cancer Education;Preparing for Upcoming Surgery/ Treatment (03/19/17 1500) Interventions: Education (03/19/17 1500)     Education Method: Verbal;Teach-back (03/19/17 1500)  Support Groups/Services: [facility] consult/visit (03/19/17 1500)             Time Spent with Patient: 90 (03/19/17 1500)

## 2017-03-19 NOTE — Progress Notes (Signed)
START ON PATHWAY REGIMEN - Gastroesophageal     Administer weekly during RT:     Paclitaxel      Carboplatin   **Always confirm dose/schedule in your pharmacy ordering system**    Patient Characteristics: Esophageal & GE Junction, Adenocarcinoma, Preoperative or Nonsurgical Candidate (Clinical Staging), cT3 or Higher or cN+, Unresectable/Nonsurgical Candidate (Any cT) Histology: Adenocarcinoma Disease Classification: GE Junction Therapeutic Status: Preoperative or Nonsurgical Candidate (Clinical Staging) AJCC Grade: G2 AJCC 8 Stage Grouping: Unknown AJCC T Category: cTX AJCC N Category: cNX AJCC M Category: cM0  Intent of Therapy: Non-Curative / Palliative Intent, Discussed with Patient

## 2017-03-19 NOTE — Progress Notes (Signed)
Patient here today as a new consult for Esophageal

## 2017-03-19 NOTE — Assessment & Plan Note (Addendum)
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 CT of the abdomen and pelvis- no evidence of distant metastatic disease. Recommend a PET scan for further evaluation. Also discussed the role of EUS in the staging workup for his esophageal cancer. This would be recommended based upon PET scan.   # Discussed that most likely chance of cure- would be surgery. However given her age/pending staging workup- surgery might not be feasible. Would recommend neoadjuvant/definitive chemoradiation- with carbotaxol on a weekly basis.  Discussed the potential side effects including but not limited to-increasing fatigue, nausea vomiting, diarrhea, hair loss, sores in the mouth, increase risk of infection and also neuropathy.   # Weight loss/difficulty swallowing- recommend dietary consultation. Patient reluctant. Discussed that she might need or not feeding tube.   # Also recommend chemotherapy education.   # Await radiation oncology evaluation; patient/daughter are extremely upset that they are unable to be evaluated by radiation at the earliest. Patient has appointment with radiation oncology on 27th. There are also given options are being evaluated with radiation oncology at other facility. Mariea Clonts notes navigator spoke to patient/family.  # Discussed the daughter and patient that she will likely be started on treatment in the week of April 2nd.    # Left renal mass ~2cm- monitor for now.   # Patient will follow-up with me week of April 2 labs carbotaxol. Family also interested in palliative care evaluation- referral initiated.  # Thank you Dr. Allen Norris for allowing me to participate in the care of your patient. Please do not hesitate to contact me with questions or concerns in the interim.

## 2017-03-22 ENCOUNTER — Ambulatory Visit: Payer: Medicare Other

## 2017-03-22 ENCOUNTER — Telehealth: Payer: Self-pay

## 2017-03-22 NOTE — Telephone Encounter (Signed)
  Oncology Nurse Navigator Documentation Received call from daughter Jeani Hawking. Answered further questions regarding chemotherapy and plan of care going forward. They are possibly interested in changing providers. They are going to proceed with chemo class, PET and consult with Dr. Baruch Gouty this week and are going to be seeing palliative care. Navigator Location: [facility] (03/22/17 1000)   )Navigator Encounter Type: Telephone;Education (03/22/17 1000) Telephone: Incoming Call (03/22/17 1000)                                                  Time Spent with Patient: 30 (03/22/17 1000)

## 2017-03-23 ENCOUNTER — Inpatient Hospital Stay: Payer: Medicare Other

## 2017-03-23 ENCOUNTER — Ambulatory Visit
Admission: RE | Admit: 2017-03-23 | Discharge: 2017-03-23 | Disposition: A | Discharge status: Home / Self Care | Payer: Medicare Other | Source: Ambulatory Visit | Attending: Radiation Oncology | Admitting: Radiation Oncology

## 2017-03-23 ENCOUNTER — Encounter: Payer: Self-pay | Admitting: Radiation Oncology

## 2017-03-23 VITALS — BP 155/71 | HR 83 | Temp 97.7°F | Resp 20 | Wt 129.9 lb

## 2017-03-23 DIAGNOSIS — M818 Other osteoporosis without current pathological fracture: Secondary | ICD-10-CM | POA: Diagnosis not present

## 2017-03-23 DIAGNOSIS — I10 Essential (primary) hypertension: Secondary | ICD-10-CM | POA: Insufficient documentation

## 2017-03-23 DIAGNOSIS — C155 Malignant neoplasm of lower third of esophagus: Secondary | ICD-10-CM | POA: Insufficient documentation

## 2017-03-23 DIAGNOSIS — E222 Syndrome of inappropriate secretion of antidiuretic hormone: Secondary | ICD-10-CM | POA: Diagnosis not present

## 2017-03-23 DIAGNOSIS — Z7982 Long term (current) use of aspirin: Secondary | ICD-10-CM | POA: Diagnosis not present

## 2017-03-23 DIAGNOSIS — Z87891 Personal history of nicotine dependence: Secondary | ICD-10-CM | POA: Insufficient documentation

## 2017-03-23 DIAGNOSIS — Z809 Family history of malignant neoplasm, unspecified: Secondary | ICD-10-CM | POA: Insufficient documentation

## 2017-03-23 DIAGNOSIS — Z79899 Other long term (current) drug therapy: Secondary | ICD-10-CM | POA: Diagnosis not present

## 2017-03-23 DIAGNOSIS — M129 Arthropathy, unspecified: Secondary | ICD-10-CM | POA: Insufficient documentation

## 2017-03-23 DIAGNOSIS — Z510 Encounter for antineoplastic radiation therapy: Secondary | ICD-10-CM | POA: Diagnosis not present

## 2017-03-23 DIAGNOSIS — N2889 Other specified disorders of kidney and ureter: Secondary | ICD-10-CM | POA: Insufficient documentation

## 2017-03-23 DIAGNOSIS — E039 Hypothyroidism, unspecified: Secondary | ICD-10-CM | POA: Diagnosis not present

## 2017-03-23 NOTE — Consult Note (Signed)
NEW PATIENT EVALUATION  Name: Brittney Jackson  MRN: [id number]  Date:   03/23/2017     DOB: 13-Jan-1930   This 83 y.o. female patient presents to the clinic for initial evaluation of distal esophageal adenocarcinoma.  REFERRING PHYSICIAN: Crecencio Mc, MD  CHIEF COMPLAINT:  Chief Complaint  Patient presents with  . Esophageal Cancer    Pt is here for initial consultation of esophageal cancer.      DIAGNOSIS: The encounter diagnosis was Malignant neoplasm of lower third of esophagus ([place]).   PREVIOUS INVESTIGATIONS:  CT scan reviewed Pathology report reviewed Clinical notes and procedure notes reviewed  HPI: Patient is a 83 year old female who presented with a several month history of increasing dysphagia. She was eventually seen by GI at which time an upper endoscopy was performed showing a large fungating mass in the lower third of the esophagus 30-35 cm from the incisors partially obstructing and circumferential. Biopsy was positive for moderately differentiated adenocarcinoma. CT scan confirmed short segment circumferential mucosal thickening in the distal esophagus with upstream dilatation. Patient is scheduled for PET CT scan tomorrow. She's been seen by medical oncology who is planning possible chemotherapy. She is now referred to radiation oncology for opinion. She is doing fairly well. She is mostly eating soft foods. She has lost about 15 pounds.  PLANNED TREATMENT REGIMEN: Concurrent chemoradiation  PAST MEDICAL HISTORY:  has a past medical history of Arthritis; Function kidney decreased; Hypertension; Hypothyroidism; Inappropriate ADH secretion ([place]); Low sodium levels; and Osteoporosis.    PAST SURGICAL HISTORY:  Past Surgical History:  Procedure Laterality Date  . CATARACT EXTRACTION W/PHACO Left 05/29/2015   Procedure: CATARACT EXTRACTION PHACO AND INTRAOCULAR LENS PLACEMENT (IOC);  Surgeon: Leandrew Koyanagi, MD;  Location: [facility];  Service:  Ophthalmology;  Laterality: Left;  . CATARACT EXTRACTION W/PHACO Right 01/15/2016   Procedure: CATARACT EXTRACTION PHACO AND INTRAOCULAR LENS PLACEMENT (IOC);  Surgeon: Leandrew Koyanagi, MD;  Location: [facility];  Service: Ophthalmology;  Laterality: Right;  SHUGARCAINE  . ESOPHAGOGASTRODUODENOSCOPY (EGD) WITH PROPOFOL N/A 03/16/2017   Procedure: ESOPHAGOGASTRODUODENOSCOPY (EGD) WITH PROPOFOL;  Surgeon: [name], MD;  Location: [facility];  Service: Endoscopy;  Laterality: N/A;  . TONSILLECTOMY      FAMILY HISTORY: family history includes Cancer in her maternal uncle; Heart attack in her mother.  SOCIAL HISTORY:  reports that she quit smoking about 16 years ago. Her smoking use included Cigarettes. She has never used smokeless tobacco. She reports that she does not drink alcohol or use drugs.  ALLERGIES: Amlodipine  MEDICATIONS:  Current Outpatient Prescriptions  Medication Sig Dispense Refill  . labetalol (NORMODYNE) 300 MG tablet Take 0.5 tablets (150 mg total) by mouth 3 (three) times daily. 1/2 tab (150 mg) Am and PM (Patient taking differently: Take 150 mg by mouth 2 (two) times daily. 1/2 tab (150 mg) Am and PM) 270 tablet 1  . levothyroxine (SYNTHROID, LEVOTHROID) 88 MCG tablet TAKE ONE (1) TABLET EACH DAY 90 tablet 2  . losartan (COZAAR) 100 MG tablet TAKE ONE (1) TABLET BY MOUTH EVERY DAY 90 tablet 1  . aspirin 81 MG tablet Take 81 mg by mouth daily.    . cholecalciferol (VITAMIN D) 1000 UNITS tablet Take 1,000 Units by mouth daily.    . hyoscyamine (LEVSIN SL) 0.125 MG SL tablet Place 1 tablet (0.125 mg total) under the tongue every 4 (four) hours as needed. (Patient not taking: Reported on 03/19/2017) 30 tablet 0  . Multiple Vitamins-Minerals (PRESERVISION AREDS PO) Take  by mouth daily.    . ondansetron (ZOFRAN ODT) 4 MG disintegrating tablet Allow 1-2 tablets to dissolve in your mouth every 8 hours as needed for nausea/vomiting (Patient not taking: Reported on  03/19/2017) 30 tablet 0  . simvastatin (ZOCOR) 40 MG tablet TAKE ONE (1) TABLET AT BEDTIME (Patient not taking: Reported on 03/19/2017) 90 tablet 1   No current facility-administered medications for this encounter.     ECOG PERFORMANCE STATUS:  1 - Symptomatic but completely ambulatory  REVIEW OF SYSTEMS: Except for the mild dysphagia Patient denies any weight loss, fatigue, weakness, fever, chills or night sweats. Patient denies any loss of vision, blurred vision. Patient denies any ringing  of the ears or hearing loss. No irregular heartbeat. Patient denies heart murmur or history of fainting. Patient denies any chest pain or pain radiating to her upper extremities. Patient denies any shortness of breath, difficulty breathing at night, cough or hemoptysis. Patient denies any swelling in the lower legs. Patient denies any nausea vomiting, vomiting of blood, or coffee ground material in the vomitus. Patient denies any stomach pain. Patient states has had normal bowel movements no significant constipation or diarrhea. Patient denies any dysuria, hematuria or significant nocturia. Patient denies any problems walking, swelling in the joints or loss of balance. Patient denies any skin changes, loss of hair or loss of weight. Patient denies any excessive worrying or anxiety or significant depression. Patient denies any problems with insomnia. Patient denies excessive thirst, polyuria, polydipsia. Patient denies any swollen glands, patient denies easy bruising or easy bleeding. Patient denies any recent infections, allergies or URI. Patient "s visual fields have not changed significantly in recent time.    PHYSICAL EXAM: BP (!) 155/71   Pulse 83   Temp 97.7 F (36.5 C)   Resp 20   Wt 129 lb 13.6 oz (58.9 kg)   BMI 25.36 kg/m  Well-developed well-nourished patient in NAD. HEENT reveals PERLA, EOMI, discs not visualized.  Oral cavity is clear. No oral mucosal lesions are identified. Neck is clear without  evidence of cervical or supraclavicular adenopathy. Lungs are clear to A&P. Cardiac examination is essentially unremarkable with regular rate and rhythm without murmur rub or thrill. Abdomen is benign with no organomegaly or masses noted. Motor sensory and DTR levels are equal and symmetric in the upper and lower extremities. Cranial nerves II through XII are grossly intact. Proprioception is intact. No peripheral adenopathy or edema is identified. No motor or sensory levels are noted. Crude visual fields are within normal range.  LABORATORY DATA: Pathology report reviewed    RADIOLOGY RESULTS: CT scan reviewed PET CT scan will be reviewed for treatment planning   IMPRESSION: Adenocarcinoma the distal esophagus in 83 year old female  PLAN: At this time I would react concurrent chemoradiation therapy if patient can tolerate chemotherapy. I will plan on delivering 5400 cGy to her distal esophagus using her PET CT scan for tumor delineation. I would plan on using IM RT treatment planning and delivery to spare critical structures such as her heart spinal cord normal lung volume. Risks and benefits of treatment including increased dysphasia secondary to radiation esophagitis alteration of blood counts fatigue skin reaction all were discussed in detail.There will be extra effort by both professional staff as well as technical staff to coordinate and manage concurrent chemoradiation and ensuing side effects during her treatments. I personally ordered and scheduled CT simulation for later this week after her PET CT scan has been performed. Patient and family comprehend my  treatment plan well.  I would like to take this opportunity to thank you for allowing me to participate in the care of your patient.Armstead Peaks., MD

## 2017-03-24 ENCOUNTER — Encounter
Admission: RE | Admit: 2017-03-24 | Discharge: 2017-03-24 | Disposition: A | Discharge status: Home / Self Care | Payer: Medicare Other | Source: Ambulatory Visit | Attending: Internal Medicine | Admitting: Internal Medicine

## 2017-03-24 DIAGNOSIS — C155 Malignant neoplasm of lower third of esophagus: Secondary | ICD-10-CM | POA: Insufficient documentation

## 2017-03-24 DIAGNOSIS — C159 Malignant neoplasm of esophagus, unspecified: Secondary | ICD-10-CM | POA: Diagnosis not present

## 2017-03-24 LAB — GLUCOSE, CAPILLARY: Glucose-Capillary: 103 mg/dL — ABNORMAL HIGH (ref 65–99)

## 2017-03-24 MED ORDER — FLUDEOXYGLUCOSE F - 18 (FDG) INJECTION
12.5600 | Freq: Once | INTRAVENOUS | Status: AC | PRN
  Administered 2017-03-24: 12.56 via INTRAVENOUS

## 2017-03-25 ENCOUNTER — Encounter: Payer: Self-pay | Admitting: Internal Medicine

## 2017-03-25 ENCOUNTER — Inpatient Hospital Stay: Payer: Medicare Other

## 2017-03-25 ENCOUNTER — Ambulatory Visit
Admission: RE | Admit: 2017-03-25 | Discharge: 2017-03-25 | Disposition: A | Discharge status: Home / Self Care | Payer: Medicare Other | Source: Ambulatory Visit | Attending: Radiation Oncology | Admitting: Radiation Oncology

## 2017-03-25 DIAGNOSIS — E039 Hypothyroidism, unspecified: Secondary | ICD-10-CM | POA: Diagnosis not present

## 2017-03-25 DIAGNOSIS — C155 Malignant neoplasm of lower third of esophagus: Secondary | ICD-10-CM | POA: Diagnosis not present

## 2017-03-25 DIAGNOSIS — M129 Arthropathy, unspecified: Secondary | ICD-10-CM | POA: Diagnosis not present

## 2017-03-25 DIAGNOSIS — Z510 Encounter for antineoplastic radiation therapy: Secondary | ICD-10-CM | POA: Diagnosis not present

## 2017-03-25 DIAGNOSIS — N2889 Other specified disorders of kidney and ureter: Secondary | ICD-10-CM | POA: Diagnosis not present

## 2017-03-25 DIAGNOSIS — Z87891 Personal history of nicotine dependence: Secondary | ICD-10-CM | POA: Diagnosis not present

## 2017-03-25 DIAGNOSIS — I10 Essential (primary) hypertension: Secondary | ICD-10-CM | POA: Diagnosis not present

## 2017-03-25 NOTE — Progress Notes (Signed)
Nutrition Assessment   Reason for Assessment:   MD referral for weight loss  ASSESSMENT:  83 year old female with distal esophageal adenocarcinoma. Past medical history of HTN.    Patient seen in clinic this am with daughter.  Patient reports appetite has been good but consistency and amount of food has been the problem due to esophageal tumor.  Patient reports decreased intake for the last 2 months.  Daughter reports every since she was given a dose of zofran IV nausea and appetite has been better.  Yesterday ate bacon (chewed well), eggs, biscuit and gravy).  Patient reports that she can eat most foods as long as she chews them well.  Reports drinking 1 ensure per day typically (ensure plus, boost plus).    Reports bowels move typically 1 time per day   Nutrition Focused Physical Exam: time ran out, had to get to radiation appointment  Medications: MVI, zofran (not taking), Vit D  Labs: glucose 119  Anthropometrics:   Height: 60 inches Weight: 129 lb 13.6 oz UBW: 143 lb in Jan 2018 BMI: 25  10% weight loss in the last 2 months, significant. Patient reports in Jan had a upper respiratory infection and placed on antibiotics which caused increased nausea and vomiting and appetite was extremely poor.  Feels this is where most of weight loss came from   Estimated Energy Needs  Kcals: 1700-2000 calories/d Protein: 71-89 g/d Fluid: 2 L/d  NUTRITION DIAGNOSIS: Malnutrition severe related to respiratory infection, nausea vomiting from antibiotics and esophageal cancer as evidenced by 10% weight loss in 2 months and eating < or equal to 50% of energy needs for > or equal to 5 days.   MALNUTRITION DIAGNOSIS: Patient meets criteria for severe malnutrition as evidenced by 10% weight loss in 2 months and eating < or equal to 505 of energy needs for > or equal to 5 days.   INTERVENTION:   Encouraged intake of 350 calorie oral nutrition supplement. Samples and coupons provided Patient  well aware of foods high in calories and protein and smooth, moist soft protein foods. Encouraged patient to continue to consume these foods. Discussed possible need to chop/grind/puree foods as an option if needed. Patient wants to avoid a feeding tube.  Recipe book given as well.      MONITORING, EVALUATION, GOAL: Patient will consume high calorie, high protein foods to prevent further weight loss   NEXT VISIT: to be determined  Chanc Kervin B. Zenia Resides, [place], [place] Registered Dietitian [phone] (pager)

## 2017-03-26 ENCOUNTER — Encounter: Payer: Self-pay | Admitting: Internal Medicine

## 2017-03-29 ENCOUNTER — Ambulatory Visit: Payer: Medicare Other | Admitting: Gastroenterology

## 2017-03-29 ENCOUNTER — Encounter: Payer: Self-pay | Admitting: Internal Medicine

## 2017-03-29 ENCOUNTER — Telehealth: Payer: Self-pay

## 2017-03-29 DIAGNOSIS — E039 Hypothyroidism, unspecified: Secondary | ICD-10-CM | POA: Diagnosis not present

## 2017-03-29 DIAGNOSIS — C155 Malignant neoplasm of lower third of esophagus: Secondary | ICD-10-CM | POA: Diagnosis not present

## 2017-03-29 DIAGNOSIS — N2889 Other specified disorders of kidney and ureter: Secondary | ICD-10-CM | POA: Diagnosis not present

## 2017-03-29 DIAGNOSIS — I10 Essential (primary) hypertension: Secondary | ICD-10-CM | POA: Diagnosis not present

## 2017-03-29 DIAGNOSIS — Z510 Encounter for antineoplastic radiation therapy: Secondary | ICD-10-CM | POA: Diagnosis not present

## 2017-03-29 DIAGNOSIS — M129 Arthropathy, unspecified: Secondary | ICD-10-CM | POA: Diagnosis not present

## 2017-03-29 DIAGNOSIS — Z87891 Personal history of nicotine dependence: Secondary | ICD-10-CM | POA: Diagnosis not present

## 2017-03-29 LAB — SURGICAL PATHOLOGY

## 2017-03-29 NOTE — Telephone Encounter (Signed)
  Oncology Nurse Navigator Documentation Received call from daughter Brittney Jackson. She had several questions regarding upcoming appointments. Went over upcoming appointments for 4/5 starting in the lab at 0900. He asked about having a port a cath placed. Instructed that for these medications it is not required. She is scheduled for 6 treatments. This may change depending on infusion nurse assessment. She voiced concern over her renal function and wanted to make sure if needed that Her Nephrologist, Dr. Holley Raring was kept in the loop. She has had hospitalization for hyponatremia before and it was very difficult to get her levels back up. She is worried regarding this because she cannot drink water as she was educated to do to clear these medications and if she receives IVF to compensate, NS may not be enough. Ensured her that all of her labs levels will be under close monitoring and Dr. Rogue Bussing will consult with Dr. Holley Raring as needed. She also is concerned that no one is considering her mother's age and she wants to make sure it is taken into consideration as she stated the first thing that was mentioned was surgery. Ensured her that her age is being considered and was brought up during tumor board as it would likely not make her a surgical candidate. She understands that any treatment she receives will be her decision and she can stop at any time. She also wants to know the stage of her disease. Reviewed PET scan with her. We can get Dr. Rogue Bussing to give her an unofficial stage but without EUS he may not be able to give her an official stage.  Navigator Location: [facility] (03/29/17 1000)   )Navigator Encounter Type: Telephone;Education (03/29/17 1000) Telephone: Incoming Call;Education (03/29/17 1000)                                                  Time Spent with Patient: 30 (03/29/17 1000)

## 2017-04-01 ENCOUNTER — Encounter: Payer: Self-pay | Admitting: Internal Medicine

## 2017-04-01 ENCOUNTER — Inpatient Hospital Stay: Payer: Medicare Other | Attending: Internal Medicine

## 2017-04-01 ENCOUNTER — Inpatient Hospital Stay: Payer: Medicare Other

## 2017-04-01 ENCOUNTER — Inpatient Hospital Stay (HOSPITAL_BASED_OUTPATIENT_CLINIC_OR_DEPARTMENT_OTHER): Payer: Medicare Other | Admitting: Internal Medicine

## 2017-04-01 VITALS — BP 118/74 | HR 67 | Temp 96.3°F | Resp 16 | Wt 127.4 lb

## 2017-04-01 DIAGNOSIS — C155 Malignant neoplasm of lower third of esophagus: Secondary | ICD-10-CM | POA: Diagnosis not present

## 2017-04-01 DIAGNOSIS — R911 Solitary pulmonary nodule: Secondary | ICD-10-CM | POA: Diagnosis not present

## 2017-04-01 DIAGNOSIS — Z87891 Personal history of nicotine dependence: Secondary | ICD-10-CM | POA: Diagnosis not present

## 2017-04-01 DIAGNOSIS — E871 Hypo-osmolality and hyponatremia: Secondary | ICD-10-CM | POA: Diagnosis not present

## 2017-04-01 DIAGNOSIS — M818 Other osteoporosis without current pathological fracture: Secondary | ICD-10-CM

## 2017-04-01 DIAGNOSIS — E222 Syndrome of inappropriate secretion of antidiuretic hormone: Secondary | ICD-10-CM | POA: Diagnosis not present

## 2017-04-01 DIAGNOSIS — R634 Abnormal weight loss: Secondary | ICD-10-CM | POA: Diagnosis not present

## 2017-04-01 DIAGNOSIS — R630 Anorexia: Secondary | ICD-10-CM | POA: Insufficient documentation

## 2017-04-01 DIAGNOSIS — Z8051 Family history of malignant neoplasm of kidney: Secondary | ICD-10-CM

## 2017-04-01 DIAGNOSIS — K760 Fatty (change of) liver, not elsewhere classified: Secondary | ICD-10-CM | POA: Insufficient documentation

## 2017-04-01 DIAGNOSIS — E039 Hypothyroidism, unspecified: Secondary | ICD-10-CM | POA: Insufficient documentation

## 2017-04-01 DIAGNOSIS — N2889 Other specified disorders of kidney and ureter: Secondary | ICD-10-CM

## 2017-04-01 DIAGNOSIS — Z5111 Encounter for antineoplastic chemotherapy: Secondary | ICD-10-CM | POA: Insufficient documentation

## 2017-04-01 DIAGNOSIS — Z7982 Long term (current) use of aspirin: Secondary | ICD-10-CM

## 2017-04-01 DIAGNOSIS — I700 Atherosclerosis of aorta: Secondary | ICD-10-CM

## 2017-04-01 DIAGNOSIS — Z79899 Other long term (current) drug therapy: Secondary | ICD-10-CM | POA: Insufficient documentation

## 2017-04-01 DIAGNOSIS — R1310 Dysphagia, unspecified: Secondary | ICD-10-CM | POA: Insufficient documentation

## 2017-04-01 DIAGNOSIS — K449 Diaphragmatic hernia without obstruction or gangrene: Secondary | ICD-10-CM | POA: Insufficient documentation

## 2017-04-01 DIAGNOSIS — I10 Essential (primary) hypertension: Secondary | ICD-10-CM | POA: Diagnosis not present

## 2017-04-01 LAB — COMPREHENSIVE METABOLIC PANEL
ALT: 22 U/L (ref 14–54)
AST: 25 U/L (ref 15–41)
Albumin: 3.8 g/dL (ref 3.5–5.0)
Alkaline Phosphatase: 130 U/L — ABNORMAL HIGH (ref 38–126)
Anion gap: 5 (ref 5–15)
BUN: 24 mg/dL — AB (ref 6–20)
CHLORIDE: 104 mmol/L (ref 101–111)
CO2: 28 mmol/L (ref 22–32)
CREATININE: 0.84 mg/dL (ref 0.44–1.00)
Calcium: 9.4 mg/dL (ref 8.9–10.3)
GFR calc Af Amer: >60 mL/min (ref >60)
Glucose, Bld: 115 mg/dL — ABNORMAL HIGH (ref 65–99)
POTASSIUM: 4.2 mmol/L (ref 3.5–5.1)
SODIUM: 137 mmol/L (ref 135–145)
Total Bilirubin: 0.6 mg/dL (ref 0.3–1.2)
Total Protein: 7 g/dL (ref 6.5–8.1)

## 2017-04-01 LAB — CBC WITH DIFFERENTIAL/PLATELET
Basophils Absolute: 0.1 10*3/uL (ref 0–0.1)
Basophils Relative: 2 %
EOS ABS: 0.2 10*3/uL (ref 0–0.7)
EOS PCT: 3 %
HCT: 39 % (ref 35.0–47.0)
Hemoglobin: 13.4 g/dL (ref 12.0–16.0)
LYMPHS ABS: 2.2 10*3/uL (ref 1.0–3.6)
Lymphocytes Relative: 28 %
MCH: 29.2 pg (ref 26.0–34.0)
MCHC: 34.4 g/dL (ref 32.0–36.0)
MCV: 84.9 fL (ref 80.0–100.0)
MONO ABS: 1 10*3/uL — AB (ref 0.2–0.9)
MONOS PCT: 13 %
Neutro Abs: 4.4 10*3/uL (ref 1.4–6.5)
Neutrophils Relative %: 54 %
PLATELETS: 344 10*3/uL (ref 150–440)
RBC: 4.59 MIL/uL (ref 3.80–5.20)
RDW: 14.9 % — AB (ref 11.5–14.5)
WBC: 7.9 10*3/uL (ref 3.6–11.0)

## 2017-04-01 MED ORDER — FAMOTIDINE IN NACL 20-0.9 MG/50ML-% IV SOLN
20.0000 mg | Freq: Once | INTRAVENOUS | Status: AC
  Administered 2017-04-01: 20 mg via INTRAVENOUS
  Filled 2017-04-01: qty 50

## 2017-04-01 MED ORDER — PACLITAXEL CHEMO INJECTION 300 MG/50ML
50.0000 mg/m2 | Freq: Once | INTRAVENOUS | Status: AC
  Administered 2017-04-01: 78 mg via INTRAVENOUS
  Filled 2017-04-01: qty 13

## 2017-04-01 MED ORDER — SODIUM CHLORIDE 0.9 % IV SOLN
128.0000 mg | Freq: Once | INTRAVENOUS | Status: AC
  Administered 2017-04-01: 130 mg via INTRAVENOUS
  Filled 2017-04-01: qty 13

## 2017-04-01 MED ORDER — PALONOSETRON HCL INJECTION 0.25 MG/5ML
0.2500 mg | Freq: Once | INTRAVENOUS | Status: AC
  Administered 2017-04-01: 0.25 mg via INTRAVENOUS
  Filled 2017-04-01: qty 5

## 2017-04-01 MED ORDER — SODIUM CHLORIDE 0.9 % IV SOLN
Freq: Once | INTRAVENOUS | Status: AC
  Administered 2017-04-01: 11:00:00 via INTRAVENOUS
  Filled 2017-04-01: qty 1000

## 2017-04-01 MED ORDER — SODIUM CHLORIDE 0.9 % IV SOLN
20.0000 mg | Freq: Once | INTRAVENOUS | Status: AC
  Administered 2017-04-01: 20 mg via INTRAVENOUS
  Filled 2017-04-01: qty 2

## 2017-04-01 MED ORDER — DIPHENHYDRAMINE HCL 50 MG/ML IJ SOLN
50.0000 mg | Freq: Once | INTRAMUSCULAR | Status: AC
  Administered 2017-04-01: 50 mg via INTRAVENOUS
  Filled 2017-04-01: qty 1

## 2017-04-01 NOTE — Progress Notes (Signed)
Nutrition Follow-up:  Nutrition follow-up completed in infusion. Daughter at chairside.  Daughter reports patient vomited up lunch yesterday (ham and cheese sandwich) but feels it was not the best choice for patient with esophageal mass.  Reports patient was not nauseated at the time.  Patient reports drank ensure enlive this am.  Daughter reports patient lives alone and wants to remain independent as long as possible.  Patient with esophageal adenocarcinoma.   No other nutrition symptoms reported at this time.  Medications: reviewed  Labs: BUN 24, creatinine WDL  Anthropometrics:   Patient weight today is 127 pounds 6 oz decreased from 129 lb and 13.6 oz on 3/29.     NUTRITION DIAGNOSIS: Malnutrition severe continues   MALNUTRITION DIAGNOSIS: Severe malnutrition continues   INTERVENTION:   Discussed importance of choosing foods that are soft, liquid consistency to increase chance of foods passing esophageal mass.  Daughter and patient verbalized understanding. Encouraged soups, casseroles, gravies, sauces, etc on foods.   Recommended Boost Very High Calorie supplement as daughter was asking about oral supplements with more calories than ensure/boost plus.      MONITORING, EVALUATION, GOAL: patient will consume high calorie, high protein foods to prevent weight loss   NEXT VISIT: April 19th after XRT  Maurine Mowbray B. Zenia Resides, [place], [place] Registered Dietitian [phone] (pager)

## 2017-04-01 NOTE — Progress Notes (Signed)
[facility] NOTE  Patient Care Team: Crecencio Mc, MD as PCP - General (Internal Medicine) Clent Jacks, RN as Registered Nurse  CHIEF COMPLAINTS/PURPOSE OF CONSULTATION: Esophageal cancer  #  Oncology History   # MARCH 2018- ADENO CA; mod diff [lower third of esophagus; EGD Bx; Dr.Wohl]; CT- A/P- no distant mets/LN. PET- TxN1; no EUS.  # April 5th 2018- Carbo-taxol with RT  # March 2018- LEFT KIDNEY COMPLEX MASS ~2cm [incidental]; ~ 2 cm left lower lobe nodule [question second primary]  # Hx of Hyponatremia [[phone]; Dr.Lateef]  # MOLECULAR TESTING- Her 2 neu-NEGATIVEPDL-1- POSITIVE [CPS-2]; MMR- STABLE.      Malignant neoplasm of lower third of esophagus (HCC)     HISTORY OF PRESENTING ILLNESS:  Brittney Jackson 83 y.o.  female diagnosed adenocarcinoma the esophagus Lower one third is here to proceed with chemoradiation.   Otherwise denies any pain. Denies any blood in stools or black stools. Denies any tingling or numbness. Denies any significant history of reflux. The patient and her daughter are extremely concerned about her previous history of hyponatremia. Her multiple questions regarding. Patient continues to complain of "burping"; regurgitation with certain foods.  ROS: A complete 10 point review of system is done which is negative except mentioned above in history of present illness  MEDICAL HISTORY:  Past Medical History:  Diagnosis Date  . Arthritis    left knee  . Function kidney decreased   . Hypertension   . Hypothyroidism   . Inappropriate ADH secretion (HCC)   . Low sodium levels   . Osteoporosis     SURGICAL HISTORY: Past Surgical History:  Procedure Laterality Date  . CATARACT EXTRACTION W/PHACO Left 05/29/2015   Procedure: CATARACT EXTRACTION PHACO AND INTRAOCULAR LENS PLACEMENT (IOC);  Surgeon: Leandrew Koyanagi, MD;  Location: [facility];  Service: Ophthalmology;  Laterality: Left;  . CATARACT EXTRACTION  W/PHACO Right 01/15/2016   Procedure: CATARACT EXTRACTION PHACO AND INTRAOCULAR LENS PLACEMENT (IOC);  Surgeon: Leandrew Koyanagi, MD;  Location: [place];  Service: Ophthalmology;  Laterality: Right;  SHUGARCAINE  . ESOPHAGOGASTRODUODENOSCOPY (EGD) WITH PROPOFOL N/A 03/16/2017   Procedure: ESOPHAGOGASTRODUODENOSCOPY (EGD) WITH PROPOFOL;  Surgeon: [name], MD;  Location: [facility];  Service: Endoscopy;  Laterality: N/A;  . 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 Family is in the area.  Social History   Social History  . Marital status: Widowed    Spouse name: N/A  . Number of children: N/A  . Years of education: N/A   Occupational History  . 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 Smoking status: Former Smoker    Types: Cigarettes    Quit date: 07/21/2000  . Smokeless tobacco: Never Used  . Alcohol use No  . Drug use: No  . Sexual activity: No   Other Topics Concern  . Not on file   Social History Narrative   Church activities, community service.   Lives alone.    FAMILY HISTORY: Family History  Problem Relation Age of Onset  . Heart attack Mother   . Cancer Maternal Uncle     renal cancer   . COPD Neg Hx     no breast, ovarian or colon    ALLERGIES:  is allergic to amlodipine.  MEDICATIONS:  Current Outpatient Prescriptions  Medication Sig Dispense Refill  . labetalol (NORMODYNE) 300 MG tablet Take  0.5 tablets (150 mg total) by mouth 3 (three) times daily. 1/2 tab (150 mg) Am and PM (Patient taking differently: Take 150 mg by mouth 2 (two) times daily. 1/2 tab (150 mg) Am and PM) 270 tablet 1  . levothyroxine (SYNTHROID, LEVOTHROID) 88 MCG tablet TAKE ONE (1) TABLET EACH DAY 90 tablet 2  . losartan (COZAAR) 100 MG tablet TAKE ONE (1) TABLET BY MOUTH EVERY DAY 90 tablet 1  . aspirin 81 MG tablet Take 81 mg by mouth daily.    .  cholecalciferol (VITAMIN D) 1000 UNITS tablet Take 1,000 Units by mouth daily.    . hyoscyamine (LEVSIN SL) 0.125 MG SL tablet Place 1 tablet (0.125 mg total) under the tongue every 4 (four) hours as needed. (Patient not taking: Reported on 03/19/2017) 30 tablet 0  . Multiple Vitamins-Minerals (PRESERVISION AREDS PO) Take by mouth daily.    . ondansetron (ZOFRAN ODT) 4 MG disintegrating tablet Allow 1-2 tablets to dissolve in your mouth every 8 hours as needed for nausea/vomiting (Patient not taking: Reported on 03/19/2017) 30 tablet 0  . simvastatin (ZOCOR) 40 MG tablet TAKE ONE (1) TABLET AT BEDTIME (Patient not taking: Reported on 03/19/2017) 90 tablet 1   No current facility-administered medications for this visit.       Marland Kitchen  PHYSICAL EXAMINATION: ECOG PERFORMANCE STATUS: 0 - Asymptomatic  Vitals:   04/01/17 0932  BP: 118/74  Pulse: 67  Resp: 16  Temp: (!) 96.3 F (35.7 C)   Filed Weights   04/01/17 0932  Weight: 127 lb 6 oz (57.8 kg)    GENERAL: Well-nourished well-developed; Alert, no distress and comfortable.   Accompanied by daughter.  EYES: no pallor or icterus OROPHARYNX: no thrush or ulceration; good dentition  NECK: supple, no masses felt LYMPH:  no palpable lymphadenopathy in the cervical, axillary or inguinal regions LUNGS: clear to auscultation and  No wheeze or crackles HEART/CVS: regular rate & rhythm and no murmurs; No lower extremity edema ABDOMEN: abdomen soft, non-tender and normal bowel sounds Musculoskeletal:no cyanosis of digits and no clubbing  PSYCH: alert & oriented x 3 with fluent speech NEURO: no focal motor/sensory deficits SKIN:  no rashes or significant lesions  LABORATORY DATA:  I have reviewed the data as listed Lab Results  Component Value Date   WBC 7.9 04/01/2017   HGB 13.4 04/01/2017   HCT 39.0 04/01/2017   MCV 84.9 04/01/2017   PLT 344 04/01/2017    Recent Labs  02/26/17 03/14/17 1752 04/01/17 0850  NA 139 139 137  K 4.3 3.8  4.2  CL  --  106 104  CO2  --  30 28  GLUCOSE  --  119* 115*  BUN 17 20 24*  CREATININE 0.9 0.91 0.84  CALCIUM  --  9.4 9.4  GFRNONAA  --  56* >60  GFRAA  --  >60 >60  PROT  --  7.3 7.0  ALBUMIN  --  3.9 3.8  AST 20 25 25   ALT 15 17 22   ALKPHOS 166* 144* 130*  BILITOT  --  0.8 0.6    RADIOGRAPHIC STUDIES: I have personally reviewed the radiological images as listed and agreed with the findings in the report. Ct Abdomen Pelvis W Contrast  Result Date: 03/14/2017 CLINICAL DATA:  Difficulty swallowing. EXAM: CT ABDOMEN AND PELVIS WITH CONTRAST TECHNIQUE: Multidetector CT imaging of the abdomen and pelvis was performed using the standard protocol following bolus administration of intravenous contrast. CONTRAST:  133m ISOVUE-300 IOPAMIDOL (ISOVUE-300) INJECTION 61%  COMPARISON:  None. FINDINGS: Lower chest: There is a small hiatal hernia. There is a 2.2 cm segment of marked circumferential mucosal thickening causing a stricture of the distal esophagus. The visualized esophagus upstream to this abnormality is dilated and fluid-filled. Hepatobiliary: Mild hepatic steatosis.  Normal gallbladder. Pancreas: Unremarkable. No pancreatic ductal dilatation or surrounding inflammatory changes. Spleen: Normal in size without focal abnormality. Adrenals/Urinary Tract: Adrenal glands are unremarkable. Right kidney is normal. There is no evidence of hydronephrosis or nephrolithiasis bilaterally. There is a complex cystic and solid mass within the lateral cortex of the mid pole of the left kidney measuring 2.0 x 2.1 x 2.1 cm . Bladder is unremarkable. Stomach/Bowel: Stomach is within normal limits. No evidence of bowel wall thickening, distention, or inflammatory changes. Sigmoid colonic diverticulosis. Vascular/Lymphatic: Advanced aortic atherosclerosis with calcified and noncalcified plaque. No enlarged abdominal or pelvic lymph nodes. Reproductive: Uterus and bilateral adnexa are unremarkable. Other: No  abdominal wall hernia or abnormality. No abdominopelvic ascites. Musculoskeletal: No acute or significant osseous findings. IMPRESSION: Short segment of marked circumferential mucosal thickening causing a stricture of the distal esophagus with upstream esophageal dilation. Malignancy cannot be excluded. Further evaluation with upper endoscopy is recommended. Small hiatal hernia. 2.1 complex left renal mass. Malignancy cannot be excluded. MRI of the abdomen may be considered if further evaluation is desired. Hepatic steatosis. Advanced atherosclerotic disease of the aorta with calcified and noncalcified plaque. Electronically Signed   By: Fidela Salisbury M.D.   On: 03/14/2017 21:56   Nm Pet Image Initial (pi) Skull Base To Thigh  Result Date: 03/24/2017 CLINICAL DATA:  Initial treatment strategy for esophageal cancer. EXAM: NUCLEAR MEDICINE PET SKULL BASE TO THIGH TECHNIQUE: 12.6 mCi F-18 FDG was injected intravenously. Full-ring PET imaging was performed from the skull base to thigh after the radiotracer. CT data was obtained and used for attenuation correction and anatomic localization. FASTING BLOOD GLUCOSE:  Value: 103 mg/dl COMPARISON:  Multiple exams, including 03/14/2017 FINDINGS: NECK No hypermetabolic lymph nodes in the neck. Carotid atherosclerotic calcification noted. CHEST Abnormal wall thickening along a segment of the distal esophagus with associated hypermetabolic activity, maximum SUV 8.3. The esophagus proximal to this lesion is mildly dilated. There several small paraesophageal lymph nodes which are not individually recognizable as being hypermetabolic but nevertheless are right in the vicinity of the tumor and accordingly suspicious. For example a small adjacent 0.7 cm in short axis lymph node is present on image 113/3. Peripheral 0.9 by 0.8 cm left lower lobe nodule on image 108/3, maximum SUV 1.5. My impression on review of exams is that this lesion has slowly grown over the last 3 years  from a subtle bandlike structure in 2015 for more nodular appearance today, and also has small pulmonary arterial and pulmonary venous attachments. No other abnormal significant hypermetabolic activity in the chest. Coronary, aortic arch, and branch vessel atherosclerotic vascular disease. Calcified right lower paratracheal lymph node compatible with old granulomatous disease. Biapical pleuroparenchymal scarring. Calcified granuloma in the right middle lobe on image 101 measuring 6 by 4 mm. Bandlike atelectasis or scarring in the right lower lobe, image 111/3. Mild mosaic attenuation in both lungs. ABDOMEN/PELVIS No abnormal hypermetabolic activity within the liver, pancreas, adrenal glands, or spleen. No hypermetabolic lymph nodes in the abdomen or pelvis. Aortoiliac atherosclerotic vascular disease. Sigmoid colon diverticulosis. SKELETON Low-grade hypermetabolic activity through what appears to be a healing fracture of the S5 segment of the sacrum, maximum SUV 3.6. IMPRESSION: 1. Distal esophageal mass maximum SUV 8.3, compatible with  malignancy. Although the small adjacent paraesophageal lymph nodes are not discernibly individually hypermetabolic, they may well represent small local nodal involvement. No liver metastatic lesion identified. 2. There is an 8 by 9 mm left lower lobe nodule which appears to have slowly increased in size since 2015 from a original bandlike appearance. However, this nodule is not hypermetabolic, maximum SUV of only 1.5, suggesting a potential benign etiology. There is evidence of old granulomatous disease elsewhere in the chest but this nodule is not calcified. The nodule does have pulmonary arterial and pulmonary venous attachments and accordingly could represent mild enlargement of a pulmonary vascular malformation, but is not entirely specific on today's exam. I would recommend careful surveillance ; CT angiography of the chest may be helpful in assessing for pulmonary arterial  phase enhancement that might further favor a vascular malformation. 3. Low-grade hypermetabolic activity associated with what appears to be a healing nondisplaced transverse fracture of the S5 segment of the sacrum, correlate with any trauma or tenderness in this vicinity. 4. Mild mosaic attenuation in the lungs, potentially from air trapping. 5. Other imaging findings of potential clinical significance: Coronary, aortic arch, and branch vessel atherosclerotic vascular disease. Aortoiliac atherosclerotic vascular disease. Sigmoid colon diverticulosis. Carotid atherosclerotic calcification. Electronically Signed   By: Van Clines M.D.   On: 03/24/2017 11:14    ASSESSMENT & PLAN:   Malignant neoplasm of lower third of esophagus ([place]) # Moderate differentiated adenocarcinoma of the lower esophagus. PET- positive paraesophgeal uptake.Txn1- stage III.  No distant metastases. HOLD off EUS.   # Discussed concurrent chemoradiation; starting today with carbo-taxol weekly. I don't think patient is a surgical candidate given her age; discuss that this will be most likely definitive treatment.  # Discussed the potential side effects including but not limited to-increasing fatigue, nausea vomiting, diarrhea, hair loss, sores in the mouth, increase risk of infection and also neuropathy.   # Weight loss/difficulty swallowing- recommend dietary consultation. Plans to see dietary today.   # Left renal mass ~2cm; PET non-avid monitor for now.   # Hx of hyponatremia- I discussed with Dr.Lateef. Discussed re: use of salt laden fluids. Restrict free water to 1000 ml. we'll check weekly labs.  # LLL nodule- 7-53m;Stable since 2015.   # follow up in 1 week/ MD/labs/chemo; 2 weeks.   # I reviewed the blood work- with the patient in detail; also reviewed the imaging independently [as summarized above]; and with the patient in detail.   # 40 minutes face-to-face with the patient discussing the above plan of  care; more than 50% of time spent on prognosis/ natural history; counseling and coordination.     GCammie Sickle MD 04/02/2017 5:39 PM

## 2017-04-01 NOTE — Progress Notes (Signed)
Patient here today for follow up.  Patient c/o lots of burping, vomit her lunch yesterday

## 2017-04-01 NOTE — Assessment & Plan Note (Addendum)
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 PET- positive paraesophgeal uptake.Txn1- stage III.  No distant metastases. HOLD off EUS.   # Discussed concurrent chemoradiation; starting today with carbo-taxol weekly. I don't think patient is a surgical candidate given her age; discuss that this will be most likely definitive treatment.  # Discussed the potential side effects including but not limited to-increasing fatigue, nausea vomiting, diarrhea, hair loss, sores in the mouth, increase risk of infection and also neuropathy.   # Weight loss/difficulty swallowing- recommend dietary consultation. Plans to see dietary today.   # Left renal mass ~2cm; PET non-avid monitor for now.   # Hx of hyponatremia- I discussed with Dr.Lateef. Discussed re: use of salt laden fluids. Restrict free water to 1000 ml. we'll check weekly labs.  # LLL nodule- 7-70mm;Stable since 2015.   # follow up in 1 week/ MD/labs/chemo; 2 weeks.   # I reviewed the blood work- with the patient in detail; also reviewed the imaging independently [as summarized above]; and with the patient in detail.   # 40 minutes face-to-face with the patient discussing the above plan of care; more than 50% of time spent on prognosis/ natural history; counseling and coordination.

## 2017-04-02 DIAGNOSIS — Z515 Encounter for palliative care: Secondary | ICD-10-CM | POA: Diagnosis not present

## 2017-04-02 DIAGNOSIS — C155 Malignant neoplasm of lower third of esophagus: Secondary | ICD-10-CM | POA: Diagnosis not present

## 2017-04-02 DIAGNOSIS — R1310 Dysphagia, unspecified: Secondary | ICD-10-CM | POA: Diagnosis not present

## 2017-04-02 LAB — CEA: CEA: 3.6 ng/mL (ref 0.0–4.7)

## 2017-04-05 ENCOUNTER — Ambulatory Visit
Admission: RE | Admit: 2017-04-05 | Discharge: 2017-04-05 | Disposition: A | Discharge status: Home / Self Care | Payer: Medicare Other | Source: Ambulatory Visit | Attending: Radiation Oncology | Admitting: Radiation Oncology

## 2017-04-06 ENCOUNTER — Other Ambulatory Visit: Payer: Self-pay | Admitting: *Deleted

## 2017-04-06 ENCOUNTER — Ambulatory Visit
Admission: RE | Admit: 2017-04-06 | Discharge: 2017-04-06 | Disposition: A | Discharge status: Home / Self Care | Payer: Medicare Other | Source: Ambulatory Visit | Attending: Radiation Oncology | Admitting: Radiation Oncology

## 2017-04-06 DIAGNOSIS — I10 Essential (primary) hypertension: Secondary | ICD-10-CM | POA: Diagnosis not present

## 2017-04-06 DIAGNOSIS — Z87891 Personal history of nicotine dependence: Secondary | ICD-10-CM | POA: Diagnosis not present

## 2017-04-06 DIAGNOSIS — Z510 Encounter for antineoplastic radiation therapy: Secondary | ICD-10-CM | POA: Diagnosis not present

## 2017-04-06 DIAGNOSIS — E039 Hypothyroidism, unspecified: Secondary | ICD-10-CM | POA: Diagnosis not present

## 2017-04-06 DIAGNOSIS — M129 Arthropathy, unspecified: Secondary | ICD-10-CM | POA: Diagnosis not present

## 2017-04-06 DIAGNOSIS — C155 Malignant neoplasm of lower third of esophagus: Secondary | ICD-10-CM | POA: Diagnosis not present

## 2017-04-06 DIAGNOSIS — N2889 Other specified disorders of kidney and ureter: Secondary | ICD-10-CM | POA: Diagnosis not present

## 2017-04-06 MED ORDER — SUCRALFATE 1 G PO TABS: 1.0000 g | ORAL_TABLET | Freq: Three times a day (TID) | ORAL | 3 refills | Status: AC

## 2017-04-07 ENCOUNTER — Ambulatory Visit
Admission: RE | Admit: 2017-04-07 | Discharge: 2017-04-07 | Disposition: A | Discharge status: Home / Self Care | Payer: Medicare Other | Source: Ambulatory Visit | Attending: Radiation Oncology | Admitting: Radiation Oncology

## 2017-04-07 DIAGNOSIS — M129 Arthropathy, unspecified: Secondary | ICD-10-CM | POA: Diagnosis not present

## 2017-04-07 DIAGNOSIS — Z87891 Personal history of nicotine dependence: Secondary | ICD-10-CM | POA: Diagnosis not present

## 2017-04-07 DIAGNOSIS — N2889 Other specified disorders of kidney and ureter: Secondary | ICD-10-CM | POA: Diagnosis not present

## 2017-04-07 DIAGNOSIS — Z510 Encounter for antineoplastic radiation therapy: Secondary | ICD-10-CM | POA: Diagnosis not present

## 2017-04-07 DIAGNOSIS — E039 Hypothyroidism, unspecified: Secondary | ICD-10-CM | POA: Diagnosis not present

## 2017-04-07 DIAGNOSIS — I10 Essential (primary) hypertension: Secondary | ICD-10-CM | POA: Diagnosis not present

## 2017-04-07 DIAGNOSIS — C155 Malignant neoplasm of lower third of esophagus: Secondary | ICD-10-CM | POA: Diagnosis not present

## 2017-04-08 ENCOUNTER — Ambulatory Visit
Admission: RE | Admit: 2017-04-08 | Discharge: 2017-04-08 | Disposition: A | Discharge status: Home / Self Care | Payer: Medicare Other | Source: Ambulatory Visit | Attending: Radiation Oncology | Admitting: Radiation Oncology

## 2017-04-08 DIAGNOSIS — C155 Malignant neoplasm of lower third of esophagus: Secondary | ICD-10-CM | POA: Diagnosis not present

## 2017-04-08 DIAGNOSIS — I10 Essential (primary) hypertension: Secondary | ICD-10-CM | POA: Diagnosis not present

## 2017-04-08 DIAGNOSIS — N2889 Other specified disorders of kidney and ureter: Secondary | ICD-10-CM | POA: Diagnosis not present

## 2017-04-08 DIAGNOSIS — M129 Arthropathy, unspecified: Secondary | ICD-10-CM | POA: Diagnosis not present

## 2017-04-08 DIAGNOSIS — Z87891 Personal history of nicotine dependence: Secondary | ICD-10-CM | POA: Diagnosis not present

## 2017-04-08 DIAGNOSIS — Z510 Encounter for antineoplastic radiation therapy: Secondary | ICD-10-CM | POA: Diagnosis not present

## 2017-04-08 DIAGNOSIS — E039 Hypothyroidism, unspecified: Secondary | ICD-10-CM | POA: Diagnosis not present

## 2017-04-09 ENCOUNTER — Inpatient Hospital Stay: Payer: Medicare Other

## 2017-04-09 ENCOUNTER — Inpatient Hospital Stay (HOSPITAL_BASED_OUTPATIENT_CLINIC_OR_DEPARTMENT_OTHER): Payer: Medicare Other | Admitting: Internal Medicine

## 2017-04-09 ENCOUNTER — Ambulatory Visit
Admission: RE | Admit: 2017-04-09 | Discharge: 2017-04-09 | Disposition: A | Discharge status: Home / Self Care | Payer: Medicare Other | Source: Ambulatory Visit | Attending: Radiation Oncology | Admitting: Radiation Oncology

## 2017-04-09 VITALS — BP 122/66 | HR 79 | Temp 97.8°F | Resp 16 | Ht 60.0 in | Wt 131.0 lb

## 2017-04-09 VITALS — BP 154/73 | HR 84

## 2017-04-09 DIAGNOSIS — R634 Abnormal weight loss: Secondary | ICD-10-CM

## 2017-04-09 DIAGNOSIS — R1310 Dysphagia, unspecified: Secondary | ICD-10-CM | POA: Diagnosis not present

## 2017-04-09 DIAGNOSIS — Z87891 Personal history of nicotine dependence: Secondary | ICD-10-CM

## 2017-04-09 DIAGNOSIS — I10 Essential (primary) hypertension: Secondary | ICD-10-CM

## 2017-04-09 DIAGNOSIS — Z5111 Encounter for antineoplastic chemotherapy: Secondary | ICD-10-CM | POA: Diagnosis not present

## 2017-04-09 DIAGNOSIS — Z7982 Long term (current) use of aspirin: Secondary | ICD-10-CM

## 2017-04-09 DIAGNOSIS — R911 Solitary pulmonary nodule: Secondary | ICD-10-CM | POA: Diagnosis not present

## 2017-04-09 DIAGNOSIS — Z510 Encounter for antineoplastic radiation therapy: Secondary | ICD-10-CM | POA: Diagnosis not present

## 2017-04-09 DIAGNOSIS — M129 Arthropathy, unspecified: Secondary | ICD-10-CM | POA: Diagnosis not present

## 2017-04-09 DIAGNOSIS — Z8051 Family history of malignant neoplasm of kidney: Secondary | ICD-10-CM

## 2017-04-09 DIAGNOSIS — C155 Malignant neoplasm of lower third of esophagus: Secondary | ICD-10-CM

## 2017-04-09 DIAGNOSIS — N2889 Other specified disorders of kidney and ureter: Secondary | ICD-10-CM | POA: Diagnosis not present

## 2017-04-09 DIAGNOSIS — K449 Diaphragmatic hernia without obstruction or gangrene: Secondary | ICD-10-CM

## 2017-04-09 DIAGNOSIS — M818 Other osteoporosis without current pathological fracture: Secondary | ICD-10-CM | POA: Diagnosis not present

## 2017-04-09 DIAGNOSIS — E039 Hypothyroidism, unspecified: Secondary | ICD-10-CM

## 2017-04-09 DIAGNOSIS — E871 Hypo-osmolality and hyponatremia: Secondary | ICD-10-CM | POA: Diagnosis not present

## 2017-04-09 DIAGNOSIS — E222 Syndrome of inappropriate secretion of antidiuretic hormone: Secondary | ICD-10-CM

## 2017-04-09 DIAGNOSIS — I700 Atherosclerosis of aorta: Secondary | ICD-10-CM

## 2017-04-09 DIAGNOSIS — K760 Fatty (change of) liver, not elsewhere classified: Secondary | ICD-10-CM

## 2017-04-09 DIAGNOSIS — R630 Anorexia: Secondary | ICD-10-CM | POA: Diagnosis not present

## 2017-04-09 DIAGNOSIS — Z79899 Other long term (current) drug therapy: Secondary | ICD-10-CM

## 2017-04-09 LAB — BASIC METABOLIC PANEL
ANION GAP: 6 (ref 5–15)
BUN: 29 mg/dL — ABNORMAL HIGH (ref 6–20)
CALCIUM: 9.2 mg/dL (ref 8.9–10.3)
CO2: 30 mmol/L (ref 22–32)
Chloride: 101 mmol/L (ref 101–111)
Creatinine, Ser: 0.95 mg/dL (ref 0.44–1.00)
GFR, EST NON AFRICAN AMERICAN: 53 mL/min — AB (ref >60)
GLUCOSE: 107 mg/dL — AB (ref 65–99)
POTASSIUM: 4.3 mmol/L (ref 3.5–5.1)
Sodium: 137 mmol/L (ref 135–145)

## 2017-04-09 LAB — CBC WITH DIFFERENTIAL/PLATELET
BASOS ABS: 0.1 10*3/uL (ref 0–0.1)
BASOS PCT: 2 %
Eosinophils Absolute: 0.2 10*3/uL (ref 0–0.7)
Eosinophils Relative: 4 %
HEMATOCRIT: 36.4 % (ref 35.0–47.0)
HEMOGLOBIN: 12.6 g/dL (ref 12.0–16.0)
LYMPHS PCT: 29 %
Lymphs Abs: 1.7 10*3/uL (ref 1.0–3.6)
MCH: 29.5 pg (ref 26.0–34.0)
MCHC: 34.6 g/dL (ref 32.0–36.0)
MCV: 85.2 fL (ref 80.0–100.0)
Monocytes Absolute: 0.6 10*3/uL (ref 0.2–0.9)
Monocytes Relative: 10 %
NEUTROS ABS: 3.4 10*3/uL (ref 1.4–6.5)
NEUTROS PCT: 55 %
Platelets: 310 10*3/uL (ref 150–440)
RBC: 4.28 MIL/uL (ref 3.80–5.20)
RDW: 14.9 % — ABNORMAL HIGH (ref 11.5–14.5)
WBC: 6 10*3/uL (ref 3.6–11.0)

## 2017-04-09 MED ORDER — DIPHENHYDRAMINE HCL 50 MG/ML IJ SOLN
50.0000 mg | Freq: Once | INTRAMUSCULAR | Status: AC
  Administered 2017-04-09: 25 mg via INTRAVENOUS
  Filled 2017-04-09: qty 1

## 2017-04-09 MED ORDER — SODIUM CHLORIDE 0.9 % IV SOLN
50.0000 mg/m2 | Freq: Once | INTRAVENOUS | Status: AC
  Administered 2017-04-09: 78 mg via INTRAVENOUS
  Filled 2017-04-09: qty 13

## 2017-04-09 MED ORDER — SODIUM CHLORIDE 0.9 % IV SOLN
Freq: Once | INTRAVENOUS | Status: AC
  Administered 2017-04-09: 11:00:00 via INTRAVENOUS
  Filled 2017-04-09: qty 1000

## 2017-04-09 MED ORDER — FAMOTIDINE IN NACL 20-0.9 MG/50ML-% IV SOLN
20.0000 mg | Freq: Once | INTRAVENOUS | Status: AC
  Administered 2017-04-09: 20 mg via INTRAVENOUS
  Filled 2017-04-09: qty 50

## 2017-04-09 MED ORDER — PALONOSETRON HCL INJECTION 0.25 MG/5ML
0.2500 mg | Freq: Once | INTRAVENOUS | Status: AC
  Administered 2017-04-09: 0.25 mg via INTRAVENOUS
  Filled 2017-04-09: qty 5

## 2017-04-09 MED ORDER — SODIUM CHLORIDE 0.9 % IV SOLN
20.0000 mg | Freq: Once | INTRAVENOUS | Status: AC
  Administered 2017-04-09: 20 mg via INTRAVENOUS
  Filled 2017-04-09: qty 2

## 2017-04-09 MED ORDER — SODIUM CHLORIDE 0.9 % IV SOLN
128.0000 mg | Freq: Once | INTRAVENOUS | Status: AC
  Administered 2017-04-09: 130 mg via INTRAVENOUS
  Filled 2017-04-09: qty 13

## 2017-04-09 NOTE — Progress Notes (Signed)
[facility] NOTE  Patient Care Team: Crecencio Mc, MD as PCP - General (Internal Medicine) Clent Jacks, RN as Registered Nurse  CHIEF COMPLAINTS/PURPOSE OF CONSULTATION: Esophageal cancer  #  Oncology History   # MARCH 2018- ADENO CA; mod diff [lower third of esophagus; EGD Bx; Dr.Wohl]; CT- A/P- no distant mets/LN. PET- TxN1; no EUS.  # April 5th 2018- Carbo-taxol with RT  # March 2018- LEFT KIDNEY COMPLEX MASS ~2cm [incidental]; ~ 2 cm left lower lobe nodule [question second primary]  # Hx of Hyponatremia [~[phone]; Dr.Lateef]  # MOLECULAR TESTING- Her 2 neu-NEGATIVE; PDL-1- POSITIVE [CPS-2]; MMR- STABLE.      Malignant neoplasm of lower third of esophagus (HCC)     HISTORY OF PRESENTING ILLNESS:  Brittney Jackson 83 y.o.  female diagnosed adenocarcinoma the Lower esophagus currently on chemoradiation is here for follow-up.   Denies any difficulty swallowing. Denies any worsening pain.  Denies any tingling or numbness. Denies any significant history of reflux. No reactions from chemotherapy. She has met with dietary. No constipation. No nausea or vomiting from chemotherapy.  ROS: A complete 10 point review of system is done which is negative except mentioned above in history of present illness  MEDICAL HISTORY:  Past Medical History:  Diagnosis Date  . Arthritis    left knee  . Function kidney decreased   . Hypertension   . Hypothyroidism   . Inappropriate ADH secretion (HCC)   . Low sodium levels   . Osteoporosis     SURGICAL HISTORY: Past Surgical History:  Procedure Laterality Date  . CATARACT EXTRACTION W/PHACO Left 05/29/2015   Procedure: CATARACT EXTRACTION PHACO AND INTRAOCULAR LENS PLACEMENT (IOC);  Surgeon: Leandrew Koyanagi, MD;  Location: [facility];  Service: Ophthalmology;  Laterality: Left;  . CATARACT EXTRACTION W/PHACO Right 01/15/2016   Procedure: CATARACT EXTRACTION PHACO AND INTRAOCULAR LENS PLACEMENT  (IOC);  Surgeon: Leandrew Koyanagi, MD;  Location: [facility];  Service: Ophthalmology;  Laterality: Right;  SHUGARCAINE  . ESOPHAGOGASTRODUODENOSCOPY (EGD) WITH PROPOFOL N/A 03/16/2017   Procedure: ESOPHAGOGASTRODUODENOSCOPY (EGD) WITH PROPOFOL;  Surgeon: [name], MD;  Location: [facility];  Service: Endoscopy;  Laterality: N/A;  . 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 Family is in the area.  Social History   Social History  . Marital status: Widowed    Spouse name: N/A  . Number of children: N/A  . Years of education: N/A   Occupational History  . 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 Smoking status: Former Smoker    Types: Cigarettes    Quit date: 07/21/2000  . Smokeless tobacco: Never Used  . Alcohol use No  . Drug use: No  . Sexual activity: No   Other Topics Concern  . Not on file   Social History Narrative   Church activities, community service.   Lives alone.    FAMILY HISTORY: Family History  Problem Relation Age of Onset  . Heart attack Mother   . Cancer Maternal Uncle     renal cancer   . COPD Neg Hx     no breast, ovarian or colon    ALLERGIES:  is allergic to amlodipine.  MEDICATIONS:  Current Outpatient Prescriptions  Medication Sig Dispense Refill  . labetalol (NORMODYNE) 300 MG tablet Take 0.5 tablets (150 mg total) by mouth 3 (three) times daily. 1/2 tab (150  mg) Am and PM (Patient taking differently: Take 150 mg by mouth 2 (two) times daily. 1/2 tab (150 mg) Am and PM) 270 tablet 1  . levothyroxine (SYNTHROID, LEVOTHROID) 88 MCG tablet TAKE ONE (1) TABLET EACH DAY 90 tablet 2  . losartan (COZAAR) 100 MG tablet TAKE ONE (1) TABLET BY MOUTH EVERY DAY 90 tablet 1  . aspirin 81 MG tablet Take 81 mg by mouth daily.    . cholecalciferol (VITAMIN D) 1000 UNITS tablet Take 1,000 Units by mouth daily.    . hyoscyamine (LEVSIN SL)  0.125 MG SL tablet Place 1 tablet (0.125 mg total) under the tongue every 4 (four) hours as needed. (Patient not taking: Reported on 03/19/2017) 30 tablet 0  . Multiple Vitamins-Minerals (PRESERVISION AREDS PO) Take by mouth daily.    . ondansetron (ZOFRAN ODT) 4 MG disintegrating tablet Allow 1-2 tablets to dissolve in your mouth every 8 hours as needed for nausea/vomiting (Patient not taking: Reported on 03/19/2017) 30 tablet 0  . simvastatin (ZOCOR) 40 MG tablet TAKE ONE (1) TABLET AT BEDTIME (Patient not taking: Reported on 03/19/2017) 90 tablet 1  . sucralfate (CARAFATE) 1 g tablet Take 1 tablet (1 g total) by mouth 4 (four) times daily -  with meals and at bedtime. Dissolve in warm water, swish and swallow (Patient not taking: Reported on 04/09/2017) 90 tablet 3   No current facility-administered medications for this visit.    Facility-Administered Medications Ordered in Other Visits  Medication Dose Route Frequency Provider Last Rate Last Dose  . CARBOplatin (PARAPLATIN) 130 mg in sodium chloride 0.9 % 250 mL chemo infusion  130 mg Intravenous Once Cammie Sickle, MD          .  PHYSICAL EXAMINATION: ECOG PERFORMANCE STATUS: 0 - Asymptomatic  Vitals:   04/09/17 1015  BP: 122/66  Pulse: 79  Resp: 16  Temp: 97.8 F (36.6 C)   Filed Weights   04/09/17 1021  Weight: 131 lb (59.4 kg)    GENERAL: Well-nourished well-developed; Alert, no distress and comfortable.   Accompanied by daughter.  EYES: no pallor or icterus OROPHARYNX: no thrush or ulceration; good dentition  NECK: supple, no masses felt LYMPH:  no palpable lymphadenopathy in the cervical, axillary or inguinal regions LUNGS: clear to auscultation and  No wheeze or crackles HEART/CVS: regular rate & rhythm and no murmurs; No lower extremity edema ABDOMEN: abdomen soft, non-tender and normal bowel sounds Musculoskeletal:no cyanosis of digits and no clubbing  PSYCH: alert & oriented x 3 with fluent speech NEURO: no  focal motor/sensory deficits SKIN:  no rashes or significant lesions  LABORATORY DATA:  I have reviewed the data as listed Lab Results  Component Value Date   WBC 6.0 04/09/2017   HGB 12.6 04/09/2017   HCT 36.4 04/09/2017   MCV 85.2 04/09/2017   PLT 310 04/09/2017    Recent Labs  02/26/17 03/14/17 1752 04/01/17 0850 04/09/17 0943  NA 139 139 137 137  K 4.3 3.8 4.2 4.3  CL  --  106 104 101  CO2  --  30 28 30   GLUCOSE  --  119* 115* 107*  BUN 17 20 24* 29*  CREATININE 0.9 0.91 0.84 0.95  CALCIUM  --  9.4 9.4 9.2  GFRNONAA  --  56* >60 53*  GFRAA  --  >60 >60 >60  PROT  --  7.3 7.0  --   ALBUMIN  --  3.9 3.8  --   AST 20 25 25   --  ALT 15 17 22   --   ALKPHOS 166* 144* 130*  --   BILITOT  --  0.8 0.6  --     RADIOGRAPHIC STUDIES: I have personally reviewed the radiological images as listed and agreed with the findings in the report. Ct Abdomen Pelvis W Contrast  Result Date: 03/14/2017 CLINICAL DATA:  Difficulty swallowing. EXAM: CT ABDOMEN AND PELVIS WITH CONTRAST TECHNIQUE: Multidetector CT imaging of the abdomen and pelvis was performed using the standard protocol following bolus administration of intravenous contrast. CONTRAST:  15m ISOVUE-300 IOPAMIDOL (ISOVUE-300) INJECTION 61% COMPARISON:  None. FINDINGS: Lower chest: There is a small hiatal hernia. There is a 2.2 cm segment of marked circumferential mucosal thickening causing a stricture of the distal esophagus. The visualized esophagus upstream to this abnormality is dilated and fluid-filled. Hepatobiliary: Mild hepatic steatosis.  Normal gallbladder. Pancreas: Unremarkable. No pancreatic ductal dilatation or surrounding inflammatory changes. Spleen: Normal in size without focal abnormality. Adrenals/Urinary Tract: Adrenal glands are unremarkable. Right kidney is normal. There is no evidence of hydronephrosis or nephrolithiasis bilaterally. There is a complex cystic and solid mass within the lateral cortex of the mid  pole of the left kidney measuring 2.0 x 2.1 x 2.1 cm . Bladder is unremarkable. Stomach/Bowel: Stomach is within normal limits. No evidence of bowel wall thickening, distention, or inflammatory changes. Sigmoid colonic diverticulosis. Vascular/Lymphatic: Advanced aortic atherosclerosis with calcified and noncalcified plaque. No enlarged abdominal or pelvic lymph nodes. Reproductive: Uterus and bilateral adnexa are unremarkable. Other: No abdominal wall hernia or abnormality. No abdominopelvic ascites. Musculoskeletal: No acute or significant osseous findings. IMPRESSION: Short segment of marked circumferential mucosal thickening causing a stricture of the distal esophagus with upstream esophageal dilation. Malignancy cannot be excluded. Further evaluation with upper endoscopy is recommended. Small hiatal hernia. 2.1 complex left renal mass. Malignancy cannot be excluded. MRI of the abdomen may be considered if further evaluation is desired. Hepatic steatosis. Advanced atherosclerotic disease of the aorta with calcified and noncalcified plaque. Electronically Signed   By: DFidela SalisburyM.D.   On: 03/14/2017 21:56   Nm Pet Image Initial (pi) Skull Base To Thigh  Result Date: 03/24/2017 CLINICAL DATA:  Initial treatment strategy for esophageal cancer. EXAM: NUCLEAR MEDICINE PET SKULL BASE TO THIGH TECHNIQUE: 12.6 mCi F-18 FDG was injected intravenously. Full-ring PET imaging was performed from the skull base to thigh after the radiotracer. CT data was obtained and used for attenuation correction and anatomic localization. FASTING BLOOD GLUCOSE:  Value: 103 mg/dl COMPARISON:  Multiple exams, including 03/14/2017 FINDINGS: NECK No hypermetabolic lymph nodes in the neck. Carotid atherosclerotic calcification noted. CHEST Abnormal wall thickening along a segment of the distal esophagus with associated hypermetabolic activity, maximum SUV 8.3. The esophagus proximal to this lesion is mildly dilated. There several  small paraesophageal lymph nodes which are not individually recognizable as being hypermetabolic but nevertheless are right in the vicinity of the tumor and accordingly suspicious. For example a small adjacent 0.7 cm in short axis lymph node is present on image 113/3. Peripheral 0.9 by 0.8 cm left lower lobe nodule on image 108/3, maximum SUV 1.5. My impression on review of exams is that this lesion has slowly grown over the last 3 years from a subtle bandlike structure in 2015 for more nodular appearance today, and also has small pulmonary arterial and pulmonary venous attachments. No other abnormal significant hypermetabolic activity in the chest. Coronary, aortic arch, and branch vessel atherosclerotic vascular disease. Calcified right lower paratracheal lymph node compatible with old granulomatous  disease. Biapical pleuroparenchymal scarring. Calcified granuloma in the right middle lobe on image 101 measuring 6 by 4 mm. Bandlike atelectasis or scarring in the right lower lobe, image 111/3. Mild mosaic attenuation in both lungs. ABDOMEN/PELVIS No abnormal hypermetabolic activity within the liver, pancreas, adrenal glands, or spleen. No hypermetabolic lymph nodes in the abdomen or pelvis. Aortoiliac atherosclerotic vascular disease. Sigmoid colon diverticulosis. SKELETON Low-grade hypermetabolic activity through what appears to be a healing fracture of the S5 segment of the sacrum, maximum SUV 3.6. IMPRESSION: 1. Distal esophageal mass maximum SUV 8.3, compatible with malignancy. Although the small adjacent paraesophageal lymph nodes are not discernibly individually hypermetabolic, they may well represent small local nodal involvement. No liver metastatic lesion identified. 2. There is an 8 by 9 mm left lower lobe nodule which appears to have slowly increased in size since 2015 from a original bandlike appearance. However, this nodule is not hypermetabolic, maximum SUV of only 1.5, suggesting a potential benign  etiology. There is evidence of old granulomatous disease elsewhere in the chest but this nodule is not calcified. The nodule does have pulmonary arterial and pulmonary venous attachments and accordingly could represent mild enlargement of a pulmonary vascular malformation, but is not entirely specific on today's exam. I would recommend careful surveillance ; CT angiography of the chest may be helpful in assessing for pulmonary arterial phase enhancement that might further favor a vascular malformation. 3. Low-grade hypermetabolic activity associated with what appears to be a healing nondisplaced transverse fracture of the S5 segment of the sacrum, correlate with any trauma or tenderness in this vicinity. 4. Mild mosaic attenuation in the lungs, potentially from air trapping. 5. Other imaging findings of potential clinical significance: Coronary, aortic arch, and branch vessel atherosclerotic vascular disease. Aortoiliac atherosclerotic vascular disease. Sigmoid colon diverticulosis. Carotid atherosclerotic calcification. Electronically Signed   By: Van Clines M.D.   On: 03/24/2017 11:14    ASSESSMENT & PLAN:   Malignant neoplasm of lower third of esophagus ([facility]) # Moderate differentiated adenocarcinoma of the lower esophagus. PET- positive paraesophgeal uptake.Txn1- stage III. On concurrent chemoradiation- carbo-taxol weekly. Tolerated treatment well.  # Proceed with cycle #2 carbotaxol weekly. Labs today reviewed;  acceptable for treatment today.   # Weight loss/difficulty swallowing- s/p dietary consultation.   # Hx of hyponatremia- I discussed with Dr.Lateef. Restrict free water to 1000 ml. we'll check weekly labs.  # LLL nodule- 7-72m;Stable since 2015.   # follow up in 1 week/ MD/labs/chemo; 2 weeks.      GCammie Sickle MD 04/09/2017 1:15 PM

## 2017-04-09 NOTE — Assessment & Plan Note (Addendum)
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 PET- positive paraesophgeal uptake.Txn1- stage III. On concurrent chemoradiation- carbo-taxol weekly. Tolerated treatment well.  # Proceed with cycle #2 carbotaxol weekly. Labs today reviewed;  acceptable for treatment today.   # Weight loss/difficulty swallowing- s/p dietary consultation.   # Hx of hyponatremia- I discussed with Dr.Lateef. Restrict free water to 1000 ml. we'll check weekly labs.  # LLL nodule- 7-10mm;Stable since 2015.   # follow up in 1 week/ MD/labs/chemo; 2 weeks.

## 2017-04-12 ENCOUNTER — Ambulatory Visit
Admission: RE | Admit: 2017-04-12 | Discharge: 2017-04-12 | Disposition: A | Discharge status: Home / Self Care | Payer: Medicare Other | Source: Ambulatory Visit | Attending: Radiation Oncology | Admitting: Radiation Oncology

## 2017-04-12 DIAGNOSIS — Z87891 Personal history of nicotine dependence: Secondary | ICD-10-CM | POA: Diagnosis not present

## 2017-04-12 DIAGNOSIS — M129 Arthropathy, unspecified: Secondary | ICD-10-CM | POA: Diagnosis not present

## 2017-04-12 DIAGNOSIS — I10 Essential (primary) hypertension: Secondary | ICD-10-CM | POA: Diagnosis not present

## 2017-04-12 DIAGNOSIS — N2889 Other specified disorders of kidney and ureter: Secondary | ICD-10-CM | POA: Diagnosis not present

## 2017-04-12 DIAGNOSIS — Z510 Encounter for antineoplastic radiation therapy: Secondary | ICD-10-CM | POA: Diagnosis not present

## 2017-04-12 DIAGNOSIS — E039 Hypothyroidism, unspecified: Secondary | ICD-10-CM | POA: Diagnosis not present

## 2017-04-12 DIAGNOSIS — C155 Malignant neoplasm of lower third of esophagus: Secondary | ICD-10-CM | POA: Diagnosis not present

## 2017-04-13 ENCOUNTER — Ambulatory Visit
Admission: RE | Admit: 2017-04-13 | Discharge: 2017-04-13 | Disposition: A | Discharge status: Home / Self Care | Payer: Medicare Other | Source: Ambulatory Visit | Attending: Radiation Oncology | Admitting: Radiation Oncology

## 2017-04-13 DIAGNOSIS — Z87891 Personal history of nicotine dependence: Secondary | ICD-10-CM | POA: Diagnosis not present

## 2017-04-13 DIAGNOSIS — M129 Arthropathy, unspecified: Secondary | ICD-10-CM | POA: Diagnosis not present

## 2017-04-13 DIAGNOSIS — C155 Malignant neoplasm of lower third of esophagus: Secondary | ICD-10-CM | POA: Diagnosis not present

## 2017-04-13 DIAGNOSIS — Z510 Encounter for antineoplastic radiation therapy: Secondary | ICD-10-CM | POA: Diagnosis not present

## 2017-04-13 DIAGNOSIS — E039 Hypothyroidism, unspecified: Secondary | ICD-10-CM | POA: Diagnosis not present

## 2017-04-13 DIAGNOSIS — N2889 Other specified disorders of kidney and ureter: Secondary | ICD-10-CM | POA: Diagnosis not present

## 2017-04-13 DIAGNOSIS — I10 Essential (primary) hypertension: Secondary | ICD-10-CM | POA: Diagnosis not present

## 2017-04-14 ENCOUNTER — Ambulatory Visit
Admission: RE | Admit: 2017-04-14 | Discharge: 2017-04-14 | Disposition: A | Discharge status: Home / Self Care | Payer: Medicare Other | Source: Ambulatory Visit | Attending: Radiation Oncology | Admitting: Radiation Oncology

## 2017-04-14 ENCOUNTER — Ambulatory Visit: Payer: Medicare Other

## 2017-04-14 DIAGNOSIS — I10 Essential (primary) hypertension: Secondary | ICD-10-CM | POA: Diagnosis not present

## 2017-04-14 DIAGNOSIS — C155 Malignant neoplasm of lower third of esophagus: Secondary | ICD-10-CM | POA: Diagnosis not present

## 2017-04-14 DIAGNOSIS — N2889 Other specified disorders of kidney and ureter: Secondary | ICD-10-CM | POA: Diagnosis not present

## 2017-04-14 DIAGNOSIS — M129 Arthropathy, unspecified: Secondary | ICD-10-CM | POA: Diagnosis not present

## 2017-04-14 DIAGNOSIS — Z87891 Personal history of nicotine dependence: Secondary | ICD-10-CM | POA: Diagnosis not present

## 2017-04-14 DIAGNOSIS — Z510 Encounter for antineoplastic radiation therapy: Secondary | ICD-10-CM | POA: Diagnosis not present

## 2017-04-14 DIAGNOSIS — E039 Hypothyroidism, unspecified: Secondary | ICD-10-CM | POA: Diagnosis not present

## 2017-04-15 ENCOUNTER — Inpatient Hospital Stay: Payer: Medicare Other

## 2017-04-15 ENCOUNTER — Ambulatory Visit
Admission: RE | Admit: 2017-04-15 | Discharge: 2017-04-15 | Disposition: A | Discharge status: Home / Self Care | Payer: Medicare Other | Source: Ambulatory Visit | Attending: Radiation Oncology | Admitting: Radiation Oncology

## 2017-04-15 DIAGNOSIS — C155 Malignant neoplasm of lower third of esophagus: Secondary | ICD-10-CM | POA: Diagnosis not present

## 2017-04-15 DIAGNOSIS — I10 Essential (primary) hypertension: Secondary | ICD-10-CM | POA: Diagnosis not present

## 2017-04-15 DIAGNOSIS — E039 Hypothyroidism, unspecified: Secondary | ICD-10-CM | POA: Diagnosis not present

## 2017-04-15 DIAGNOSIS — Z510 Encounter for antineoplastic radiation therapy: Secondary | ICD-10-CM | POA: Diagnosis not present

## 2017-04-15 DIAGNOSIS — Z87891 Personal history of nicotine dependence: Secondary | ICD-10-CM | POA: Diagnosis not present

## 2017-04-15 DIAGNOSIS — N2889 Other specified disorders of kidney and ureter: Secondary | ICD-10-CM | POA: Diagnosis not present

## 2017-04-15 DIAGNOSIS — M129 Arthropathy, unspecified: Secondary | ICD-10-CM | POA: Diagnosis not present

## 2017-04-15 NOTE — Progress Notes (Signed)
Nutrition Follow-up:  Nutrition follow-up completed after radiation treatment.    Patient reports eating semi solid foods and drinking Boost Very High Calorie supplement. Does not really like the supplement drink but drinking it anyway.   Has started mouth rinse but is not having any problems with sore mouth or dry mouth.    Medications: reviewed  Labs: glucose 107, BUN 29, creatinine WDL  Anthropometrics:   Patient reports weight on 4/17 was 132 lb increased from weight on 4/5 of 127 lb 6 oz.   NUTRITION DIAGNOSIS: Malnutrition severe improving   MALNUTRITION DIAGNOSIS: Severe malnutrition improving with weight gain   INTERVENTION:   Encouraged patient to continue with consuming high calorie, high protein foods.  Patient very familiar with nutrition strategies and ways to increase calories and protein.   Patient will continue with oral nutrition supplements as tolerated.      MONITORING, EVALUATION, GOAL: Patient will consume high calorie, high protein foods to prevent weight loss   NEXT VISIT: May 3rd after radiation  Brittney Jackson B. Zenia Resides, [place], [place] Registered Dietitian [phone] (pager)

## 2017-04-16 ENCOUNTER — Inpatient Hospital Stay (HOSPITAL_BASED_OUTPATIENT_CLINIC_OR_DEPARTMENT_OTHER): Payer: Medicare Other | Admitting: Internal Medicine

## 2017-04-16 ENCOUNTER — Inpatient Hospital Stay: Payer: Medicare Other

## 2017-04-16 ENCOUNTER — Ambulatory Visit
Admission: RE | Admit: 2017-04-16 | Discharge: 2017-04-16 | Disposition: A | Discharge status: Home / Self Care | Payer: Medicare Other | Source: Ambulatory Visit | Attending: Radiation Oncology | Admitting: Radiation Oncology

## 2017-04-16 VITALS — BP 143/78 | HR 81 | Temp 97.8°F | Resp 18 | Ht 60.0 in | Wt 131.0 lb

## 2017-04-16 DIAGNOSIS — C155 Malignant neoplasm of lower third of esophagus: Secondary | ICD-10-CM

## 2017-04-16 DIAGNOSIS — Z8051 Family history of malignant neoplasm of kidney: Secondary | ICD-10-CM

## 2017-04-16 DIAGNOSIS — M818 Other osteoporosis without current pathological fracture: Secondary | ICD-10-CM | POA: Diagnosis not present

## 2017-04-16 DIAGNOSIS — K449 Diaphragmatic hernia without obstruction or gangrene: Secondary | ICD-10-CM | POA: Diagnosis not present

## 2017-04-16 DIAGNOSIS — I10 Essential (primary) hypertension: Secondary | ICD-10-CM | POA: Diagnosis not present

## 2017-04-16 DIAGNOSIS — E039 Hypothyroidism, unspecified: Secondary | ICD-10-CM | POA: Diagnosis not present

## 2017-04-16 DIAGNOSIS — E871 Hypo-osmolality and hyponatremia: Secondary | ICD-10-CM

## 2017-04-16 DIAGNOSIS — R634 Abnormal weight loss: Secondary | ICD-10-CM

## 2017-04-16 DIAGNOSIS — Z87891 Personal history of nicotine dependence: Secondary | ICD-10-CM | POA: Diagnosis not present

## 2017-04-16 DIAGNOSIS — E222 Syndrome of inappropriate secretion of antidiuretic hormone: Secondary | ICD-10-CM

## 2017-04-16 DIAGNOSIS — R630 Anorexia: Secondary | ICD-10-CM

## 2017-04-16 DIAGNOSIS — N2889 Other specified disorders of kidney and ureter: Secondary | ICD-10-CM

## 2017-04-16 DIAGNOSIS — Z5111 Encounter for antineoplastic chemotherapy: Secondary | ICD-10-CM | POA: Diagnosis not present

## 2017-04-16 DIAGNOSIS — R1310 Dysphagia, unspecified: Secondary | ICD-10-CM

## 2017-04-16 DIAGNOSIS — K760 Fatty (change of) liver, not elsewhere classified: Secondary | ICD-10-CM

## 2017-04-16 DIAGNOSIS — Z510 Encounter for antineoplastic radiation therapy: Secondary | ICD-10-CM | POA: Diagnosis not present

## 2017-04-16 DIAGNOSIS — M129 Arthropathy, unspecified: Secondary | ICD-10-CM | POA: Diagnosis not present

## 2017-04-16 DIAGNOSIS — Z79899 Other long term (current) drug therapy: Secondary | ICD-10-CM

## 2017-04-16 DIAGNOSIS — Z7982 Long term (current) use of aspirin: Secondary | ICD-10-CM

## 2017-04-16 DIAGNOSIS — I700 Atherosclerosis of aorta: Secondary | ICD-10-CM

## 2017-04-16 DIAGNOSIS — R911 Solitary pulmonary nodule: Secondary | ICD-10-CM | POA: Diagnosis not present

## 2017-04-16 LAB — COMPREHENSIVE METABOLIC PANEL
ALK PHOS: 113 U/L (ref 38–126)
ALT: 23 U/L (ref 14–54)
AST: 27 U/L (ref 15–41)
Albumin: 3.7 g/dL (ref 3.5–5.0)
Anion gap: 8 (ref 5–15)
BUN: 26 mg/dL — ABNORMAL HIGH (ref 6–20)
CALCIUM: 9.2 mg/dL (ref 8.9–10.3)
CO2: 27 mmol/L (ref 22–32)
CREATININE: 1.01 mg/dL — AB (ref 0.44–1.00)
Chloride: 102 mmol/L (ref 101–111)
GFR calc non Af Amer: 50 mL/min — ABNORMAL LOW (ref >60)
GFR, EST AFRICAN AMERICAN: 58 mL/min — AB (ref >60)
GLUCOSE: 118 mg/dL — AB (ref 65–99)
Potassium: 4.3 mmol/L (ref 3.5–5.1)
SODIUM: 137 mmol/L (ref 135–145)
Total Bilirubin: 0.6 mg/dL (ref 0.3–1.2)
Total Protein: 6.7 g/dL (ref 6.5–8.1)

## 2017-04-16 LAB — CBC WITH DIFFERENTIAL/PLATELET
Basophils Absolute: 0 10*3/uL (ref 0–0.1)
Basophils Relative: 1 %
EOS ABS: 0.1 10*3/uL (ref 0–0.7)
Eosinophils Relative: 3 %
HEMATOCRIT: 35.8 % (ref 35.0–47.0)
HEMOGLOBIN: 12.4 g/dL (ref 12.0–16.0)
LYMPHS ABS: 1 10*3/uL (ref 1.0–3.6)
Lymphocytes Relative: 24 %
MCH: 29.7 pg (ref 26.0–34.0)
MCHC: 34.7 g/dL (ref 32.0–36.0)
MCV: 85.6 fL (ref 80.0–100.0)
Monocytes Absolute: 0.5 10*3/uL (ref 0.2–0.9)
Monocytes Relative: 11 %
NEUTROS ABS: 2.6 10*3/uL (ref 1.4–6.5)
Neutrophils Relative %: 61 %
Platelets: 276 10*3/uL (ref 150–440)
RBC: 4.19 MIL/uL (ref 3.80–5.20)
RDW: 15 % — ABNORMAL HIGH (ref 11.5–14.5)
WBC: 4.3 10*3/uL (ref 3.6–11.0)

## 2017-04-16 MED ORDER — HEPARIN SOD (PORK) LOCK FLUSH 100 UNIT/ML IV SOLN: 500.0000 [IU] | Freq: Once | INTRAVENOUS | Status: DC | PRN

## 2017-04-16 MED ORDER — PALONOSETRON HCL INJECTION 0.25 MG/5ML
0.2500 mg | Freq: Once | INTRAVENOUS | Status: AC
  Administered 2017-04-16: 0.25 mg via INTRAVENOUS
  Filled 2017-04-16: qty 5

## 2017-04-16 MED ORDER — SODIUM CHLORIDE 0.9 % IV SOLN
Freq: Once | INTRAVENOUS | Status: AC
  Administered 2017-04-16: 11:00:00 via INTRAVENOUS
  Filled 2017-04-16: qty 1000

## 2017-04-16 MED ORDER — SODIUM CHLORIDE 0.9 % IV SOLN
20.0000 mg | Freq: Once | INTRAVENOUS | Status: AC
  Administered 2017-04-16: 20 mg via INTRAVENOUS
  Filled 2017-04-16: qty 2

## 2017-04-16 MED ORDER — DIPHENHYDRAMINE HCL 50 MG/ML IJ SOLN
25.0000 mg | Freq: Once | INTRAMUSCULAR | Status: AC
  Administered 2017-04-16: 25 mg via INTRAVENOUS
  Filled 2017-04-16: qty 1

## 2017-04-16 MED ORDER — SODIUM CHLORIDE 0.9 % IV SOLN
50.0000 mg/m2 | Freq: Once | INTRAVENOUS | Status: AC
  Administered 2017-04-16: 78 mg via INTRAVENOUS
  Filled 2017-04-16: qty 13

## 2017-04-16 MED ORDER — SODIUM CHLORIDE 0.9 % IV SOLN
127.2000 mg | Freq: Once | INTRAVENOUS | Status: AC
  Administered 2017-04-16: 130 mg via INTRAVENOUS
  Filled 2017-04-16: qty 13

## 2017-04-16 MED ORDER — FAMOTIDINE IN NACL 20-0.9 MG/50ML-% IV SOLN
20.0000 mg | Freq: Once | INTRAVENOUS | Status: AC
  Administered 2017-04-16: 20 mg via INTRAVENOUS
  Filled 2017-04-16: qty 50

## 2017-04-16 NOTE — Progress Notes (Signed)
Patient here for esophageal cancer f/u. She states reports insomnia after her chemotherapy treatment. Requesting to take benadryl 12.5 at bedtime. States that the side effects from premeds from tx keep her awake at night. Reports 1 episode of acid reflux after drinking tomatoe juice for breakfast. Does not desire PPI use at this time.

## 2017-04-16 NOTE — Progress Notes (Signed)
[facility] NOTE  Patient Care Team: Crecencio Mc, MD as PCP - General (Internal Medicine) Clent Jacks, RN as Registered Nurse  CHIEF COMPLAINTS/PURPOSE OF CONSULTATION: Esophageal cancer  #  Oncology History   # MARCH 2018- ADENO CA; mod diff [lower third of esophagus; EGD Bx; Dr.Wohl]; CT- A/P- no distant mets/LN. PET- TxN1; no EUS.  # April 5th 2018- Carbo-taxol with RT  # March 2018- LEFT KIDNEY COMPLEX MASS ~2cm [incidental]; ~ 2 cm left lower lobe nodule [question second primary]  # Hx of Hyponatremia [[phone]; Dr.Lateef]  # MOLECULAR TESTING- Her 2 neu-NEGATIVE; PDL-1- POSITIVE [CPS-2]; MMR- STABLE.      Malignant neoplasm of lower third of esophagus (HCC)     HISTORY OF PRESENTING ILLNESS:  Brittney Jackson 83 y.o.  female diagnosed adenocarcinoma the Lower esophagus currently on chemoradiation is here for follow-up.   Denies any difficulty swallowing. Denies any worsening pain.  Denies any tingling or numbness. No constipation. No nausea or vomiting from chemotherapy.Continues to have burping; not any worse. No weight loss. Patient felt drowsy post infusion at last visit.  ROS: A complete 10 point review of system is done which is negative except mentioned above in history of present illness  MEDICAL HISTORY:  Past Medical History:  Diagnosis Date  . Arthritis    left knee  . Function kidney decreased   . Hypertension   . Hypothyroidism   . Inappropriate ADH secretion (HCC)   . Low sodium levels   . Osteoporosis     SURGICAL HISTORY: Past Surgical History:  Procedure Laterality Date  . CATARACT EXTRACTION W/PHACO Left 05/29/2015   Procedure: CATARACT EXTRACTION PHACO AND INTRAOCULAR LENS PLACEMENT (IOC);  Surgeon: Leandrew Koyanagi, MD;  Location: [facility];  Service: Ophthalmology;  Laterality: Left;  . CATARACT EXTRACTION W/PHACO Right 01/15/2016   Procedure: CATARACT EXTRACTION PHACO AND INTRAOCULAR LENS PLACEMENT  (IOC);  Surgeon: Leandrew Koyanagi, MD;  Location: [facility];  Service: Ophthalmology;  Laterality: Right;  SHUGARCAINE  . ESOPHAGOGASTRODUODENOSCOPY (EGD) WITH PROPOFOL N/A 03/16/2017   Procedure: ESOPHAGOGASTRODUODENOSCOPY (EGD) WITH PROPOFOL;  Surgeon: [name], MD;  Location: [facility];  Service: Endoscopy;  Laterality: N/A;  . 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  Social History   Social History  . Marital status: Widowed    Spouse name: N/A  . Number of children: N/A  . Years of education: N/A   Occupational History  . 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 Smoking status: Former Smoker    Types: Cigarettes    Quit date: 07/21/2000  . Smokeless tobacco: Never Used  . Alcohol use No  . Drug use: No  . Sexual activity: No   Other Topics Concern  . Not on file   Social History Narrative   Church activities, community service.   Lives alone.    FAMILY HISTORY: Family History  Problem Relation Age of Onset  . Heart attack Mother   . Cancer Maternal Uncle     renal cancer   . COPD Neg Hx     no breast, ovarian or colon    ALLERGIES:  is allergic to amlodipine.  MEDICATIONS:  Current Outpatient Prescriptions  Medication Sig Dispense Refill  . cholecalciferol (VITAMIN D) 1000 UNITS tablet Take 1,000 Units by mouth daily.    Marland Kitchen labetalol (NORMODYNE)  300 MG tablet Take 0.5 tablets (150 mg total) by mouth 3 (three) times daily. 1/2 tab (150 mg) Am and PM (Patient taking differently: Take 150 mg by mouth 2 (two) times daily. 1/2 tab (150 mg) Am and PM) 270 tablet 1  . levothyroxine (SYNTHROID, LEVOTHROID) 88 MCG tablet TAKE ONE (1) TABLET EACH DAY 90 tablet 2  . losartan (COZAAR) 100 MG tablet TAKE ONE (1) TABLET BY MOUTH EVERY DAY 90 tablet 1  . Multiple Vitamins-Minerals (PRESERVISION AREDS PO) Take by mouth daily.    .  sucralfate (CARAFATE) 1 g tablet Take 1 tablet (1 g total) by mouth 4 (four) times daily -  with meals and at bedtime. Dissolve in warm water, swish and swallow 90 tablet 3  . aspirin 81 MG tablet Take 81 mg by mouth daily.    . hyoscyamine (LEVSIN SL) 0.125 MG SL tablet Place 1 tablet (0.125 mg total) under the tongue every 4 (four) hours as needed. (Patient not taking: Reported on 03/19/2017) 30 tablet 0  . ondansetron (ZOFRAN ODT) 4 MG disintegrating tablet Allow 1-2 tablets to dissolve in your mouth every 8 hours as needed for nausea/vomiting (Patient not taking: Reported on 03/19/2017) 30 tablet 0  . simvastatin (ZOCOR) 40 MG tablet TAKE ONE (1) TABLET AT BEDTIME (Patient not taking: Reported on 03/19/2017) 90 tablet 1   No current facility-administered medications for this visit.       Marland Kitchen  PHYSICAL EXAMINATION: ECOG PERFORMANCE STATUS: 0 - Asymptomatic  Vitals:   04/16/17 0945  BP: (!) 143/78  Pulse: 81  Resp: 18  Temp: 97.8 F (36.6 C)   Filed Weights   04/16/17 0953  Weight: 131 lb (59.4 kg)    GENERAL: Well-nourished well-developed; Alert, no distress and comfortable.   Accompanied by daughter.  EYES: no pallor or icterus OROPHARYNX: no thrush or ulceration; good dentition  NECK: supple, no masses felt LYMPH:  no palpable lymphadenopathy in the cervical, axillary or inguinal regions LUNGS: clear to auscultation and  No wheeze or crackles HEART/CVS: regular rate & rhythm and no murmurs; No lower extremity edema ABDOMEN: abdomen soft, non-tender and normal bowel sounds Musculoskeletal:no cyanosis of digits and no clubbing  PSYCH: alert & oriented x 3 with fluent speech NEURO: no focal motor/sensory deficits SKIN:  no rashes or significant lesions  LABORATORY DATA:  I have reviewed the data as listed Lab Results  Component Value Date   WBC 4.3 04/16/2017   HGB 12.4 04/16/2017   HCT 35.8 04/16/2017   MCV 85.6 04/16/2017   PLT 276 04/16/2017    Recent Labs   03/14/17 1752 04/01/17 0850 04/09/17 0943 04/16/17 0910  NA 139 137 137 137  K 3.8 4.2 4.3 4.3  CL 106 104 101 102  CO2 30 28 30 27   GLUCOSE 119* 115* 107* 118*  BUN 20 24* 29* 26*  CREATININE 0.91 0.84 0.95 1.01*  CALCIUM 9.4 9.4 9.2 9.2  GFRNONAA 56* >60 53* 50*  GFRAA >60 >60 >60 58*  PROT 7.3 7.0  --  6.7  ALBUMIN 3.9 3.8  --  3.7  AST 25 25  --  27  ALT 17 22  --  23  ALKPHOS 144* 130*  --  113  BILITOT 0.8 0.6  --  0.6    RADIOGRAPHIC STUDIES: I have personally reviewed the radiological images as listed and agreed with the findings in the report. Nm Pet Image Initial (pi) Skull Base To Thigh  Result Date: 03/24/2017 CLINICAL DATA:  Initial treatment strategy for esophageal cancer. EXAM: NUCLEAR MEDICINE PET SKULL BASE TO THIGH TECHNIQUE: 12.6 mCi F-18 FDG was injected intravenously. Full-ring PET imaging was performed from the skull base to thigh after the radiotracer. CT data was obtained and used for attenuation correction and anatomic localization. FASTING BLOOD GLUCOSE:  Value: 103 mg/dl COMPARISON:  Multiple exams, including 03/14/2017 FINDINGS: NECK No hypermetabolic lymph nodes in the neck. Carotid atherosclerotic calcification noted. CHEST Abnormal wall thickening along a segment of the distal esophagus with associated hypermetabolic activity, maximum SUV 8.3. The esophagus proximal to this lesion is mildly dilated. There several small paraesophageal lymph nodes which are not individually recognizable as being hypermetabolic but nevertheless are right in the vicinity of the tumor and accordingly suspicious. For example a small adjacent 0.7 cm in short axis lymph node is present on image 113/3. Peripheral 0.9 by 0.8 cm left lower lobe nodule on image 108/3, maximum SUV 1.5. My impression on review of exams is that this lesion has slowly grown over the last 3 years from a subtle bandlike structure in 2015 for more nodular appearance today, and also has small pulmonary arterial  and pulmonary venous attachments. No other abnormal significant hypermetabolic activity in the chest. Coronary, aortic arch, and branch vessel atherosclerotic vascular disease. Calcified right lower paratracheal lymph node compatible with old granulomatous disease. Biapical pleuroparenchymal scarring. Calcified granuloma in the right middle lobe on image 101 measuring 6 by 4 mm. Bandlike atelectasis or scarring in the right lower lobe, image 111/3. Mild mosaic attenuation in both lungs. ABDOMEN/PELVIS No abnormal hypermetabolic activity within the liver, pancreas, adrenal glands, or spleen. No hypermetabolic lymph nodes in the abdomen or pelvis. Aortoiliac atherosclerotic vascular disease. Sigmoid colon diverticulosis. SKELETON Low-grade hypermetabolic activity through what appears to be a healing fracture of the S5 segment of the sacrum, maximum SUV 3.6. IMPRESSION: 1. Distal esophageal mass maximum SUV 8.3, compatible with malignancy. Although the small adjacent paraesophageal lymph nodes are not discernibly individually hypermetabolic, they may well represent small local nodal involvement. No liver metastatic lesion identified. 2. There is an 8 by 9 mm left lower lobe nodule which appears to have slowly increased in size since 2015 from a original bandlike appearance. However, this nodule is not hypermetabolic, maximum SUV of only 1.5, suggesting a potential benign etiology. There is evidence of old granulomatous disease elsewhere in the chest but this nodule is not calcified. The nodule does have pulmonary arterial and pulmonary venous attachments and accordingly could represent mild enlargement of a pulmonary vascular malformation, but is not entirely specific on today's exam. I would recommend careful surveillance ; CT angiography of the chest may be helpful in assessing for pulmonary arterial phase enhancement that might further favor a vascular malformation. 3. Low-grade hypermetabolic activity associated  with what appears to be a healing nondisplaced transverse fracture of the S5 segment of the sacrum, correlate with any trauma or tenderness in this vicinity. 4. Mild mosaic attenuation in the lungs, potentially from air trapping. 5. Other imaging findings of potential clinical significance: Coronary, aortic arch, and branch vessel atherosclerotic vascular disease. Aortoiliac atherosclerotic vascular disease. Sigmoid colon diverticulosis. Carotid atherosclerotic calcification. Electronically Signed   By: Van Clines M.D.   On: 03/24/2017 11:14    ASSESSMENT & PLAN:   Malignant neoplasm of lower third of esophagus ([place]) # Moderate differentiated adenocarcinoma of the lower esophagus. PET- positive paraesophgeal uptake.Txn1- stage III. On concurrent chemoradiation- carbo-taxol weekly. Tolerated treatment well.  # Proceed with cycle #3 carbotaxol weekly. Labs  today reviewed;  acceptable for treatment today.   # Reaction to pre-meds- cut down- benadryl to 23m; okay wih 12.5 mg qhs prn.   # Weight loss/difficulty swallowing- currently weight stable.  s/p dietary consultation.   # Hx of hyponatremia- sodium- 137; Restrict free water to 1000 ml. we'll check weekly labs.  # follow up in 1 week/ MD/labs/chemo; 2 weeks.      GCammie Sickle MD 04/18/2017 6:34 PM

## 2017-04-16 NOTE — Assessment & Plan Note (Addendum)
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 PET- positive paraesophgeal uptake.Txn1- stage III. On concurrent chemoradiation- carbo-taxol weekly. Tolerated treatment well.  # Proceed with cycle #3 carbotaxol weekly. Labs today reviewed;  acceptable for treatment today.   # Reaction to pre-meds- cut down- benadryl to 25mg ; okay wih 12.5 mg qhs prn.   # Weight loss/difficulty swallowing- currently weight stable.  s/p dietary consultation.   # Hx of hyponatremia- sodium- 137; Restrict free water to 1000 ml. we'll check weekly labs.  # follow up in 1 week/ MD/labs/chemo; 2 weeks.

## 2017-04-19 ENCOUNTER — Ambulatory Visit
Admission: RE | Admit: 2017-04-19 | Discharge: 2017-04-19 | Disposition: A | Discharge status: Home / Self Care | Payer: Medicare Other | Source: Ambulatory Visit | Attending: Radiation Oncology | Admitting: Radiation Oncology

## 2017-04-19 DIAGNOSIS — M129 Arthropathy, unspecified: Secondary | ICD-10-CM | POA: Diagnosis not present

## 2017-04-19 DIAGNOSIS — C155 Malignant neoplasm of lower third of esophagus: Secondary | ICD-10-CM | POA: Diagnosis not present

## 2017-04-19 DIAGNOSIS — E039 Hypothyroidism, unspecified: Secondary | ICD-10-CM | POA: Diagnosis not present

## 2017-04-19 DIAGNOSIS — Z510 Encounter for antineoplastic radiation therapy: Secondary | ICD-10-CM | POA: Diagnosis not present

## 2017-04-19 DIAGNOSIS — Z87891 Personal history of nicotine dependence: Secondary | ICD-10-CM | POA: Diagnosis not present

## 2017-04-19 DIAGNOSIS — N2889 Other specified disorders of kidney and ureter: Secondary | ICD-10-CM | POA: Diagnosis not present

## 2017-04-19 DIAGNOSIS — I10 Essential (primary) hypertension: Secondary | ICD-10-CM | POA: Diagnosis not present

## 2017-04-20 ENCOUNTER — Ambulatory Visit
Admission: RE | Admit: 2017-04-20 | Discharge: 2017-04-20 | Disposition: A | Discharge status: Home / Self Care | Payer: Medicare Other | Source: Ambulatory Visit | Attending: Radiation Oncology | Admitting: Radiation Oncology

## 2017-04-20 DIAGNOSIS — Z87891 Personal history of nicotine dependence: Secondary | ICD-10-CM | POA: Diagnosis not present

## 2017-04-20 DIAGNOSIS — C155 Malignant neoplasm of lower third of esophagus: Secondary | ICD-10-CM | POA: Diagnosis not present

## 2017-04-20 DIAGNOSIS — I10 Essential (primary) hypertension: Secondary | ICD-10-CM | POA: Diagnosis not present

## 2017-04-20 DIAGNOSIS — Z510 Encounter for antineoplastic radiation therapy: Secondary | ICD-10-CM | POA: Diagnosis not present

## 2017-04-20 DIAGNOSIS — N2889 Other specified disorders of kidney and ureter: Secondary | ICD-10-CM | POA: Diagnosis not present

## 2017-04-20 DIAGNOSIS — M129 Arthropathy, unspecified: Secondary | ICD-10-CM | POA: Diagnosis not present

## 2017-04-20 DIAGNOSIS — E039 Hypothyroidism, unspecified: Secondary | ICD-10-CM | POA: Diagnosis not present

## 2017-04-21 ENCOUNTER — Ambulatory Visit
Admission: RE | Admit: 2017-04-21 | Discharge: 2017-04-21 | Disposition: A | Discharge status: Home / Self Care | Payer: Medicare Other | Source: Ambulatory Visit | Attending: Radiation Oncology | Admitting: Radiation Oncology

## 2017-04-21 DIAGNOSIS — E039 Hypothyroidism, unspecified: Secondary | ICD-10-CM | POA: Diagnosis not present

## 2017-04-21 DIAGNOSIS — N2889 Other specified disorders of kidney and ureter: Secondary | ICD-10-CM | POA: Diagnosis not present

## 2017-04-21 DIAGNOSIS — I10 Essential (primary) hypertension: Secondary | ICD-10-CM | POA: Diagnosis not present

## 2017-04-21 DIAGNOSIS — Z510 Encounter for antineoplastic radiation therapy: Secondary | ICD-10-CM | POA: Diagnosis not present

## 2017-04-21 DIAGNOSIS — Z87891 Personal history of nicotine dependence: Secondary | ICD-10-CM | POA: Diagnosis not present

## 2017-04-21 DIAGNOSIS — C155 Malignant neoplasm of lower third of esophagus: Secondary | ICD-10-CM | POA: Diagnosis not present

## 2017-04-21 DIAGNOSIS — M129 Arthropathy, unspecified: Secondary | ICD-10-CM | POA: Diagnosis not present

## 2017-04-22 ENCOUNTER — Ambulatory Visit
Admission: RE | Admit: 2017-04-22 | Discharge: 2017-04-22 | Disposition: A | Discharge status: Home / Self Care | Payer: Medicare Other | Source: Ambulatory Visit | Attending: Radiation Oncology | Admitting: Radiation Oncology

## 2017-04-22 DIAGNOSIS — M129 Arthropathy, unspecified: Secondary | ICD-10-CM | POA: Diagnosis not present

## 2017-04-22 DIAGNOSIS — Z87891 Personal history of nicotine dependence: Secondary | ICD-10-CM | POA: Diagnosis not present

## 2017-04-22 DIAGNOSIS — C155 Malignant neoplasm of lower third of esophagus: Secondary | ICD-10-CM | POA: Diagnosis not present

## 2017-04-22 DIAGNOSIS — Z510 Encounter for antineoplastic radiation therapy: Secondary | ICD-10-CM | POA: Diagnosis not present

## 2017-04-22 DIAGNOSIS — E039 Hypothyroidism, unspecified: Secondary | ICD-10-CM | POA: Diagnosis not present

## 2017-04-22 DIAGNOSIS — N2889 Other specified disorders of kidney and ureter: Secondary | ICD-10-CM | POA: Diagnosis not present

## 2017-04-22 DIAGNOSIS — I10 Essential (primary) hypertension: Secondary | ICD-10-CM | POA: Diagnosis not present

## 2017-04-23 ENCOUNTER — Ambulatory Visit
Admission: RE | Admit: 2017-04-23 | Discharge: 2017-04-23 | Disposition: A | Discharge status: Home / Self Care | Payer: Medicare Other | Source: Ambulatory Visit | Attending: Radiation Oncology | Admitting: Radiation Oncology

## 2017-04-23 ENCOUNTER — Inpatient Hospital Stay: Payer: Medicare Other

## 2017-04-23 ENCOUNTER — Inpatient Hospital Stay (HOSPITAL_BASED_OUTPATIENT_CLINIC_OR_DEPARTMENT_OTHER): Payer: Medicare Other | Admitting: Internal Medicine

## 2017-04-23 VITALS — BP 127/81 | HR 77 | Temp 97.8°F | Resp 16 | Wt 131.5 lb

## 2017-04-23 DIAGNOSIS — R630 Anorexia: Secondary | ICD-10-CM | POA: Diagnosis not present

## 2017-04-23 DIAGNOSIS — Z87891 Personal history of nicotine dependence: Secondary | ICD-10-CM | POA: Diagnosis not present

## 2017-04-23 DIAGNOSIS — K449 Diaphragmatic hernia without obstruction or gangrene: Secondary | ICD-10-CM | POA: Diagnosis not present

## 2017-04-23 DIAGNOSIS — Z79899 Other long term (current) drug therapy: Secondary | ICD-10-CM

## 2017-04-23 DIAGNOSIS — R1310 Dysphagia, unspecified: Secondary | ICD-10-CM

## 2017-04-23 DIAGNOSIS — M818 Other osteoporosis without current pathological fracture: Secondary | ICD-10-CM

## 2017-04-23 DIAGNOSIS — E222 Syndrome of inappropriate secretion of antidiuretic hormone: Secondary | ICD-10-CM

## 2017-04-23 DIAGNOSIS — E871 Hypo-osmolality and hyponatremia: Secondary | ICD-10-CM

## 2017-04-23 DIAGNOSIS — M129 Arthropathy, unspecified: Secondary | ICD-10-CM | POA: Diagnosis not present

## 2017-04-23 DIAGNOSIS — N2889 Other specified disorders of kidney and ureter: Secondary | ICD-10-CM | POA: Diagnosis not present

## 2017-04-23 DIAGNOSIS — C155 Malignant neoplasm of lower third of esophagus: Secondary | ICD-10-CM

## 2017-04-23 DIAGNOSIS — E039 Hypothyroidism, unspecified: Secondary | ICD-10-CM

## 2017-04-23 DIAGNOSIS — I700 Atherosclerosis of aorta: Secondary | ICD-10-CM

## 2017-04-23 DIAGNOSIS — R911 Solitary pulmonary nodule: Secondary | ICD-10-CM | POA: Diagnosis not present

## 2017-04-23 DIAGNOSIS — I10 Essential (primary) hypertension: Secondary | ICD-10-CM

## 2017-04-23 DIAGNOSIS — R634 Abnormal weight loss: Secondary | ICD-10-CM

## 2017-04-23 DIAGNOSIS — K760 Fatty (change of) liver, not elsewhere classified: Secondary | ICD-10-CM

## 2017-04-23 DIAGNOSIS — Z510 Encounter for antineoplastic radiation therapy: Secondary | ICD-10-CM | POA: Diagnosis not present

## 2017-04-23 DIAGNOSIS — Z5111 Encounter for antineoplastic chemotherapy: Secondary | ICD-10-CM | POA: Diagnosis not present

## 2017-04-23 DIAGNOSIS — Z8051 Family history of malignant neoplasm of kidney: Secondary | ICD-10-CM

## 2017-04-23 DIAGNOSIS — Z7982 Long term (current) use of aspirin: Secondary | ICD-10-CM

## 2017-04-23 LAB — CBC WITH DIFFERENTIAL/PLATELET
BASOS ABS: 0 10*3/uL (ref 0–0.1)
Basophils Relative: 1 %
Eosinophils Absolute: 0.1 10*3/uL (ref 0–0.7)
Eosinophils Relative: 1 %
HEMATOCRIT: 34.2 % — AB (ref 35.0–47.0)
HEMOGLOBIN: 12 g/dL (ref 12.0–16.0)
LYMPHS PCT: 17 %
Lymphs Abs: 0.7 10*3/uL — ABNORMAL LOW (ref 1.0–3.6)
MCH: 29.9 pg (ref 26.0–34.0)
MCHC: 35 g/dL (ref 32.0–36.0)
MCV: 85.5 fL (ref 80.0–100.0)
MONO ABS: 0.6 10*3/uL (ref 0.2–0.9)
Monocytes Relative: 14 %
NEUTROS ABS: 2.9 10*3/uL (ref 1.4–6.5)
NEUTROS PCT: 67 %
Platelets: 225 10*3/uL (ref 150–440)
RBC: 4 MIL/uL (ref 3.80–5.20)
RDW: 15.4 % — AB (ref 11.5–14.5)
WBC: 4.4 10*3/uL (ref 3.6–11.0)

## 2017-04-23 LAB — COMPREHENSIVE METABOLIC PANEL
ALT: 22 U/L (ref 14–54)
AST: 26 U/L (ref 15–41)
Albumin: 3.7 g/dL (ref 3.5–5.0)
Alkaline Phosphatase: 111 U/L (ref 38–126)
Anion gap: 4 — ABNORMAL LOW (ref 5–15)
BILIRUBIN TOTAL: 0.6 mg/dL (ref 0.3–1.2)
BUN: 24 mg/dL — AB (ref 6–20)
CO2: 29 mmol/L (ref 22–32)
Calcium: 9.1 mg/dL (ref 8.9–10.3)
Chloride: 104 mmol/L (ref 101–111)
Creatinine, Ser: 1.1 mg/dL — ABNORMAL HIGH (ref 0.44–1.00)
GFR calc Af Amer: 52 mL/min — ABNORMAL LOW (ref >60)
GFR calc non Af Amer: 45 mL/min — ABNORMAL LOW (ref >60)
GLUCOSE: 124 mg/dL — AB (ref 65–99)
POTASSIUM: 4.1 mmol/L (ref 3.5–5.1)
Sodium: 137 mmol/L (ref 135–145)
TOTAL PROTEIN: 6.7 g/dL (ref 6.5–8.1)

## 2017-04-23 MED ORDER — SODIUM CHLORIDE 0.9 % IV SOLN
20.0000 mg | Freq: Once | INTRAVENOUS | Status: AC
  Administered 2017-04-23: 20 mg via INTRAVENOUS
  Filled 2017-04-23: qty 2

## 2017-04-23 MED ORDER — SODIUM CHLORIDE 0.9 % IV SOLN
130.0000 mg | Freq: Once | INTRAVENOUS | Status: AC
  Administered 2017-04-23: 130 mg via INTRAVENOUS
  Filled 2017-04-23: qty 13

## 2017-04-23 MED ORDER — SODIUM CHLORIDE 0.9 % IV SOLN
Freq: Once | INTRAVENOUS | Status: AC
  Administered 2017-04-23: 10:00:00 via INTRAVENOUS
  Filled 2017-04-23: qty 1000

## 2017-04-23 MED ORDER — DIPHENHYDRAMINE HCL 50 MG/ML IJ SOLN
25.0000 mg | Freq: Once | INTRAMUSCULAR | Status: AC
  Administered 2017-04-23: 25 mg via INTRAVENOUS
  Filled 2017-04-23: qty 1

## 2017-04-23 MED ORDER — FAMOTIDINE IN NACL 20-0.9 MG/50ML-% IV SOLN
20.0000 mg | Freq: Once | INTRAVENOUS | Status: AC
  Administered 2017-04-23: 20 mg via INTRAVENOUS
  Filled 2017-04-23: qty 50

## 2017-04-23 MED ORDER — PACLITAXEL CHEMO INJECTION 300 MG/50ML
50.0000 mg/m2 | Freq: Once | INTRAVENOUS | Status: AC
  Administered 2017-04-23: 78 mg via INTRAVENOUS
  Filled 2017-04-23: qty 13

## 2017-04-23 MED ORDER — PALONOSETRON HCL INJECTION 0.25 MG/5ML
0.2500 mg | Freq: Once | INTRAVENOUS | Status: AC
  Administered 2017-04-23: 0.25 mg via INTRAVENOUS
  Filled 2017-04-23: qty 5

## 2017-04-23 NOTE — Progress Notes (Signed)
[facility] NOTE  Patient Care Team: Crecencio Mc, MD as PCP - General (Internal Medicine) Clent Jacks, RN as Registered Nurse  CHIEF COMPLAINTS/PURPOSE OF CONSULTATION: Esophageal cancer  #  Oncology History   # MARCH 2018- ADENO CA; mod diff [lower third of esophagus; EGD Bx; Dr.Wohl]; CT- A/P- no distant mets/LN. PET- TxN1; no EUS.  # April 5th 2018- Carbo-taxol with RT  # March 2018- LEFT KIDNEY COMPLEX MASS ~2cm [incidental]; ~ 2 cm left lower lobe nodule [question second primary]  # Hx of Hyponatremia [[phone]; Dr.Lateef]  # MOLECULAR TESTING- Her 2 neu-NEGATIVE; PDL-1- POSITIVE [CPS-2]; MMR- STABLE.      Malignant neoplasm of lower third of esophagus (HCC)     HISTORY OF PRESENTING ILLNESS:  Brittney Jackson 83 y.o.  female diagnosed adenocarcinoma the Lower esophagus currently on chemoradiation is here for follow-up.   Patient continues to have burping; although denies any worsening reflux. She continues to have difficulty swallowing to certain foods like bread. However this has not gotten any worse.  Denies any tingling or numbness. No constipation. No nausea or vomiting from chemotherapy.No weight loss-stable at 131 pounds.  ROS: A complete 10 point review of system is done which is negative except mentioned above in history of present illness  MEDICAL HISTORY:  Past Medical History:  Diagnosis Date  . Arthritis    left knee  . Function kidney decreased   . Hypertension   . Hypothyroidism   . Inappropriate ADH secretion (HCC)   . Low sodium levels   . Osteoporosis     SURGICAL HISTORY: Past Surgical History:  Procedure Laterality Date  . CATARACT EXTRACTION W/PHACO Left 05/29/2015   Procedure: CATARACT EXTRACTION PHACO AND INTRAOCULAR LENS PLACEMENT (IOC);  Surgeon: Leandrew Koyanagi, MD;  Location: [facility];  Service: Ophthalmology;  Laterality: Left;  . CATARACT EXTRACTION W/PHACO Right 01/15/2016   Procedure:  CATARACT EXTRACTION PHACO AND INTRAOCULAR LENS PLACEMENT (IOC);  Surgeon: Leandrew Koyanagi, MD;  Location: [facility];  Service: Ophthalmology;  Laterality: Right;  SHUGARCAINE  . ESOPHAGOGASTRODUODENOSCOPY (EGD) WITH PROPOFOL N/A 03/16/2017   Procedure: ESOPHAGOGASTRODUODENOSCOPY (EGD) WITH PROPOFOL;  Surgeon: [name], MD;  Location: [facility];  Service: Endoscopy;  Laterality: N/A;  . 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  Social History   Social History  . Marital status: Widowed    Spouse name: N/A  . Number of children: N/A  . Years of education: N/A   Occupational History  . 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 Smoking status: Former Smoker    Types: Cigarettes    Quit date: 07/21/2000  . Smokeless tobacco: Never Used  . Alcohol use No  . Drug use: No  . Sexual activity: No   Other Topics Concern  . Not on file   Social History Narrative   Church activities, community service.   Lives alone.    FAMILY HISTORY: Family History  Problem Relation Age of Onset  . Heart attack Mother   . Cancer Maternal Uncle     renal cancer   . COPD Neg Hx     no breast, ovarian or colon    ALLERGIES:  is allergic to amlodipine.  MEDICATIONS:  Current Outpatient Prescriptions  Medication Sig Dispense Refill  . labetalol (NORMODYNE) 300 MG tablet Take 0.5 tablets (150 mg  total) by mouth 3 (three) times daily. 1/2 tab (150 mg) Am and PM (Patient taking differently: Take 150 mg by mouth 2 (two) times daily. 1/2 tab (150 mg) Am and PM) 270 tablet 1  . levothyroxine (SYNTHROID, LEVOTHROID) 88 MCG tablet TAKE ONE (1) TABLET EACH DAY 90 tablet 2  . losartan (COZAAR) 100 MG tablet TAKE ONE (1) TABLET BY MOUTH EVERY DAY 90 tablet 1  . sucralfate (CARAFATE) 1 g tablet Take 1 tablet (1 g total) by mouth 4 (four) times daily -  with meals  and at bedtime. Dissolve in warm water, swish and swallow 90 tablet 3  . aspirin 81 MG tablet Take 81 mg by mouth daily.    . cholecalciferol (VITAMIN D) 1000 UNITS tablet Take 1,000 Units by mouth daily.    . Multiple Vitamins-Minerals (PRESERVISION AREDS PO) Take by mouth daily.    . ondansetron (ZOFRAN ODT) 4 MG disintegrating tablet Allow 1-2 tablets to dissolve in your mouth every 8 hours as needed for nausea/vomiting (Patient not taking: Reported on 03/19/2017) 30 tablet 0  . simvastatin (ZOCOR) 40 MG tablet TAKE ONE (1) TABLET AT BEDTIME (Patient not taking: Reported on 03/19/2017) 90 tablet 1   No current facility-administered medications for this visit.       Marland Kitchen  PHYSICAL EXAMINATION: ECOG PERFORMANCE STATUS: 0 - Asymptomatic  Vitals:   04/23/17 0908  BP: 127/81  Pulse: 77  Resp: 16  Temp: 97.8 F (36.6 C)   Filed Weights   04/23/17 0908  Weight: 131 lb 8 oz (59.6 kg)    GENERAL: Well-nourished well-developed; Alert, no distress and comfortable.   Accompanied byAnother daughter.  EYES: no pallor or icterus OROPHARYNX: no thrush or ulceration; good dentition  NECK: supple, no masses felt LYMPH:  no palpable lymphadenopathy in the cervical, axillary or inguinal regions LUNGS: clear to auscultation and  No wheeze or crackles HEART/CVS: regular rate & rhythm and no murmurs; No lower extremity edema ABDOMEN: abdomen soft, non-tender and normal bowel sounds Musculoskeletal:no cyanosis of digits and no clubbing  PSYCH: alert & oriented x 3 with fluent speech NEURO: no focal motor/sensory deficits SKIN:  no rashes or significant lesions  LABORATORY DATA:  I have reviewed the data as listed Lab Results  Component Value Date   WBC 4.4 04/23/2017   HGB 12.0 04/23/2017   HCT 34.2 (L) 04/23/2017   MCV 85.5 04/23/2017   PLT 225 04/23/2017    Recent Labs  04/01/17 0850 04/09/17 0943 04/16/17 0910 04/23/17 0818  NA 137 137 137 137  K 4.2 4.3 4.3 4.1  CL 104 101 102  104  CO2 _0 GLUCOSE 115* 107* 118* 124*  BUN 24* 29* 26* 24*  CREATININE 0.84 0.95 1.01* 1.10*  CALCIUM 9.4 9.2 9.2 9.1  GFRNONAA >60 53* 50* 45*  GFRAA >60 >60 58* 52*  PROT 7.0  --  6.7 6.7  ALBUMIN 3.8  --  3.7 3.7  AST 25  --  27 26  ALT 22  --  23 22  ALKPHOS 130*  --  113 111  BILITOT 0.6  --  0.6 0.6    RADIOGRAPHIC STUDIES: I have personally reviewed the radiological images as listed and agreed with the findings in the report. No results found.  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 PET- positive paraesophgeal uptake.Txn1- stage III. On concurrent chemoradiation- carbo-taxol weekly. Tolerated treatment  well.  # Proceed with cycle #4 carbotaxol weekly. Labs today reviewed;  acceptable for treatment today. Discussed adding carbo-taxol on 5/18 if tolerating well [RT until- 5/22]  # I discussed with the patient and her daughter- the serious diagnosis of esophageal cancer. Given her age patient is not a candidate for a major invasive surgeries like esophagectomy. It is very unlikely that patient could be cured with chemoradiation alone- however 10-20% chance of long-term remission is possible. They do understand that systemic treatment options including immunotherapy are available; if and when she progresses future.  # Weight loss/difficulty swallowing- not any  Worse/ stable at 131 pounds; recommend carafate in needed. currently weight stable.  s/p dietary consultation.   # Hx of hyponatremia- sodium- 137; Restrict free water to 1000 ml. we'll continue to check weekly labs.  # follow up in 1 week/ /labs/chemo; 2 weeks/labs-chemo/MD.      Cammie Sickle, MD 04/24/2017 10:37 AM

## 2017-04-23 NOTE — Assessment & Plan Note (Addendum)
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 PET- positive paraesophgeal uptake.Txn1- stage III. On concurrent chemoradiation- carbo-taxol weekly. Tolerated treatment well.  # Proceed with cycle #4 carbotaxol weekly. Labs today reviewed;  acceptable for treatment today. Discussed adding carbo-taxol on 5/18 if tolerating well [RT until- 5/22]  # I discussed with the patient and her daughter- the serious diagnosis of esophageal cancer. Given her age patient is not a candidate for a major invasive surgeries like esophagectomy. It is very unlikely that patient could be cured with chemoradiation alone- however 10-20% chance of long-term remission is possible. They do understand that systemic treatment options including immunotherapy are available; if and when she progresses future.  # Weight loss/difficulty swallowing- not any  Worse/ stable at 131 pounds; recommend carafate in needed. currently weight stable.  s/p dietary consultation.   # Hx of hyponatremia- sodium- 137; Restrict free water to 1000 ml. we'll continue to check weekly labs.  # follow up in 1 week/ /labs/chemo; 2 weeks/labs-chemo/MD.

## 2017-04-23 NOTE — Progress Notes (Signed)
Patient here today for follow up.   

## 2017-04-26 ENCOUNTER — Ambulatory Visit
Admission: RE | Admit: 2017-04-26 | Discharge: 2017-04-26 | Disposition: A | Discharge status: Home / Self Care | Payer: Medicare Other | Source: Ambulatory Visit | Attending: Radiation Oncology | Admitting: Radiation Oncology

## 2017-04-26 DIAGNOSIS — C155 Malignant neoplasm of lower third of esophagus: Secondary | ICD-10-CM | POA: Diagnosis not present

## 2017-04-26 DIAGNOSIS — E039 Hypothyroidism, unspecified: Secondary | ICD-10-CM | POA: Diagnosis not present

## 2017-04-26 DIAGNOSIS — Z87891 Personal history of nicotine dependence: Secondary | ICD-10-CM | POA: Diagnosis not present

## 2017-04-26 DIAGNOSIS — Z510 Encounter for antineoplastic radiation therapy: Secondary | ICD-10-CM | POA: Diagnosis not present

## 2017-04-26 DIAGNOSIS — M129 Arthropathy, unspecified: Secondary | ICD-10-CM | POA: Diagnosis not present

## 2017-04-26 DIAGNOSIS — I10 Essential (primary) hypertension: Secondary | ICD-10-CM | POA: Diagnosis not present

## 2017-04-26 DIAGNOSIS — N2889 Other specified disorders of kidney and ureter: Secondary | ICD-10-CM | POA: Diagnosis not present

## 2017-04-27 ENCOUNTER — Ambulatory Visit
Admission: RE | Admit: 2017-04-27 | Discharge: 2017-04-27 | Disposition: A | Discharge status: Home / Self Care | Payer: Medicare Other | Source: Ambulatory Visit | Attending: Radiation Oncology | Admitting: Radiation Oncology

## 2017-04-27 DIAGNOSIS — Z510 Encounter for antineoplastic radiation therapy: Secondary | ICD-10-CM | POA: Diagnosis not present

## 2017-04-27 DIAGNOSIS — I10 Essential (primary) hypertension: Secondary | ICD-10-CM | POA: Diagnosis not present

## 2017-04-27 DIAGNOSIS — N2889 Other specified disorders of kidney and ureter: Secondary | ICD-10-CM | POA: Diagnosis not present

## 2017-04-27 DIAGNOSIS — Z87891 Personal history of nicotine dependence: Secondary | ICD-10-CM | POA: Diagnosis not present

## 2017-04-27 DIAGNOSIS — C155 Malignant neoplasm of lower third of esophagus: Secondary | ICD-10-CM | POA: Diagnosis not present

## 2017-04-27 DIAGNOSIS — M129 Arthropathy, unspecified: Secondary | ICD-10-CM | POA: Diagnosis not present

## 2017-04-27 DIAGNOSIS — E039 Hypothyroidism, unspecified: Secondary | ICD-10-CM | POA: Diagnosis not present

## 2017-04-28 ENCOUNTER — Ambulatory Visit
Admission: RE | Admit: 2017-04-28 | Discharge: 2017-04-28 | Disposition: A | Discharge status: Home / Self Care | Payer: Medicare Other | Source: Ambulatory Visit | Attending: Radiation Oncology | Admitting: Radiation Oncology

## 2017-04-28 DIAGNOSIS — Z87891 Personal history of nicotine dependence: Secondary | ICD-10-CM | POA: Diagnosis not present

## 2017-04-28 DIAGNOSIS — E039 Hypothyroidism, unspecified: Secondary | ICD-10-CM | POA: Diagnosis not present

## 2017-04-28 DIAGNOSIS — C155 Malignant neoplasm of lower third of esophagus: Secondary | ICD-10-CM | POA: Diagnosis not present

## 2017-04-28 DIAGNOSIS — I10 Essential (primary) hypertension: Secondary | ICD-10-CM | POA: Diagnosis not present

## 2017-04-28 DIAGNOSIS — M129 Arthropathy, unspecified: Secondary | ICD-10-CM | POA: Diagnosis not present

## 2017-04-28 DIAGNOSIS — N2889 Other specified disorders of kidney and ureter: Secondary | ICD-10-CM | POA: Diagnosis not present

## 2017-04-28 DIAGNOSIS — Z510 Encounter for antineoplastic radiation therapy: Secondary | ICD-10-CM | POA: Diagnosis not present

## 2017-04-29 ENCOUNTER — Ambulatory Visit
Admission: RE | Admit: 2017-04-29 | Discharge: 2017-04-29 | Disposition: A | Discharge status: Home / Self Care | Payer: Medicare Other | Source: Ambulatory Visit | Attending: Radiation Oncology | Admitting: Radiation Oncology

## 2017-04-29 DIAGNOSIS — Z510 Encounter for antineoplastic radiation therapy: Secondary | ICD-10-CM | POA: Diagnosis not present

## 2017-04-29 DIAGNOSIS — C155 Malignant neoplasm of lower third of esophagus: Secondary | ICD-10-CM | POA: Diagnosis not present

## 2017-04-29 DIAGNOSIS — E039 Hypothyroidism, unspecified: Secondary | ICD-10-CM | POA: Diagnosis not present

## 2017-04-29 DIAGNOSIS — M129 Arthropathy, unspecified: Secondary | ICD-10-CM | POA: Diagnosis not present

## 2017-04-29 DIAGNOSIS — N2889 Other specified disorders of kidney and ureter: Secondary | ICD-10-CM | POA: Diagnosis not present

## 2017-04-29 DIAGNOSIS — Z87891 Personal history of nicotine dependence: Secondary | ICD-10-CM | POA: Diagnosis not present

## 2017-04-29 DIAGNOSIS — I10 Essential (primary) hypertension: Secondary | ICD-10-CM | POA: Diagnosis not present

## 2017-04-30 ENCOUNTER — Ambulatory Visit: Payer: Medicare Other | Admitting: Internal Medicine

## 2017-04-30 ENCOUNTER — Ambulatory Visit
Admission: RE | Admit: 2017-04-30 | Discharge: 2017-04-30 | Disposition: A | Discharge status: Home / Self Care | Payer: Medicare Other | Source: Ambulatory Visit | Attending: Radiation Oncology | Admitting: Radiation Oncology

## 2017-04-30 ENCOUNTER — Inpatient Hospital Stay: Payer: Medicare Other | Attending: Internal Medicine

## 2017-04-30 ENCOUNTER — Inpatient Hospital Stay: Payer: Medicare Other

## 2017-04-30 ENCOUNTER — Other Ambulatory Visit: Payer: Self-pay

## 2017-04-30 VITALS — BP 113/66 | HR 73 | Temp 98.7°F | Resp 18

## 2017-04-30 DIAGNOSIS — N2889 Other specified disorders of kidney and ureter: Secondary | ICD-10-CM | POA: Insufficient documentation

## 2017-04-30 DIAGNOSIS — C155 Malignant neoplasm of lower third of esophagus: Secondary | ICD-10-CM

## 2017-04-30 DIAGNOSIS — Z8051 Family history of malignant neoplasm of kidney: Secondary | ICD-10-CM | POA: Diagnosis not present

## 2017-04-30 DIAGNOSIS — Z87891 Personal history of nicotine dependence: Secondary | ICD-10-CM | POA: Diagnosis not present

## 2017-04-30 DIAGNOSIS — E871 Hypo-osmolality and hyponatremia: Secondary | ICD-10-CM | POA: Insufficient documentation

## 2017-04-30 DIAGNOSIS — R1310 Dysphagia, unspecified: Secondary | ICD-10-CM | POA: Diagnosis not present

## 2017-04-30 DIAGNOSIS — E039 Hypothyroidism, unspecified: Secondary | ICD-10-CM | POA: Insufficient documentation

## 2017-04-30 DIAGNOSIS — M129 Arthropathy, unspecified: Secondary | ICD-10-CM | POA: Diagnosis not present

## 2017-04-30 DIAGNOSIS — E222 Syndrome of inappropriate secretion of antidiuretic hormone: Secondary | ICD-10-CM | POA: Diagnosis not present

## 2017-04-30 DIAGNOSIS — R634 Abnormal weight loss: Secondary | ICD-10-CM | POA: Insufficient documentation

## 2017-04-30 DIAGNOSIS — M818 Other osteoporosis without current pathological fracture: Secondary | ICD-10-CM | POA: Insufficient documentation

## 2017-04-30 DIAGNOSIS — Z5111 Encounter for antineoplastic chemotherapy: Secondary | ICD-10-CM | POA: Diagnosis not present

## 2017-04-30 DIAGNOSIS — Z79899 Other long term (current) drug therapy: Secondary | ICD-10-CM | POA: Insufficient documentation

## 2017-04-30 DIAGNOSIS — I10 Essential (primary) hypertension: Secondary | ICD-10-CM | POA: Insufficient documentation

## 2017-04-30 DIAGNOSIS — Z510 Encounter for antineoplastic radiation therapy: Secondary | ICD-10-CM | POA: Diagnosis not present

## 2017-04-30 DIAGNOSIS — Z7982 Long term (current) use of aspirin: Secondary | ICD-10-CM | POA: Diagnosis not present

## 2017-04-30 LAB — CBC WITH DIFFERENTIAL/PLATELET
Basophils Absolute: 0 10*3/uL (ref 0–0.1)
Basophils Relative: 1 %
EOS ABS: 0.1 10*3/uL (ref 0–0.7)
Eosinophils Relative: 2 %
HCT: 32.6 % — ABNORMAL LOW (ref 35.0–47.0)
HEMOGLOBIN: 11.3 g/dL — AB (ref 12.0–16.0)
LYMPHS ABS: 0.6 10*3/uL — AB (ref 1.0–3.6)
LYMPHS PCT: 20 %
MCH: 30.1 pg (ref 26.0–34.0)
MCHC: 34.6 g/dL (ref 32.0–36.0)
MCV: 86.9 fL (ref 80.0–100.0)
Monocytes Absolute: 0.4 10*3/uL (ref 0.2–0.9)
Monocytes Relative: 15 %
NEUTROS PCT: 62 %
Neutro Abs: 1.8 10*3/uL (ref 1.4–6.5)
Platelets: 167 10*3/uL (ref 150–440)
RBC: 3.75 MIL/uL — AB (ref 3.80–5.20)
RDW: 15.5 % — ABNORMAL HIGH (ref 11.5–14.5)
WBC: 2.9 10*3/uL — AB (ref 3.6–11.0)

## 2017-04-30 LAB — COMPREHENSIVE METABOLIC PANEL
ALK PHOS: 106 U/L (ref 38–126)
ALT: 19 U/L (ref 14–54)
AST: 25 U/L (ref 15–41)
Albumin: 3.4 g/dL — ABNORMAL LOW (ref 3.5–5.0)
Anion gap: 6 (ref 5–15)
BUN: 22 mg/dL — AB (ref 6–20)
CALCIUM: 9.2 mg/dL (ref 8.9–10.3)
CO2: 29 mmol/L (ref 22–32)
CREATININE: 0.92 mg/dL (ref 0.44–1.00)
Chloride: 100 mmol/L — ABNORMAL LOW (ref 101–111)
GFR calc non Af Amer: 56 mL/min — ABNORMAL LOW (ref >60)
Glucose, Bld: 122 mg/dL — ABNORMAL HIGH (ref 65–99)
Potassium: 3.9 mmol/L (ref 3.5–5.1)
SODIUM: 135 mmol/L (ref 135–145)
Total Bilirubin: 0.4 mg/dL (ref 0.3–1.2)
Total Protein: 6.4 g/dL — ABNORMAL LOW (ref 6.5–8.1)

## 2017-04-30 MED ORDER — CARBOPLATIN CHEMO INJECTION 450 MG/45ML
130.0000 mg | Freq: Once | INTRAVENOUS | Status: AC
  Administered 2017-04-30: 130 mg via INTRAVENOUS
  Filled 2017-04-30: qty 13

## 2017-04-30 MED ORDER — PALONOSETRON HCL INJECTION 0.25 MG/5ML
0.2500 mg | Freq: Once | INTRAVENOUS | Status: AC
  Administered 2017-04-30: 0.25 mg via INTRAVENOUS
  Filled 2017-04-30: qty 5

## 2017-04-30 MED ORDER — DIPHENHYDRAMINE HCL 50 MG/ML IJ SOLN
25.0000 mg | Freq: Once | INTRAMUSCULAR | Status: AC
  Administered 2017-04-30: 25 mg via INTRAVENOUS
  Filled 2017-04-30: qty 1

## 2017-04-30 MED ORDER — SODIUM CHLORIDE 0.9 % IV SOLN
Freq: Once | INTRAVENOUS | Status: AC
  Administered 2017-04-30: 11:00:00 via INTRAVENOUS
  Filled 2017-04-30: qty 1000

## 2017-04-30 MED ORDER — FAMOTIDINE IN NACL 20-0.9 MG/50ML-% IV SOLN
20.0000 mg | Freq: Once | INTRAVENOUS | Status: AC
  Administered 2017-04-30: 20 mg via INTRAVENOUS
  Filled 2017-04-30: qty 50

## 2017-04-30 MED ORDER — SODIUM CHLORIDE 0.9 % IV SOLN
20.0000 mg | Freq: Once | INTRAVENOUS | Status: AC
  Administered 2017-04-30: 20 mg via INTRAVENOUS
  Filled 2017-04-30: qty 2

## 2017-04-30 MED ORDER — PACLITAXEL CHEMO INJECTION 300 MG/50ML
50.0000 mg/m2 | Freq: Once | INTRAVENOUS | Status: AC
  Administered 2017-04-30: 78 mg via INTRAVENOUS
  Filled 2017-04-30: qty 13

## 2017-05-03 ENCOUNTER — Inpatient Hospital Stay: Payer: Medicare Other

## 2017-05-03 ENCOUNTER — Telehealth: Payer: Self-pay

## 2017-05-03 ENCOUNTER — Ambulatory Visit
Admission: RE | Admit: 2017-05-03 | Discharge: 2017-05-03 | Disposition: A | Discharge status: Home / Self Care | Payer: Medicare Other | Source: Ambulatory Visit | Attending: Radiation Oncology | Admitting: Radiation Oncology

## 2017-05-03 DIAGNOSIS — N2889 Other specified disorders of kidney and ureter: Secondary | ICD-10-CM | POA: Diagnosis not present

## 2017-05-03 DIAGNOSIS — M129 Arthropathy, unspecified: Secondary | ICD-10-CM | POA: Diagnosis not present

## 2017-05-03 DIAGNOSIS — I10 Essential (primary) hypertension: Secondary | ICD-10-CM | POA: Diagnosis not present

## 2017-05-03 DIAGNOSIS — C155 Malignant neoplasm of lower third of esophagus: Secondary | ICD-10-CM | POA: Diagnosis not present

## 2017-05-03 DIAGNOSIS — E039 Hypothyroidism, unspecified: Secondary | ICD-10-CM | POA: Diagnosis not present

## 2017-05-03 DIAGNOSIS — Z87891 Personal history of nicotine dependence: Secondary | ICD-10-CM | POA: Diagnosis not present

## 2017-05-03 DIAGNOSIS — Z510 Encounter for antineoplastic radiation therapy: Secondary | ICD-10-CM | POA: Diagnosis not present

## 2017-05-03 NOTE — Telephone Encounter (Signed)
Nutrition Follow-up:  Patient came early to radiation therapy today and was not able to meet with her in person.  Spoke with patient via phone and reports she is balancing out her nutrition and weight is stable.  Patient reports thinks she can hold a little bit more than before still eating frequent meals.  Reports there is no typical day as far as intake is concerned.  Often eats oatmeal or cereal or egg and bacon or pancake for breakfast. Loves panera broccoli and cheddar soup.  Is able to tolerate chicken and fish better than red meat.  Often drinks Boost Very High Calorie oral nutrition supplement at least 1 time per day or more.  Also has ensure plus and boost plus available to drink to provide additional calories and protein.    No issues with nausea, diarrhea, constipation, or sore mouth.     Medications: reviewed  Labs: reviewed  Anthropometrics:   Noted weight on 4/27 of 131 lb 8 oz, stable from 132 lb on 4/17   NUTRITION DIAGNOSIS: Malnutrition severe stable   MALNUTRITION DIAGNOSIS: Severe malnutrition stable    INTERVENTION:   Encouraged patient to continue with consuming high calorie, high protein foods.  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   Patient will continue to consume oral nutrition supplements as tolerated.    MONITORING, EVALUATION, GOAL: Patient will consume high calorie, high protein foods to prevent weight loss   NEXT VISIT: as needed  Jamiee Milholland B. Zenia Resides, [place], [place] Registered Dietitian [phone] (pager)

## 2017-05-04 ENCOUNTER — Ambulatory Visit
Admission: RE | Admit: 2017-05-04 | Discharge: 2017-05-04 | Disposition: A | Discharge status: Home / Self Care | Payer: Medicare Other | Source: Ambulatory Visit | Attending: Radiation Oncology | Admitting: Radiation Oncology

## 2017-05-04 DIAGNOSIS — Z87891 Personal history of nicotine dependence: Secondary | ICD-10-CM | POA: Diagnosis not present

## 2017-05-04 DIAGNOSIS — C155 Malignant neoplasm of lower third of esophagus: Secondary | ICD-10-CM | POA: Diagnosis not present

## 2017-05-04 DIAGNOSIS — N2889 Other specified disorders of kidney and ureter: Secondary | ICD-10-CM | POA: Diagnosis not present

## 2017-05-04 DIAGNOSIS — Z510 Encounter for antineoplastic radiation therapy: Secondary | ICD-10-CM | POA: Diagnosis not present

## 2017-05-04 DIAGNOSIS — M129 Arthropathy, unspecified: Secondary | ICD-10-CM | POA: Diagnosis not present

## 2017-05-04 DIAGNOSIS — I10 Essential (primary) hypertension: Secondary | ICD-10-CM | POA: Diagnosis not present

## 2017-05-04 DIAGNOSIS — E039 Hypothyroidism, unspecified: Secondary | ICD-10-CM | POA: Diagnosis not present

## 2017-05-05 ENCOUNTER — Ambulatory Visit
Admission: RE | Admit: 2017-05-05 | Discharge: 2017-05-05 | Disposition: A | Discharge status: Home / Self Care | Payer: Medicare Other | Source: Ambulatory Visit | Attending: Radiation Oncology | Admitting: Radiation Oncology

## 2017-05-05 DIAGNOSIS — Z510 Encounter for antineoplastic radiation therapy: Secondary | ICD-10-CM | POA: Diagnosis not present

## 2017-05-05 DIAGNOSIS — C155 Malignant neoplasm of lower third of esophagus: Secondary | ICD-10-CM | POA: Diagnosis not present

## 2017-05-05 DIAGNOSIS — Z87891 Personal history of nicotine dependence: Secondary | ICD-10-CM | POA: Diagnosis not present

## 2017-05-05 DIAGNOSIS — M129 Arthropathy, unspecified: Secondary | ICD-10-CM | POA: Diagnosis not present

## 2017-05-05 DIAGNOSIS — N2889 Other specified disorders of kidney and ureter: Secondary | ICD-10-CM | POA: Diagnosis not present

## 2017-05-05 DIAGNOSIS — E039 Hypothyroidism, unspecified: Secondary | ICD-10-CM | POA: Diagnosis not present

## 2017-05-05 DIAGNOSIS — I10 Essential (primary) hypertension: Secondary | ICD-10-CM | POA: Diagnosis not present

## 2017-05-06 ENCOUNTER — Ambulatory Visit
Admission: RE | Admit: 2017-05-06 | Discharge: 2017-05-06 | Disposition: A | Discharge status: Home / Self Care | Payer: Medicare Other | Source: Ambulatory Visit | Attending: Radiation Oncology | Admitting: Radiation Oncology

## 2017-05-06 DIAGNOSIS — I10 Essential (primary) hypertension: Secondary | ICD-10-CM | POA: Diagnosis not present

## 2017-05-06 DIAGNOSIS — R1310 Dysphagia, unspecified: Secondary | ICD-10-CM | POA: Diagnosis not present

## 2017-05-06 DIAGNOSIS — Z87891 Personal history of nicotine dependence: Secondary | ICD-10-CM | POA: Diagnosis not present

## 2017-05-06 DIAGNOSIS — E039 Hypothyroidism, unspecified: Secondary | ICD-10-CM | POA: Diagnosis not present

## 2017-05-06 DIAGNOSIS — M129 Arthropathy, unspecified: Secondary | ICD-10-CM | POA: Diagnosis not present

## 2017-05-06 DIAGNOSIS — N2889 Other specified disorders of kidney and ureter: Secondary | ICD-10-CM | POA: Diagnosis not present

## 2017-05-06 DIAGNOSIS — Z510 Encounter for antineoplastic radiation therapy: Secondary | ICD-10-CM | POA: Diagnosis not present

## 2017-05-06 DIAGNOSIS — C155 Malignant neoplasm of lower third of esophagus: Secondary | ICD-10-CM | POA: Diagnosis not present

## 2017-05-06 DIAGNOSIS — Z515 Encounter for palliative care: Secondary | ICD-10-CM | POA: Diagnosis not present

## 2017-05-07 ENCOUNTER — Inpatient Hospital Stay: Payer: Medicare Other

## 2017-05-07 ENCOUNTER — Ambulatory Visit
Admission: RE | Admit: 2017-05-07 | Discharge: 2017-05-07 | Disposition: A | Discharge status: Home / Self Care | Payer: Medicare Other | Source: Ambulatory Visit | Attending: Radiation Oncology | Admitting: Radiation Oncology

## 2017-05-07 ENCOUNTER — Inpatient Hospital Stay (HOSPITAL_BASED_OUTPATIENT_CLINIC_OR_DEPARTMENT_OTHER): Payer: Medicare Other | Admitting: Internal Medicine

## 2017-05-07 VITALS — BP 121/71 | HR 76 | Temp 96.2°F | Resp 16 | Wt 131.0 lb

## 2017-05-07 DIAGNOSIS — M818 Other osteoporosis without current pathological fracture: Secondary | ICD-10-CM

## 2017-05-07 DIAGNOSIS — Z510 Encounter for antineoplastic radiation therapy: Secondary | ICD-10-CM | POA: Diagnosis not present

## 2017-05-07 DIAGNOSIS — C155 Malignant neoplasm of lower third of esophagus: Secondary | ICD-10-CM

## 2017-05-07 DIAGNOSIS — I10 Essential (primary) hypertension: Secondary | ICD-10-CM

## 2017-05-07 DIAGNOSIS — E871 Hypo-osmolality and hyponatremia: Secondary | ICD-10-CM

## 2017-05-07 DIAGNOSIS — E222 Syndrome of inappropriate secretion of antidiuretic hormone: Secondary | ICD-10-CM

## 2017-05-07 DIAGNOSIS — R1310 Dysphagia, unspecified: Secondary | ICD-10-CM

## 2017-05-07 DIAGNOSIS — R634 Abnormal weight loss: Secondary | ICD-10-CM

## 2017-05-07 DIAGNOSIS — M129 Arthropathy, unspecified: Secondary | ICD-10-CM

## 2017-05-07 DIAGNOSIS — Z5111 Encounter for antineoplastic chemotherapy: Secondary | ICD-10-CM | POA: Diagnosis not present

## 2017-05-07 DIAGNOSIS — N2889 Other specified disorders of kidney and ureter: Secondary | ICD-10-CM | POA: Diagnosis not present

## 2017-05-07 DIAGNOSIS — E039 Hypothyroidism, unspecified: Secondary | ICD-10-CM | POA: Diagnosis not present

## 2017-05-07 DIAGNOSIS — Z8051 Family history of malignant neoplasm of kidney: Secondary | ICD-10-CM

## 2017-05-07 DIAGNOSIS — Z79899 Other long term (current) drug therapy: Secondary | ICD-10-CM

## 2017-05-07 DIAGNOSIS — Z7982 Long term (current) use of aspirin: Secondary | ICD-10-CM

## 2017-05-07 DIAGNOSIS — Z87891 Personal history of nicotine dependence: Secondary | ICD-10-CM | POA: Diagnosis not present

## 2017-05-07 LAB — COMPREHENSIVE METABOLIC PANEL
ALT: 20 U/L (ref 14–54)
AST: 23 U/L (ref 15–41)
Albumin: 3.5 g/dL (ref 3.5–5.0)
Alkaline Phosphatase: 102 U/L (ref 38–126)
Anion gap: 2 — ABNORMAL LOW (ref 5–15)
BUN: 23 mg/dL — AB (ref 6–20)
CHLORIDE: 103 mmol/L (ref 101–111)
CO2: 30 mmol/L (ref 22–32)
CREATININE: 1.08 mg/dL — AB (ref 0.44–1.00)
Calcium: 9 mg/dL (ref 8.9–10.3)
GFR calc Af Amer: 53 mL/min — ABNORMAL LOW (ref >60)
GFR calc non Af Amer: 46 mL/min — ABNORMAL LOW (ref >60)
GLUCOSE: 106 mg/dL — AB (ref 65–99)
Potassium: 3.8 mmol/L (ref 3.5–5.1)
SODIUM: 135 mmol/L (ref 135–145)
Total Bilirubin: 0.3 mg/dL (ref 0.3–1.2)
Total Protein: 6.3 g/dL — ABNORMAL LOW (ref 6.5–8.1)

## 2017-05-07 LAB — CBC WITH DIFFERENTIAL/PLATELET
BASOS ABS: 0 10*3/uL (ref 0–0.1)
Basophils Relative: 2 %
EOS ABS: 0 10*3/uL (ref 0–0.7)
EOS PCT: 2 %
HCT: 31.5 % — ABNORMAL LOW (ref 35.0–47.0)
Hemoglobin: 11 g/dL — ABNORMAL LOW (ref 12.0–16.0)
Lymphocytes Relative: 31 %
Lymphs Abs: 0.6 10*3/uL — ABNORMAL LOW (ref 1.0–3.6)
MCH: 30.4 pg (ref 26.0–34.0)
MCHC: 35.1 g/dL (ref 32.0–36.0)
MCV: 86.7 fL (ref 80.0–100.0)
Monocytes Absolute: 0.3 10*3/uL (ref 0.2–0.9)
Monocytes Relative: 13 %
NEUTROS ABS: 1.1 10*3/uL — AB (ref 1.4–6.5)
NEUTROS PCT: 52 %
PLATELETS: 161 10*3/uL (ref 150–440)
RBC: 3.63 MIL/uL — AB (ref 3.80–5.20)
RDW: 16.3 % — ABNORMAL HIGH (ref 11.5–14.5)
WBC: 2 10*3/uL — ABNORMAL LOW (ref 3.6–11.0)

## 2017-05-07 MED ORDER — PACLITAXEL CHEMO INJECTION 300 MG/50ML
50.0000 mg/m2 | Freq: Once | INTRAVENOUS | Status: AC
  Administered 2017-05-07: 78 mg via INTRAVENOUS
  Filled 2017-05-07: qty 13

## 2017-05-07 MED ORDER — DIPHENHYDRAMINE HCL 50 MG/ML IJ SOLN
25.0000 mg | Freq: Once | INTRAMUSCULAR | Status: AC
  Administered 2017-05-07: 25 mg via INTRAVENOUS
  Filled 2017-05-07: qty 1

## 2017-05-07 MED ORDER — SODIUM CHLORIDE 0.9 % IV SOLN
Freq: Once | INTRAVENOUS | Status: AC
  Administered 2017-05-07: 12:00:00 via INTRAVENOUS
  Filled 2017-05-07: qty 1000

## 2017-05-07 MED ORDER — FAMOTIDINE IN NACL 20-0.9 MG/50ML-% IV SOLN
20.0000 mg | Freq: Once | INTRAVENOUS | Status: AC
  Administered 2017-05-07: 20 mg via INTRAVENOUS
  Filled 2017-05-07: qty 50

## 2017-05-07 MED ORDER — SODIUM CHLORIDE 0.9 % IV SOLN
130.0000 mg | Freq: Once | INTRAVENOUS | Status: AC
  Administered 2017-05-07: 130 mg via INTRAVENOUS
  Filled 2017-05-07: qty 13

## 2017-05-07 MED ORDER — PALONOSETRON HCL INJECTION 0.25 MG/5ML
0.2500 mg | Freq: Once | INTRAVENOUS | Status: AC
  Administered 2017-05-07: 0.25 mg via INTRAVENOUS
  Filled 2017-05-07: qty 5

## 2017-05-07 MED ORDER — SODIUM CHLORIDE 0.9 % IV SOLN
20.0000 mg | Freq: Once | INTRAVENOUS | Status: AC
  Administered 2017-05-07: 20 mg via INTRAVENOUS
  Filled 2017-05-07: qty 2

## 2017-05-07 NOTE — Progress Notes (Signed)
[facility] CONSULT NOTE  Patient Care Team: Crecencio Mc, MD as PCP - General (Internal Medicine) Clent Jacks, RN as Registered Nurse  CHIEF COMPLAINTS/PURPOSE OF CONSULTATION: Esophageal cancer  #  Oncology History   # MARCH 2018- ADENO CA; mod diff [lower third of esophagus; EGD Bx; Dr.Wohl]; CT- A/P- no distant mets/LN. PET- TxN1; no EUS.  # April 5th 2018- Carbo-taxol with RT [until May 22nd 2018]  # March 2018- LEFT KIDNEY COMPLEX MASS ~2cm [incidental]; ~ 2 cm left lower lobe nodule [question second primary]  # Hx of Hyponatremia [[phone]; Dr.Lateef]  # MOLECULAR TESTING- Her 2 neu-NEGATIVE; PDL-1- POSITIVE [CPS-2]; MMR- STABLE.      Malignant neoplasm of lower third of esophagus (HCC)     HISTORY OF PRESENTING ILLNESS:  Brittney Jackson 83 y.o.  female diagnosed adenocarcinoma the Lower esophagus currently on chemoradiation is here for follow-up.   Denies any tingling or numbness. Denies any nausea vomiting. No chest pain or shortness of breath or cough. No difficulty in swallowing than baseline. No episodes of food getting stuck. Denies having needing to use Carafate.  ROS: A complete 10 point review of system is done which is negative except mentioned above in history of present illness  MEDICAL HISTORY:  Past Medical History:  Diagnosis Date  . Arthritis    left knee  . Function kidney decreased   . Hypertension   . Hypothyroidism   . Inappropriate ADH secretion (HCC)   . Low sodium levels   . Osteoporosis     SURGICAL HISTORY: Past Surgical History:  Procedure Laterality Date  . CATARACT EXTRACTION W/PHACO Left 05/29/2015   Procedure: CATARACT EXTRACTION PHACO AND INTRAOCULAR LENS PLACEMENT (IOC);  Surgeon: Leandrew Koyanagi, MD;  Location: [facility];  Service: Ophthalmology;  Laterality: Left;  . CATARACT EXTRACTION W/PHACO Right 01/15/2016   Procedure: CATARACT EXTRACTION PHACO AND INTRAOCULAR LENS PLACEMENT (IOC);   Surgeon: Leandrew Koyanagi, MD;  Location: [facility];  Service: Ophthalmology;  Laterality: Right;  SHUGARCAINE  . ESOPHAGOGASTRODUODENOSCOPY (EGD) WITH PROPOFOL N/A 03/16/2017   Procedure: ESOPHAGOGASTRODUODENOSCOPY (EGD) WITH PROPOFOL;  Surgeon: [name], MD;  Location: [facility];  Service: Endoscopy;  Laterality: N/A;  . 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 Family is in the area.  Social History   Social History  . Marital status: Widowed    Spouse name: N/A  . Number of children: N/A  . Years of education: N/A   Occupational History  . 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 Smoking status: Former Smoker    Types: Cigarettes    Quit date: 07/21/2000  . Smokeless tobacco: Never Used  . Alcohol use No  . Drug use: No  . Sexual activity: No   Other Topics Concern  . Not on file   Social History Narrative   Church activities, community service.   Lives alone.    FAMILY HISTORY: Family History  Problem Relation Age of Onset  . Heart attack Mother   . Cancer Maternal Uncle        renal cancer   . COPD Neg Hx        no breast, ovarian or colon    ALLERGIES:  is allergic to amlodipine.  MEDICATIONS:  Current Outpatient Prescriptions  Medication Sig Dispense Refill  . labetalol (NORMODYNE) 300 MG tablet Take 0.5 tablets (150 mg total)  by mouth 3 (three) times daily. 1/2 tab (150 mg) Am and PM (Patient taking differently: Take 150 mg by mouth 2 (two) times daily. 1/2 tab (150 mg) Am and PM) 270 tablet 1  . levothyroxine (SYNTHROID, LEVOTHROID) 88 MCG tablet TAKE ONE (1) TABLET EACH DAY 90 tablet 2  . losartan (COZAAR) 100 MG tablet TAKE ONE (1) TABLET BY MOUTH EVERY DAY 90 tablet 1  . sucralfate (CARAFATE) 1 g tablet Take 1 tablet (1 g total) by mouth 4 (four) times daily -  with meals and at bedtime. Dissolve in warm water, swish and swallow 90  tablet 3  . aspirin 81 MG tablet Take 81 mg by mouth daily.    . cholecalciferol (VITAMIN D) 1000 UNITS tablet Take 1,000 Units by mouth daily.    . Multiple Vitamins-Minerals (PRESERVISION AREDS PO) Take by mouth daily.    . ondansetron (ZOFRAN ODT) 4 MG disintegrating tablet Allow 1-2 tablets to dissolve in your mouth every 8 hours as needed for nausea/vomiting (Patient not taking: Reported on 03/19/2017) 30 tablet 0  . simvastatin (ZOCOR) 40 MG tablet TAKE ONE (1) TABLET AT BEDTIME (Patient not taking: Reported on 03/19/2017) 90 tablet 1   No current facility-administered medications for this visit.       Marland Kitchen  PHYSICAL EXAMINATION: ECOG PERFORMANCE STATUS: 0 - Asymptomatic  Vitals:   05/07/17 1025  BP: 121/71  Pulse: 76  Resp: 16  Temp: (!) 96.2 F (35.7 C)   Filed Weights   05/07/17 1025  Weight: 131 lb (59.4 kg)    GENERAL: Well-nourished well-developed; Alert, no distress and comfortable.   Accompanied byAnother daughter.  EYES: no pallor or icterus OROPHARYNX: no thrush or ulceration; good dentition  NECK: supple, no masses felt LYMPH:  no palpable lymphadenopathy in the cervical, axillary or inguinal regions LUNGS: clear to auscultation and  No wheeze or crackles HEART/CVS: regular rate & rhythm and no murmurs; No lower extremity edema ABDOMEN: abdomen soft, non-tender and normal bowel sounds Musculoskeletal:no cyanosis of digits and no clubbing  PSYCH: alert & oriented x 3 with fluent speech NEURO: no focal motor/sensory deficits SKIN:  no rashes or significant lesions  LABORATORY DATA:  I have reviewed the data as listed Lab Results  Component Value Date   WBC 2.0 (L) 05/07/2017   HGB 11.0 (L) 05/07/2017   HCT 31.5 (L) 05/07/2017   MCV 86.7 05/07/2017   PLT 161 05/07/2017    Recent Labs  04/23/17 0818 04/30/17 1010 05/07/17 0937  NA 137 135 135  K 4.1 3.9 3.8  CL 104 100* 103  CO2 _0 GLUCOSE 124* 122* 106*  BUN 24* 22* 23*  CREATININE 1.10*  0.92 1.08*  CALCIUM 9.1 9.2 9.0  GFRNONAA 45* 56* 46*  GFRAA 52* >60 53*  PROT 6.7 6.4* 6.3*  ALBUMIN 3.7 3.4* 3.5  AST _1 ALT _2 ALKPHOS 111 106 102  BILITOT 0.6 0.4 0.3    RADIOGRAPHIC STUDIES: I have personally reviewed the radiological images as listed and agreed with the findings in the report. No results found.  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 PET- positive paraesophgeal uptake.Txn1- stage III. On concurrent chemoradiation- carbo-taxol weekly. Tolerated treatment well.  # Proceed with cycle #4 carbotaxol weekly. Labs today reviewed;  acceptable for treatment today. Discussed adding carbo-taxol on 5/18 if tolerating well [RT until- 5/22]   #  Weight loss/difficulty swallowing- not any  Worse/ stable at 131 pounds; recommend carafate in needed. currently weight stable.  s/p dietary consultation.   # Hx of hyponatremia- sodium- 137; Restrict free water to 1000 ml. we'll continue to check weekly labs.  # follow up in 1 week/ /labs/chemo; 3 weeks/labs//MD [thursday]. Will order PET scan at that visit. ..     Cammie Sickle, MD 05/11/2017 5:24 PM

## 2017-05-07 NOTE — Progress Notes (Signed)
Patient here today for follow up.  Patient states no new concerns today  

## 2017-05-07 NOTE — Assessment & Plan Note (Addendum)
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 PET- positive paraesophgeal uptake.Txn1- stage III. On concurrent chemoradiation- carbo-taxol weekly. Tolerated treatment well.  # Proceed with cycle #4 carbotaxol weekly. Labs today reviewed;  acceptable for treatment today. Discussed adding carbo-taxol on 5/18 if tolerating well [RT until- 5/22]   # Weight loss/difficulty swallowing- not any  Worse/ stable at 131 pounds; recommend carafate in needed. currently weight stable.  s/p dietary consultation.   # Hx of hyponatremia- sodium- 137; Restrict free water to 1000 ml. we'll continue to check weekly labs.  # follow up in 1 week/ /labs/chemo; 3 weeks/labs//MD [thursday]. Will order PET scan at that visit. Marland KitchenMarland Kitchen

## 2017-05-10 ENCOUNTER — Ambulatory Visit
Admission: RE | Admit: 2017-05-10 | Discharge: 2017-05-10 | Disposition: A | Discharge status: Home / Self Care | Payer: Medicare Other | Source: Ambulatory Visit | Attending: Radiation Oncology | Admitting: Radiation Oncology

## 2017-05-10 DIAGNOSIS — M129 Arthropathy, unspecified: Secondary | ICD-10-CM | POA: Diagnosis not present

## 2017-05-10 DIAGNOSIS — C155 Malignant neoplasm of lower third of esophagus: Secondary | ICD-10-CM | POA: Diagnosis not present

## 2017-05-10 DIAGNOSIS — Z87891 Personal history of nicotine dependence: Secondary | ICD-10-CM | POA: Diagnosis not present

## 2017-05-10 DIAGNOSIS — N2889 Other specified disorders of kidney and ureter: Secondary | ICD-10-CM | POA: Diagnosis not present

## 2017-05-10 DIAGNOSIS — I10 Essential (primary) hypertension: Secondary | ICD-10-CM | POA: Diagnosis not present

## 2017-05-10 DIAGNOSIS — Z510 Encounter for antineoplastic radiation therapy: Secondary | ICD-10-CM | POA: Diagnosis not present

## 2017-05-10 DIAGNOSIS — E039 Hypothyroidism, unspecified: Secondary | ICD-10-CM | POA: Diagnosis not present

## 2017-05-11 ENCOUNTER — Ambulatory Visit
Admission: RE | Admit: 2017-05-11 | Discharge: 2017-05-11 | Disposition: A | Discharge status: Home / Self Care | Payer: Medicare Other | Source: Ambulatory Visit | Attending: Radiation Oncology | Admitting: Radiation Oncology

## 2017-05-11 DIAGNOSIS — Z87891 Personal history of nicotine dependence: Secondary | ICD-10-CM | POA: Diagnosis not present

## 2017-05-11 DIAGNOSIS — Z510 Encounter for antineoplastic radiation therapy: Secondary | ICD-10-CM | POA: Diagnosis not present

## 2017-05-11 DIAGNOSIS — N2889 Other specified disorders of kidney and ureter: Secondary | ICD-10-CM | POA: Diagnosis not present

## 2017-05-11 DIAGNOSIS — I10 Essential (primary) hypertension: Secondary | ICD-10-CM | POA: Diagnosis not present

## 2017-05-11 DIAGNOSIS — M129 Arthropathy, unspecified: Secondary | ICD-10-CM | POA: Diagnosis not present

## 2017-05-11 DIAGNOSIS — E039 Hypothyroidism, unspecified: Secondary | ICD-10-CM | POA: Diagnosis not present

## 2017-05-11 DIAGNOSIS — C155 Malignant neoplasm of lower third of esophagus: Secondary | ICD-10-CM | POA: Diagnosis not present

## 2017-05-12 ENCOUNTER — Ambulatory Visit
Admission: RE | Admit: 2017-05-12 | Discharge: 2017-05-12 | Disposition: A | Discharge status: Home / Self Care | Payer: Medicare Other | Source: Ambulatory Visit | Attending: Radiation Oncology | Admitting: Radiation Oncology

## 2017-05-12 DIAGNOSIS — E039 Hypothyroidism, unspecified: Secondary | ICD-10-CM | POA: Diagnosis not present

## 2017-05-12 DIAGNOSIS — I10 Essential (primary) hypertension: Secondary | ICD-10-CM | POA: Diagnosis not present

## 2017-05-12 DIAGNOSIS — Z510 Encounter for antineoplastic radiation therapy: Secondary | ICD-10-CM | POA: Diagnosis not present

## 2017-05-12 DIAGNOSIS — Z87891 Personal history of nicotine dependence: Secondary | ICD-10-CM | POA: Diagnosis not present

## 2017-05-12 DIAGNOSIS — M129 Arthropathy, unspecified: Secondary | ICD-10-CM | POA: Diagnosis not present

## 2017-05-12 DIAGNOSIS — C155 Malignant neoplasm of lower third of esophagus: Secondary | ICD-10-CM | POA: Diagnosis not present

## 2017-05-12 DIAGNOSIS — N2889 Other specified disorders of kidney and ureter: Secondary | ICD-10-CM | POA: Diagnosis not present

## 2017-05-13 ENCOUNTER — Ambulatory Visit
Admission: RE | Admit: 2017-05-13 | Discharge: 2017-05-13 | Disposition: A | Discharge status: Home / Self Care | Payer: Medicare Other | Source: Ambulatory Visit | Attending: Radiation Oncology | Admitting: Radiation Oncology

## 2017-05-13 DIAGNOSIS — C155 Malignant neoplasm of lower third of esophagus: Secondary | ICD-10-CM | POA: Diagnosis not present

## 2017-05-13 DIAGNOSIS — E039 Hypothyroidism, unspecified: Secondary | ICD-10-CM | POA: Diagnosis not present

## 2017-05-13 DIAGNOSIS — Z87891 Personal history of nicotine dependence: Secondary | ICD-10-CM | POA: Diagnosis not present

## 2017-05-13 DIAGNOSIS — Z510 Encounter for antineoplastic radiation therapy: Secondary | ICD-10-CM | POA: Diagnosis not present

## 2017-05-13 DIAGNOSIS — M129 Arthropathy, unspecified: Secondary | ICD-10-CM | POA: Diagnosis not present

## 2017-05-13 DIAGNOSIS — I10 Essential (primary) hypertension: Secondary | ICD-10-CM | POA: Diagnosis not present

## 2017-05-13 DIAGNOSIS — N2889 Other specified disorders of kidney and ureter: Secondary | ICD-10-CM | POA: Diagnosis not present

## 2017-05-14 ENCOUNTER — Ambulatory Visit: Payer: Medicare Other

## 2017-05-14 ENCOUNTER — Inpatient Hospital Stay: Payer: Medicare Other

## 2017-05-14 ENCOUNTER — Ambulatory Visit
Admission: RE | Admit: 2017-05-14 | Discharge: 2017-05-14 | Disposition: A | Discharge status: Home / Self Care | Payer: Medicare Other | Source: Ambulatory Visit | Attending: Radiation Oncology | Admitting: Radiation Oncology

## 2017-05-14 DIAGNOSIS — M129 Arthropathy, unspecified: Secondary | ICD-10-CM | POA: Diagnosis not present

## 2017-05-14 DIAGNOSIS — Z87891 Personal history of nicotine dependence: Secondary | ICD-10-CM | POA: Diagnosis not present

## 2017-05-14 DIAGNOSIS — C155 Malignant neoplasm of lower third of esophagus: Secondary | ICD-10-CM

## 2017-05-14 DIAGNOSIS — R634 Abnormal weight loss: Secondary | ICD-10-CM | POA: Diagnosis not present

## 2017-05-14 DIAGNOSIS — N2889 Other specified disorders of kidney and ureter: Secondary | ICD-10-CM | POA: Diagnosis not present

## 2017-05-14 DIAGNOSIS — Z510 Encounter for antineoplastic radiation therapy: Secondary | ICD-10-CM | POA: Diagnosis not present

## 2017-05-14 DIAGNOSIS — I10 Essential (primary) hypertension: Secondary | ICD-10-CM | POA: Diagnosis not present

## 2017-05-14 DIAGNOSIS — E039 Hypothyroidism, unspecified: Secondary | ICD-10-CM | POA: Diagnosis not present

## 2017-05-14 DIAGNOSIS — R1310 Dysphagia, unspecified: Secondary | ICD-10-CM | POA: Diagnosis not present

## 2017-05-14 DIAGNOSIS — Z5111 Encounter for antineoplastic chemotherapy: Secondary | ICD-10-CM | POA: Diagnosis not present

## 2017-05-14 DIAGNOSIS — E871 Hypo-osmolality and hyponatremia: Secondary | ICD-10-CM | POA: Diagnosis not present

## 2017-05-14 LAB — CBC WITH DIFFERENTIAL/PLATELET
BASOS PCT: 1 %
Basophils Absolute: 0 10*3/uL (ref 0–0.1)
EOS ABS: 0 10*3/uL (ref 0–0.7)
EOS PCT: 1 %
HCT: 31.4 % — ABNORMAL LOW (ref 35.0–47.0)
HEMOGLOBIN: 11.1 g/dL — AB (ref 12.0–16.0)
LYMPHS ABS: 0.4 10*3/uL — AB (ref 1.0–3.6)
Lymphocytes Relative: 29 %
MCH: 31 pg (ref 26.0–34.0)
MCHC: 35.5 g/dL (ref 32.0–36.0)
MCV: 87.4 fL (ref 80.0–100.0)
Monocytes Absolute: 0.3 10*3/uL (ref 0.2–0.9)
Monocytes Relative: 22 %
NEUTROS PCT: 47 %
Neutro Abs: 0.6 10*3/uL — ABNORMAL LOW (ref 1.4–6.5)
PLATELETS: 251 10*3/uL (ref 150–440)
RBC: 3.59 MIL/uL — AB (ref 3.80–5.20)
RDW: 16.4 % — ABNORMAL HIGH (ref 11.5–14.5)
WBC: 1.3 10*3/uL — AB (ref 3.6–11.0)

## 2017-05-14 LAB — COMPREHENSIVE METABOLIC PANEL
ALT: 17 U/L (ref 14–54)
AST: 27 U/L (ref 15–41)
Albumin: 3.7 g/dL (ref 3.5–5.0)
Alkaline Phosphatase: 101 U/L (ref 38–126)
Anion gap: 6 (ref 5–15)
BUN: 29 mg/dL — ABNORMAL HIGH (ref 6–20)
CHLORIDE: 100 mmol/L — AB (ref 101–111)
CO2: 27 mmol/L (ref 22–32)
Calcium: 9.3 mg/dL (ref 8.9–10.3)
Creatinine, Ser: 1.05 mg/dL — ABNORMAL HIGH (ref 0.44–1.00)
GFR calc non Af Amer: 47 mL/min — ABNORMAL LOW (ref >60)
GFR, EST AFRICAN AMERICAN: 55 mL/min — AB (ref >60)
GLUCOSE: 131 mg/dL — AB (ref 65–99)
Potassium: 4.3 mmol/L (ref 3.5–5.1)
SODIUM: 133 mmol/L — AB (ref 135–145)
Total Bilirubin: 0.7 mg/dL (ref 0.3–1.2)
Total Protein: 6.8 g/dL (ref 6.5–8.1)

## 2017-05-16 ENCOUNTER — Ambulatory Visit: Payer: Medicare Other

## 2017-05-17 ENCOUNTER — Ambulatory Visit
Admission: RE | Admit: 2017-05-17 | Discharge: 2017-05-17 | Disposition: A | Discharge status: Home / Self Care | Payer: Medicare Other | Source: Ambulatory Visit | Attending: Radiation Oncology | Admitting: Radiation Oncology

## 2017-05-17 ENCOUNTER — Ambulatory Visit: Payer: Medicare Other

## 2017-05-17 DIAGNOSIS — Z510 Encounter for antineoplastic radiation therapy: Secondary | ICD-10-CM | POA: Diagnosis not present

## 2017-05-17 DIAGNOSIS — N2889 Other specified disorders of kidney and ureter: Secondary | ICD-10-CM | POA: Diagnosis not present

## 2017-05-17 DIAGNOSIS — Z87891 Personal history of nicotine dependence: Secondary | ICD-10-CM | POA: Diagnosis not present

## 2017-05-17 DIAGNOSIS — M129 Arthropathy, unspecified: Secondary | ICD-10-CM | POA: Diagnosis not present

## 2017-05-17 DIAGNOSIS — I10 Essential (primary) hypertension: Secondary | ICD-10-CM | POA: Diagnosis not present

## 2017-05-17 DIAGNOSIS — C155 Malignant neoplasm of lower third of esophagus: Secondary | ICD-10-CM | POA: Diagnosis not present

## 2017-05-17 DIAGNOSIS — E039 Hypothyroidism, unspecified: Secondary | ICD-10-CM | POA: Diagnosis not present

## 2017-05-19 ENCOUNTER — Other Ambulatory Visit: Payer: Self-pay | Admitting: Internal Medicine

## 2017-05-19 NOTE — Telephone Encounter (Signed)
Pt daughter called and stated that she does not have any medication left. Please advise, thank you!  [place], [place]

## 2017-05-21 NOTE — Telephone Encounter (Signed)
Medication refilled

## 2017-05-28 ENCOUNTER — Inpatient Hospital Stay: Payer: Medicare Other

## 2017-05-28 ENCOUNTER — Telehealth: Payer: Self-pay | Admitting: *Deleted

## 2017-05-28 ENCOUNTER — Inpatient Hospital Stay: Payer: Medicare Other | Attending: Internal Medicine

## 2017-05-28 ENCOUNTER — Inpatient Hospital Stay (HOSPITAL_BASED_OUTPATIENT_CLINIC_OR_DEPARTMENT_OTHER): Payer: Medicare Other | Admitting: Internal Medicine

## 2017-05-28 VITALS — BP 142/80 | HR 90 | Temp 97.8°F | Resp 18 | Ht 60.0 in | Wt 128.4 lb

## 2017-05-28 DIAGNOSIS — R918 Other nonspecific abnormal finding of lung field: Secondary | ICD-10-CM | POA: Insufficient documentation

## 2017-05-28 DIAGNOSIS — R1310 Dysphagia, unspecified: Secondary | ICD-10-CM | POA: Insufficient documentation

## 2017-05-28 DIAGNOSIS — M129 Arthropathy, unspecified: Secondary | ICD-10-CM | POA: Insufficient documentation

## 2017-05-28 DIAGNOSIS — E871 Hypo-osmolality and hyponatremia: Secondary | ICD-10-CM

## 2017-05-28 DIAGNOSIS — Z7982 Long term (current) use of aspirin: Secondary | ICD-10-CM | POA: Diagnosis not present

## 2017-05-28 DIAGNOSIS — Z8051 Family history of malignant neoplasm of kidney: Secondary | ICD-10-CM

## 2017-05-28 DIAGNOSIS — Z79899 Other long term (current) drug therapy: Secondary | ICD-10-CM

## 2017-05-28 DIAGNOSIS — E039 Hypothyroidism, unspecified: Secondary | ICD-10-CM

## 2017-05-28 DIAGNOSIS — N2889 Other specified disorders of kidney and ureter: Secondary | ICD-10-CM

## 2017-05-28 DIAGNOSIS — E222 Syndrome of inappropriate secretion of antidiuretic hormone: Secondary | ICD-10-CM

## 2017-05-28 DIAGNOSIS — M810 Age-related osteoporosis without current pathological fracture: Secondary | ICD-10-CM | POA: Diagnosis not present

## 2017-05-28 DIAGNOSIS — Z9221 Personal history of antineoplastic chemotherapy: Secondary | ICD-10-CM | POA: Insufficient documentation

## 2017-05-28 DIAGNOSIS — C155 Malignant neoplasm of lower third of esophagus: Secondary | ICD-10-CM

## 2017-05-28 DIAGNOSIS — Z923 Personal history of irradiation: Secondary | ICD-10-CM | POA: Diagnosis not present

## 2017-05-28 DIAGNOSIS — Z87891 Personal history of nicotine dependence: Secondary | ICD-10-CM | POA: Diagnosis not present

## 2017-05-28 DIAGNOSIS — I10 Essential (primary) hypertension: Secondary | ICD-10-CM

## 2017-05-28 DIAGNOSIS — R630 Anorexia: Secondary | ICD-10-CM

## 2017-05-28 DIAGNOSIS — R5383 Other fatigue: Secondary | ICD-10-CM

## 2017-05-28 DIAGNOSIS — E860 Dehydration: Secondary | ICD-10-CM | POA: Insufficient documentation

## 2017-05-28 LAB — COMPREHENSIVE METABOLIC PANEL
ALT: 17 U/L (ref 14–54)
AST: 24 U/L (ref 15–41)
Albumin: 3.3 g/dL — ABNORMAL LOW (ref 3.5–5.0)
Alkaline Phosphatase: 125 U/L (ref 38–126)
Anion gap: 6 (ref 5–15)
BUN: 28 mg/dL — ABNORMAL HIGH (ref 6–20)
CHLORIDE: 95 mmol/L — AB (ref 101–111)
CO2: 29 mmol/L (ref 22–32)
CREATININE: 1.08 mg/dL — AB (ref 0.44–1.00)
Calcium: 9.4 mg/dL (ref 8.9–10.3)
GFR calc Af Amer: 53 mL/min — ABNORMAL LOW (ref >60)
GFR, EST NON AFRICAN AMERICAN: 45 mL/min — AB (ref >60)
Glucose, Bld: 108 mg/dL — ABNORMAL HIGH (ref 65–99)
POTASSIUM: 4.2 mmol/L (ref 3.5–5.1)
SODIUM: 130 mmol/L — AB (ref 135–145)
Total Bilirubin: 0.3 mg/dL (ref 0.3–1.2)
Total Protein: 6.8 g/dL (ref 6.5–8.1)

## 2017-05-28 LAB — CBC WITH DIFFERENTIAL/PLATELET
Basophils Absolute: 0 10*3/uL (ref 0–0.1)
Basophils Relative: 1 %
EOS ABS: 0 10*3/uL (ref 0–0.7)
Eosinophils Relative: 0 %
HCT: 31.4 % — ABNORMAL LOW (ref 35.0–47.0)
Hemoglobin: 11 g/dL — ABNORMAL LOW (ref 12.0–16.0)
LYMPHS ABS: 0.6 10*3/uL — AB (ref 1.0–3.6)
LYMPHS PCT: 10 %
MCH: 31 pg (ref 26.0–34.0)
MCHC: 35.1 g/dL (ref 32.0–36.0)
MCV: 88.4 fL (ref 80.0–100.0)
MONO ABS: 1.6 10*3/uL — AB (ref 0.2–0.9)
Monocytes Relative: 27 %
Neutro Abs: 3.7 10*3/uL (ref 1.4–6.5)
Neutrophils Relative %: 62 %
PLATELETS: 252 10*3/uL (ref 150–440)
RBC: 3.55 MIL/uL — ABNORMAL LOW (ref 3.80–5.20)
RDW: 17.4 % — AB (ref 11.5–14.5)
WBC: 5.9 10*3/uL (ref 3.6–11.0)

## 2017-05-28 MED ORDER — DEXAMETHASONE 4 MG PO TABS: 4.0000 mg | ORAL_TABLET | Freq: Two times a day (BID) | ORAL | 0 refills | Status: DC

## 2017-05-28 MED ORDER — SODIUM CHLORIDE 0.9 % IV SOLN
Freq: Once | INTRAVENOUS | Status: AC
  Administered 2017-05-28: 12:00:00 via INTRAVENOUS
  Filled 2017-05-28: qty 1000

## 2017-05-28 NOTE — Progress Notes (Signed)
[facility] CONSULT NOTE  Patient Care Team: Crecencio Mc, MD as PCP - General (Internal Medicine) Clent Jacks, RN as Registered Nurse  CHIEF COMPLAINTS/PURPOSE OF CONSULTATION: Esophageal cancer  #  Oncology History   # MARCH 2018- [place] CA; mod diff [lower third of esophagus; EGD Bx; Dr.Wohl]; CT- A/P- no distant mets/LN. PET- TxN1; no EUS.  # April 5th 2018- Carbo-taxol with RT [until May 22nd 2018]  # March 2018- LEFT KIDNEY COMPLEX MASS ~2cm [incidental]; ~ 2 cm left lower lobe nodule [question second primary]  # Hx of Hyponatremia [[phone]; Dr.Lateef]  # MOLECULAR TESTING- Her 2 neu-NEGATIVE; PDL-1- POSITIVE [CPS-2]; MMR- STABLE.      Malignant neoplasm of lower third of esophagus (HCC)     HISTORY OF PRESENTING ILLNESS:  Brittney Jackson 83 y.o.  female diagnosed adenocarcinoma the Lower esophagus currently s/p  chemoradiation is here for follow-up [finished RT on may 21st].  Patient complains of difficulty swallowing; pain was swallowing. Poor appetite. She does not have the taste for boost. Patient has been using Carafate. not significantly helping. Patient also stating her blood pressure has been going "up and down". She denies any dizziness. Admits to fatigue.   Denies any tingling or numbness.  No chest pain or shortness of breath or cough.   ROS: A complete 10 point review of system is done which is negative except mentioned above in history of present illness  MEDICAL HISTORY:  Past Medical History:  Diagnosis Date  . Arthritis    left knee  . Function kidney decreased   . Hypertension   . Hypothyroidism   . Inappropriate ADH secretion (HCC)   . Low sodium levels   . Osteoporosis     SURGICAL HISTORY: Past Surgical History:  Procedure Laterality Date  . CATARACT EXTRACTION W/PHACO Left 05/29/2015   Procedure: CATARACT EXTRACTION PHACO AND INTRAOCULAR LENS PLACEMENT (IOC);  Surgeon: Leandrew Koyanagi, MD;  Location: [facility];  Service: Ophthalmology;  Laterality: Left;  . CATARACT EXTRACTION W/PHACO Right 01/15/2016   Procedure: CATARACT EXTRACTION PHACO AND INTRAOCULAR LENS PLACEMENT (IOC);  Surgeon: Leandrew Koyanagi, MD;  Location: [facility];  Service: Ophthalmology;  Laterality: Right;  SHUGARCAINE  . ESOPHAGOGASTRODUODENOSCOPY (EGD) WITH PROPOFOL N/A 03/16/2017   Procedure: ESOPHAGOGASTRODUODENOSCOPY (EGD) WITH PROPOFOL;  Surgeon: [name], MD;  Location: [facility];  Service: Endoscopy;  Laterality: N/A;  . 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 Family is in the area.  Social History   Social History  . Marital status: Widowed    Spouse name: N/A  . Number of children: N/A  . Years of education: N/A   Occupational History  . 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 Smoking status: Former Smoker    Types: Cigarettes    Quit date: 07/21/2000  . Smokeless tobacco: Never Used  . Alcohol use No  . Drug use: No  . Sexual activity: No   Other Topics Concern  . Not on file   Social History Narrative   Church activities, community service.   Lives alone.    FAMILY HISTORY: Family History  Problem Relation Age of Onset  . Heart attack Mother   . Cancer Maternal Uncle        renal cancer   . COPD Neg Hx        no breast, ovarian or colon  ALLERGIES:  is allergic to amlodipine.  MEDICATIONS:  Current Outpatient Prescriptions  Medication Sig Dispense Refill  . levothyroxine (SYNTHROID, LEVOTHROID) 88 MCG tablet TAKE ONE (1) TABLET EACH DAY 90 tablet 1  . sucralfate (CARAFATE) 1 g tablet Take 1 tablet (1 g total) by mouth 4 (four) times daily -  with meals and at bedtime. Dissolve in warm water, swish and swallow 90 tablet 3  . aspirin 81 MG tablet Take 81 mg by mouth daily.    . cholecalciferol (VITAMIN D) 1000 UNITS tablet Take 1,000 Units by mouth daily.     Marland Kitchen dexamethasone (DECADRON) 4 MG tablet Take 1 tablet (4 mg total) by mouth 2 (two) times daily. 30 tablet 0  . labetalol (NORMODYNE) 300 MG tablet Take 0.5 tablets (150 mg total) by mouth 3 (three) times daily. 1/2 tab (150 mg) Am and PM (Patient taking differently: Take 150 mg by mouth 2 (two) times daily. 1/2 tab (150 mg) Am and PM) 270 tablet 1  . losartan (COZAAR) 100 MG tablet TAKE ONE (1) TABLET BY MOUTH EVERY DAY (Patient not taking: Reported on 05/28/2017) 90 tablet 1  . Multiple Vitamins-Minerals (PRESERVISION AREDS PO) Take by mouth daily.    . ondansetron (ZOFRAN ODT) 4 MG disintegrating tablet Allow 1-2 tablets to dissolve in your mouth every 8 hours as needed for nausea/vomiting (Patient not taking: Reported on 03/19/2017) 30 tablet 0  . simvastatin (ZOCOR) 40 MG tablet TAKE ONE (1) TABLET AT BEDTIME (Patient not taking: Reported on 03/19/2017) 90 tablet 1   No current facility-administered medications for this visit.    Facility-Administered Medications Ordered in Other Visits  Medication Dose Route Frequency Provider Last Rate Last Dose  . 0.9 %  sodium chloride infusion   Intravenous Once Cammie Sickle, MD   Stopped at 05/28/17 1405      .  PHYSICAL EXAMINATION: ECOG PERFORMANCE STATUS: 0 - Asymptomatic  Vitals:   05/28/17 1035  BP: (!) 142/80  Pulse: 90  Resp: 18  Temp: 97.8 F (36.6 C)   Filed Weights   05/28/17 1035  Weight: 128 lb 6.4 oz (58.2 kg)    GENERAL: Well-nourished well-developed; Alert, no distress and comfortable.   Accompanied byAnother daughter.  EYES: no pallor or icterus OROPHARYNX: no thrush or ulceration; good dentition  NECK: supple, no masses felt LYMPH:  no palpable lymphadenopathy in the cervical, axillary or inguinal regions LUNGS: clear to auscultation and  No wheeze or crackles HEART/CVS: regular rate & rhythm and no murmurs; No lower extremity edema ABDOMEN: abdomen soft, non-tender and normal bowel  sounds Musculoskeletal:no cyanosis of digits and no clubbing  PSYCH: alert & oriented x 3 with fluent speech NEURO: no focal motor/sensory deficits SKIN:  no rashes or significant lesions  LABORATORY DATA:  I have reviewed the data as listed Lab Results  Component Value Date   WBC 5.9 05/28/2017   HGB 11.0 (L) 05/28/2017   HCT 31.4 (L) 05/28/2017   MCV 88.4 05/28/2017   PLT 252 05/28/2017    Recent Labs  05/07/17 0937 05/14/17 0830 05/28/17 1022  NA 135 133* 130*  K 3.8 4.3 4.2  CL 103 100* 95*  CO2 30 27 29   GLUCOSE 106* 131* 108*  BUN 23* 29* 28*  CREATININE 1.08* 1.05* 1.08*  CALCIUM 9.0 9.3 9.4  GFRNONAA 46* 47* 45*  GFRAA 53* 55* 53*  PROT 6.3* 6.8 6.8  ALBUMIN 3.5 3.7 3.3*  AST 23 27 24   ALT 20 17 17  ALKPHOS 102 101 125  BILITOT 0.3 0.7 0.3    RADIOGRAPHIC STUDIES: I have personally reviewed the radiological images as listed and agreed with the findings in the report. No results found.  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 PET- positive paraesophgeal uptake.Txn1- stage III. S/p  concurrent chemoradiation- carbo-taxol weekly [finished May 21st]. Tolerated treatment well- except now noting to have side effects [C discussion below].   # Status post chemoradiation [May 21]- recommend repeating a PET scan in mid-late July.  If patient has progressive disease- should be candidate for immunotherapy [PDL-1 positive]  # Weight loss/difficulty swallowing- today 130 [prior history of severe hyponatremia]; recommend IV fluids today; also recommend dexamethasone so twice a day for 2 weeks; also recommend Prilosec twice a day  # dyespepsia-see above  # Labile blood pressures- secondary to poor by mouth intake; recommend checking blood pressures frequently at home; and adjust medications- to avoid significant hypotension.   #  Weekly labs/ IVFs/ x4; PEt scan-  23rd  week of July/ and then see me few days later/labs- cbc/cmp.     Cammie Sickle, MD 05/28/2017 12:30 PM

## 2017-05-28 NOTE — Assessment & Plan Note (Addendum)
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 PET- positive paraesophgeal uptake.Txn1- stage III. S/p  concurrent chemoradiation- carbo-taxol weekly [finished May 21st]. Tolerated treatment well- except now noting to have side effects [C discussion below].   # Status post chemoradiation [May 21]- recommend repeating a PET scan in mid-late July.  If patient has progressive disease- should be candidate for immunotherapy [PDL-1 positive]  # Weight loss/difficulty swallowing- today 130 [prior history of severe hyponatremia]; recommend IV fluids today; also recommend dexamethasone so twice a day for 2 weeks; also recommend Prilosec twice a day  # dyespepsia-see above  # Labile blood pressures- secondary to poor by mouth intake; recommend checking blood pressures frequently at home; and adjust medications- to avoid significant hypotension.   #  Weekly labs/ IVFs/ x4; PEt scan-  23rd week of July/ and then see me few days later/labs- cbc/cmp.

## 2017-05-28 NOTE — Telephone Encounter (Signed)
Spoke with pt and daughter face to face after infusion apt. Daughter voiced concerns about her mom's inability to swallow Prilosec. She stated that mom often gets capsules stuck in her throat. She would like to have a tablet that she could potentially crush in applesauce if needed. She states that the insurance may not pay for Protonix. She would like to try zantac 150 mg twice daily if unable to swallow prilosec. I explained to pt's daughter that generally the prilosec capsules are tiny, however, Dr. Rogue Bussing would be ok with patient switching to zantac if unable to swallow prilosec.  Daughter and patient gave verbal understanding.

## 2017-06-04 ENCOUNTER — Inpatient Hospital Stay: Payer: Medicare Other

## 2017-06-04 DIAGNOSIS — C155 Malignant neoplasm of lower third of esophagus: Secondary | ICD-10-CM

## 2017-06-04 DIAGNOSIS — R630 Anorexia: Secondary | ICD-10-CM | POA: Diagnosis not present

## 2017-06-04 DIAGNOSIS — E871 Hypo-osmolality and hyponatremia: Secondary | ICD-10-CM | POA: Diagnosis not present

## 2017-06-04 DIAGNOSIS — N2889 Other specified disorders of kidney and ureter: Secondary | ICD-10-CM | POA: Diagnosis not present

## 2017-06-04 DIAGNOSIS — R1310 Dysphagia, unspecified: Secondary | ICD-10-CM | POA: Diagnosis not present

## 2017-06-04 DIAGNOSIS — R918 Other nonspecific abnormal finding of lung field: Secondary | ICD-10-CM | POA: Diagnosis not present

## 2017-06-04 LAB — CBC WITH DIFFERENTIAL/PLATELET
BASOS ABS: 0 10*3/uL (ref 0–0.1)
BASOS PCT: 0 %
EOS PCT: 0 %
Eosinophils Absolute: 0 10*3/uL (ref 0–0.7)
HCT: 32.8 % — ABNORMAL LOW (ref 35.0–47.0)
Hemoglobin: 11.8 g/dL — ABNORMAL LOW (ref 12.0–16.0)
Lymphocytes Relative: 4 %
Lymphs Abs: 0.4 10*3/uL — ABNORMAL LOW (ref 1.0–3.6)
MCH: 31.5 pg (ref 26.0–34.0)
MCHC: 36 g/dL (ref 32.0–36.0)
MCV: 87.6 fL (ref 80.0–100.0)
MONO ABS: 1.2 10*3/uL — AB (ref 0.2–0.9)
Monocytes Relative: 11 %
Neutro Abs: 8.9 10*3/uL — ABNORMAL HIGH (ref 1.4–6.5)
Neutrophils Relative %: 85 %
PLATELETS: 378 10*3/uL (ref 150–440)
RBC: 3.75 MIL/uL — AB (ref 3.80–5.20)
RDW: 18.6 % — AB (ref 11.5–14.5)
WBC: 10.5 10*3/uL (ref 3.6–11.0)

## 2017-06-04 LAB — COMPREHENSIVE METABOLIC PANEL
ALT: 21 U/L (ref 14–54)
ANION GAP: 8 (ref 5–15)
AST: 24 U/L (ref 15–41)
Albumin: 3.5 g/dL (ref 3.5–5.0)
Alkaline Phosphatase: 110 U/L (ref 38–126)
BILIRUBIN TOTAL: 0.4 mg/dL (ref 0.3–1.2)
BUN: 37 mg/dL — ABNORMAL HIGH (ref 6–20)
CALCIUM: 9.6 mg/dL (ref 8.9–10.3)
CO2: 28 mmol/L (ref 22–32)
Chloride: 93 mmol/L — ABNORMAL LOW (ref 101–111)
Creatinine, Ser: 1 mg/dL (ref 0.44–1.00)
GFR calc non Af Amer: 50 mL/min — ABNORMAL LOW (ref >60)
GFR, EST AFRICAN AMERICAN: 58 mL/min — AB (ref >60)
GLUCOSE: 124 mg/dL — AB (ref 65–99)
Potassium: 4.2 mmol/L (ref 3.5–5.1)
Sodium: 129 mmol/L — ABNORMAL LOW (ref 135–145)
TOTAL PROTEIN: 7 g/dL (ref 6.5–8.1)

## 2017-06-04 MED ORDER — SODIUM CHLORIDE 0.9 % IV SOLN
Freq: Once | INTRAVENOUS | Status: DC
  Filled 2017-06-04: qty 1000

## 2017-06-04 NOTE — Progress Notes (Signed)
Patient does not feel like she need IVF's today.  Informed Dr. Janese Banks, she is okay with patient not getting fluids based on labs.

## 2017-06-07 ENCOUNTER — Telehealth: Payer: Self-pay

## 2017-06-07 NOTE — Telephone Encounter (Signed)
Nutrition Follow-up:  Spoke with patient via phone for nutrition follow-up.  Treatment days on not on days when RD is at clinic.  Patient with esophageal cancer and receiving chemotherapy.  Has completed radiation therapy.    Patient reports that she has been eating high sodium foods to try and increase Na level (boullion soups, gatorade). Patient reports that appetite is better and starting to get hungry pains back.  Reports that she had a hard time after last chemotherapy treatment and was not eating much.  Reports that she drinks at least 1 500 calorie Boost Very High Calorie shake every day.  Has been trying to balance out calories and protein.      Medications: reviewed  Labs: reviewed  Anthropometrics:   128 lb 6.4 oz noted on 6/1 decreased from 131 lb 8 oz on 5/7.      NUTRITION DIAGNOSIS: Malnutrition severe stable   MALNUTRITION DIAGNOSIS: severe malnutrition stable   INTERVENTION:   Encouraged patient to continue with consuming high calorie, high protein foods.   Offered recipes for high calorie, high protein shakes and patient refused at this time.       MONITORING, EVALUATION, GOAL: Patient will consume high calorie, high protein foods to prevent weight loss   NEXT VISIT: as needed  Brittney Jackson B. Zenia Resides, [place], [place] Registered Dietitian [phone] (pager)

## 2017-06-11 ENCOUNTER — Inpatient Hospital Stay: Payer: Medicare Other

## 2017-06-11 ENCOUNTER — Other Ambulatory Visit: Payer: Self-pay | Admitting: *Deleted

## 2017-06-11 VITALS — BP 146/77 | HR 82 | Temp 98.5°F | Resp 18

## 2017-06-11 DIAGNOSIS — C155 Malignant neoplasm of lower third of esophagus: Secondary | ICD-10-CM

## 2017-06-11 DIAGNOSIS — R630 Anorexia: Secondary | ICD-10-CM | POA: Diagnosis not present

## 2017-06-11 DIAGNOSIS — E871 Hypo-osmolality and hyponatremia: Secondary | ICD-10-CM | POA: Diagnosis not present

## 2017-06-11 DIAGNOSIS — R918 Other nonspecific abnormal finding of lung field: Secondary | ICD-10-CM | POA: Diagnosis not present

## 2017-06-11 DIAGNOSIS — N2889 Other specified disorders of kidney and ureter: Secondary | ICD-10-CM | POA: Diagnosis not present

## 2017-06-11 DIAGNOSIS — R1310 Dysphagia, unspecified: Secondary | ICD-10-CM | POA: Diagnosis not present

## 2017-06-11 LAB — CBC WITH DIFFERENTIAL/PLATELET
Band Neutrophils: 2 %
Basophils Absolute: 0 10*3/uL (ref 0–0.1)
Basophils Relative: 0 %
Eosinophils Absolute: 0 10*3/uL (ref 0–0.7)
Eosinophils Relative: 0 %
HCT: 36 % (ref 35.0–47.0)
Hemoglobin: 12.5 g/dL (ref 12.0–16.0)
LYMPHS PCT: 1 %
Lymphs Abs: 0.1 10*3/uL — ABNORMAL LOW (ref 1.0–3.6)
MCH: 30.9 pg (ref 26.0–34.0)
MCHC: 34.8 g/dL (ref 32.0–36.0)
MCV: 88.7 fL (ref 80.0–100.0)
METAMYELOCYTES PCT: 1 %
MONO ABS: 2.3 10*3/uL — AB (ref 0.2–0.9)
MONOS PCT: 18 %
Neutro Abs: 10.3 10*3/uL — ABNORMAL HIGH (ref 1.4–6.5)
Neutrophils Relative %: 78 %
PLATELETS: 330 10*3/uL (ref 150–440)
RBC: 4.06 MIL/uL (ref 3.80–5.20)
RDW: 17.8 % — AB (ref 11.5–14.5)
SMEAR REVIEW: ADEQUATE
WBC: 12.7 10*3/uL — AB (ref 3.6–11.0)

## 2017-06-11 LAB — COMPREHENSIVE METABOLIC PANEL
ALT: 18 U/L (ref 14–54)
ANION GAP: 7 (ref 5–15)
AST: 21 U/L (ref 15–41)
Albumin: 3.4 g/dL — ABNORMAL LOW (ref 3.5–5.0)
Alkaline Phosphatase: 105 U/L (ref 38–126)
BUN: 40 mg/dL — ABNORMAL HIGH (ref 6–20)
CO2: 29 mmol/L (ref 22–32)
CREATININE: 1.06 mg/dL — AB (ref 0.44–1.00)
Calcium: 9 mg/dL (ref 8.9–10.3)
Chloride: 91 mmol/L — ABNORMAL LOW (ref 101–111)
GFR calc non Af Amer: 47 mL/min — ABNORMAL LOW (ref >60)
GFR, EST AFRICAN AMERICAN: 54 mL/min — AB (ref >60)
Glucose, Bld: 110 mg/dL — ABNORMAL HIGH (ref 65–99)
POTASSIUM: 4.2 mmol/L (ref 3.5–5.1)
SODIUM: 127 mmol/L — AB (ref 135–145)
Total Bilirubin: 0.6 mg/dL (ref 0.3–1.2)
Total Protein: 6.7 g/dL (ref 6.5–8.1)

## 2017-06-11 MED ORDER — SODIUM CHLORIDE 0.9 % IV SOLN
Freq: Once | INTRAVENOUS | Status: AC
  Administered 2017-06-11: 14:00:00 via INTRAVENOUS
  Filled 2017-06-11: qty 1000

## 2017-06-11 MED ORDER — ONDANSETRON HCL 40 MG/20ML IJ SOLN
Freq: Once | INTRAMUSCULAR | Status: AC
  Administered 2017-06-11: 14:00:00 via INTRAVENOUS
  Filled 2017-06-11: qty 4

## 2017-06-11 MED ORDER — HYDROCODONE-ACETAMINOPHEN 5-325 MG PO TABS: 1.0000 | ORAL_TABLET | Freq: Four times a day (QID) | ORAL | 0 refills | Status: DC | PRN

## 2017-06-11 NOTE — Progress Notes (Signed)
Per pt report that she has had poor appetite for the past couple of days and feels "bad today", and was not able to sleep last night d/t increased pain to mid epigastric area and back she rates as a 8/10 and describes as a aching/constant/nagging pain. Pt educated on ways to increase Na naturally, as well as limiting water intake (per pt and daughter pt has not been drinking much the past few days). Pt verbalizes nausea this a.m. Requiring her to take antiemetics. During administration of IVF today, encouraged pt to eat vanilla pudding, pt tolerated well, sipping on water while eating though pt discouraged to do so d/t hyponatremia (encouraged diluted juice or soda, pt declined). Dr Janese Banks and Lanetta Inch, RN notified of pts request for pain meds d/t increased pain, will follow up prior to completion of today's tx. 1610 Pts daughter requesting to speak with Dr Janese Banks d/t pts labs and hx of being hospitalized d/t Na levels reaching 126; Dr Janese Banks notified and states that she will speak with pt prior to her leaving today, pt/pts daughter informed. RX for Norco given to pts daughter to be filled, instructions provided, no other questions at this time.

## 2017-06-13 ENCOUNTER — Emergency Department
Admission: EM | Admit: 2017-06-13 | Discharge: 2017-06-13 | Disposition: A | Discharge status: Home / Self Care | Payer: Medicare Other | Attending: Student in an Organized Health Care Education/Training Program | Admitting: Student in an Organized Health Care Education/Training Program

## 2017-06-13 ENCOUNTER — Encounter: Payer: Self-pay | Admitting: Emergency Medicine

## 2017-06-13 DIAGNOSIS — I10 Essential (primary) hypertension: Secondary | ICD-10-CM | POA: Diagnosis not present

## 2017-06-13 DIAGNOSIS — Z809 Family history of malignant neoplasm, unspecified: Secondary | ICD-10-CM | POA: Diagnosis not present

## 2017-06-13 DIAGNOSIS — E871 Hypo-osmolality and hyponatremia: Secondary | ICD-10-CM | POA: Insufficient documentation

## 2017-06-13 DIAGNOSIS — R112 Nausea with vomiting, unspecified: Secondary | ICD-10-CM

## 2017-06-13 DIAGNOSIS — R1013 Epigastric pain: Secondary | ICD-10-CM | POA: Insufficient documentation

## 2017-06-13 DIAGNOSIS — Z7982 Long term (current) use of aspirin: Secondary | ICD-10-CM | POA: Insufficient documentation

## 2017-06-13 DIAGNOSIS — Z87891 Personal history of nicotine dependence: Secondary | ICD-10-CM | POA: Insufficient documentation

## 2017-06-13 DIAGNOSIS — Z79899 Other long term (current) drug therapy: Secondary | ICD-10-CM | POA: Insufficient documentation

## 2017-06-13 HISTORY — DX: Disorder of kidney and ureter, unspecified: N28.9

## 2017-06-13 HISTORY — DX: Malignant (primary) neoplasm, unspecified: C80.1

## 2017-06-13 LAB — COMPREHENSIVE METABOLIC PANEL
ALBUMIN: 3.2 g/dL — AB (ref 3.5–5.0)
ALK PHOS: 95 U/L (ref 38–126)
ALT: 16 U/L (ref 14–54)
ANION GAP: 6 (ref 5–15)
AST: 24 U/L (ref 15–41)
BILIRUBIN TOTAL: 0.6 mg/dL (ref 0.3–1.2)
BUN: 29 mg/dL — ABNORMAL HIGH (ref 6–20)
CO2: 27 mmol/L (ref 22–32)
Calcium: 9.1 mg/dL (ref 8.9–10.3)
Chloride: 97 mmol/L — ABNORMAL LOW (ref 101–111)
Creatinine, Ser: 0.89 mg/dL (ref 0.44–1.00)
GFR calc Af Amer: >60 mL/min (ref >60)
GFR calc non Af Amer: 57 mL/min — ABNORMAL LOW (ref >60)
GLUCOSE: 123 mg/dL — AB (ref 65–99)
POTASSIUM: 3.8 mmol/L (ref 3.5–5.1)
Sodium: 130 mmol/L — ABNORMAL LOW (ref 135–145)
TOTAL PROTEIN: 6.7 g/dL (ref 6.5–8.1)

## 2017-06-13 LAB — CBC
HEMATOCRIT: 34.6 % — AB (ref 35.0–47.0)
HEMOGLOBIN: 12.2 g/dL (ref 12.0–16.0)
MCH: 31.6 pg (ref 26.0–34.0)
MCHC: 35.3 g/dL (ref 32.0–36.0)
MCV: 89.4 fL (ref 80.0–100.0)
Platelets: 258 10*3/uL (ref 150–440)
RBC: 3.87 MIL/uL (ref 3.80–5.20)
RDW: 17.4 % — AB (ref 11.5–14.5)
WBC: 10.8 10*3/uL (ref 3.6–11.0)

## 2017-06-13 LAB — LIPASE, BLOOD: Lipase: 34 U/L (ref 11–51)

## 2017-06-13 MED ORDER — ONDANSETRON HCL 4 MG/2ML IJ SOLN
4.0000 mg | Freq: Once | INTRAMUSCULAR | Status: AC
  Administered 2017-06-13: 4 mg via INTRAVENOUS

## 2017-06-13 MED ORDER — SODIUM CHLORIDE 0.9 % IV BOLUS (SEPSIS)
1000.0000 mL | Freq: Once | INTRAVENOUS | Status: AC
  Administered 2017-06-13: 1000 mL via INTRAVENOUS

## 2017-06-13 MED ORDER — ONDANSETRON HCL 4 MG/2ML IJ SOLN
INTRAMUSCULAR | Status: AC
  Administered 2017-06-13: 4 mg via INTRAVENOUS
  Filled 2017-06-13: qty 2

## 2017-06-13 MED ORDER — ONDANSETRON 4 MG PO TBDP: 4.0000 mg | ORAL_TABLET | Freq: Three times a day (TID) | ORAL | 0 refills | Status: DC | PRN

## 2017-06-13 NOTE — ED Triage Notes (Signed)
Pt to ED via POV with family, family states that last Friday at the oncologist her sodium level was 127. This morning has not been able to keep any fluid downs today, pt states that every time she takes sip of fluids she vomits. Pt has been taking ODT Zofran without relief.

## 2017-06-13 NOTE — ED Notes (Signed)
Patient tolerated PO challenge well with no nausea noted at this time.

## 2017-06-13 NOTE — ED Notes (Signed)
ED Provider at bedside. 

## 2017-06-13 NOTE — ED Notes (Signed)
Pt is a cancer patient, last treatment was on 5/21 (radiation), chemo treatment was 1 week before that.

## 2017-06-13 NOTE — ED Provider Notes (Signed)
[facility] Emergency Department Provider Note    None    (approximate)  I have reviewed the triage vital signs and the nursing notes.   HISTORY  Chief Complaint abnormal lab (hyponatremia) and Emesis    HPI Brittney Jackson is a 83 y.o. female presents with chief complaint of nausea vomiting and epigastric pain with concern for low sodium.  Patient states that she's had recurrent episodes of nausea vomiting due to esophageal cancer status post radiation therapy. Patient tried taking Zofran ODT without any improvement. Denies any recent fevers. No chest pain or shortness of breath. No diarrhea. No dysuria. According to record her report the patient's sodium has been steadily decreasing over the past several weeks. It is 127 in clinic this past week.  She is given a bolus of IV fluids in clinic.   Past Medical History:  Diagnosis Date  . Arthritis    left knee  . Cancer ([place])   . Function kidney decreased   . Hypertension   . Hypothyroidism   . Inappropriate ADH secretion (HCC)   . Low sodium levels   . Osteoporosis   . Renal disorder    Family History  Problem Relation Age of Onset  . Heart attack Mother   . Cancer Maternal Uncle        renal cancer   . COPD Neg Hx        no breast, ovarian or colon   Past Surgical History:  Procedure Laterality Date  . CATARACT EXTRACTION W/PHACO Left 05/29/2015   Procedure: CATARACT EXTRACTION PHACO AND INTRAOCULAR LENS PLACEMENT (IOC);  Surgeon: Leandrew Koyanagi, MD;  Location: [facility];  Service: Ophthalmology;  Laterality: Left;  . CATARACT EXTRACTION W/PHACO Right 01/15/2016   Procedure: CATARACT EXTRACTION PHACO AND INTRAOCULAR LENS PLACEMENT (IOC);  Surgeon: Leandrew Koyanagi, MD;  Location: [facility];  Service: Ophthalmology;  Laterality: Right;  SHUGARCAINE  . ESOPHAGOGASTRODUODENOSCOPY (EGD) WITH PROPOFOL N/A 03/16/2017   Procedure: ESOPHAGOGASTRODUODENOSCOPY (EGD) WITH  PROPOFOL;  Surgeon: [name], MD;  Location: [facility] ENDOSCOPY;  Service: Endoscopy;  Laterality: N/A;  . TONSILLECTOMY     Patient Active Problem List   Diagnosis Date Noted  . Dehydration 05/28/2017  . Malignant neoplasm of lower third of esophagus ([place]) 03/19/2017  . Dysphagia 03/06/2017  . Breast cancer screening 03/06/2017  . S/P left cataract extraction 09/01/2015  . Insomnia 08/30/2014  . Hyposmolality and/or hyponatremia 01/09/2014  . Routine general medical examination at a health care facility 01/24/2013  . Osteopenia 04/26/2012  . White coat hypertension 01/22/2012  . Hypothyroidism 01/22/2012  . Hyperlipidemia with target LDL less than 100 01/22/2012      Prior to Admission medications   Medication Sig Start Date End Date Taking? Authorizing Provider  aspirin 81 MG tablet Take 81 mg by mouth daily.    [provider]  cholecalciferol (VITAMIN D) 1000 UNITS tablet Take 1,000 Units by mouth daily.    [provider]  dexamethasone (DECADRON) 4 MG tablet Take 1 tablet (4 mg total) by mouth 2 (two) times daily. 05/28/17   Cammie Sickle, MD  HYDROcodone-acetaminophen (NORCO) 5-325 MG tablet Take 1 tablet by mouth every 6 (six) hours as needed for moderate pain. 06/11/17   Sindy Guadeloupe, MD  labetalol (NORMODYNE) 300 MG tablet Take 0.5 tablets (150 mg total) by mouth 3 (three) times daily. 1/2 tab (150 mg) Am and PM Patient taking differently: Take 150 mg by mouth 2 (two) times daily. 1/2  tab (150 mg) Am and PM 02/28/16   Crecencio Mc, MD  levothyroxine (SYNTHROID, LEVOTHROID) 88 MCG tablet TAKE ONE (1) TABLET EACH DAY 05/19/17   Crecencio Mc, MD  losartan (COZAAR) 100 MG tablet TAKE ONE (1) TABLET BY MOUTH EVERY DAY Patient not taking: Reported on 05/28/2017 12/23/16   Crecencio Mc, MD  Multiple Vitamins-Minerals (PRESERVISION AREDS PO) Take by mouth daily.    [provider]  ondansetron (ZOFRAN ODT) 4 MG disintegrating tablet Allow 1-2  tablets to dissolve in your mouth every 8 hours as needed for nausea/vomiting Patient not taking: Reported on 03/19/2017 03/14/17   Hinda Kehr, MD  simvastatin (ZOCOR) 40 MG tablet TAKE ONE (1) TABLET AT BEDTIME Patient not taking: Reported on 03/19/2017 09/14/16   Crecencio Mc, MD  sucralfate (CARAFATE) 1 g tablet Take 1 tablet (1 g total) by mouth 4 (four) times daily -  with meals and at bedtime. Dissolve in warm water, swish and swallow 04/06/17   Noreene Filbert, MD    Allergies Amlodipine    Social History Social History  Substance Use Topics  . Smoking status: Former Smoker    Types: Cigarettes    Quit date: 07/21/2000  . Smokeless tobacco: Never Used  . Alcohol use No    Review of Systems Patient denies headaches, rhinorrhea, blurry vision, numbness, shortness of breath, chest pain, edema, cough, abdominal pain, nausea, vomiting, diarrhea, dysuria, fevers, rashes or hallucinations unless otherwise stated above in HPI. ____________________________________________   PHYSICAL EXAM:  VITAL SIGNS: Vitals:   06/13/17 1350  BP: (!) 138/59  Pulse: 76  Resp: 16  Temp: 98.5 F (36.9 C)    Constitutional: Alert and oriented.  in no acute distress. Eyes: Conjunctivae are normal.  Head: Atraumatic. Nose: No congestion/rhinnorhea. Mouth/Throat: Mucous membranes are moist.   Neck: No stridor. Painless ROM.  Cardiovascular: Normal rate, regular rhythm. Grossly normal heart sounds.  Good peripheral circulation. Respiratory: Normal respiratory effort.  No retractions. Lungs CTAB. Gastrointestinal: Soft and nontender. No distention. No abdominal bruits. No CVA tenderness. Genitourinary:  Musculoskeletal: No lower extremity tenderness nor edema.  No joint effusions. Neurologic:  Normal speech and language. No gross focal neurologic deficits are appreciated. No facial droop Skin:  Skin is warm, dry and intact. No rash noted. Psychiatric: Mood and affect are normal. Speech and  behavior are normal.  ____________________________________________   LABS (all labs ordered are listed, but only abnormal results are displayed)  Results for orders placed or performed during the hospital encounter of 06/13/17 (from the past 24 hour(s))  Lipase, blood     Status: None   Collection Time: 06/13/17  1:47 PM  Result Value Ref Range   Lipase 34 11 - 51 U/L  Comprehensive metabolic panel     Status: Abnormal   Collection Time: 06/13/17  1:47 PM  Result Value Ref Range   Sodium 130 (L) 135 - 145 mmol/L   Potassium 3.8 3.5 - 5.1 mmol/L   Chloride 97 (L) 101 - 111 mmol/L   CO2 27 22 - 32 mmol/L   Glucose, Bld 123 (H) 65 - 99 mg/dL   BUN 29 (H) 6 - 20 mg/dL   Creatinine, Ser 0.89 0.44 - 1.00 mg/dL   Calcium 9.1 8.9 - 10.3 mg/dL   Total Protein 6.7 6.5 - 8.1 g/dL   Albumin 3.2 (L) 3.5 - 5.0 g/dL   AST 24 15 - 41 U/L   ALT 16 14 - 54 U/L   Alkaline Phosphatase  95 38 - 126 U/L   Total Bilirubin 0.6 0.3 - 1.2 mg/dL   GFR calc non Af Amer 57 (L) >60 mL/min   GFR calc Af Amer >60 >60 mL/min   Anion gap 6 5 - 15  CBC     Status: Abnormal   Collection Time: 06/13/17  1:47 PM  Result Value Ref Range   WBC 10.8 3.6 - 11.0 K/uL   RBC 3.87 3.80 - 5.20 MIL/uL   Hemoglobin 12.2 12.0 - 16.0 g/dL   HCT 34.6 (L) 35.0 - 47.0 %   MCV 89.4 80.0 - 100.0 fL   MCH 31.6 26.0 - 34.0 pg   MCHC 35.3 32.0 - 36.0 g/dL   RDW 17.4 (H) 11.5 - 14.5 %   Platelets 258 150 - 440 K/uL   ____________________________________________ ____________________________________________  ____________________________________________   PROCEDURES  Procedure(s) performed:  Procedures    Critical Care performed: no ____________________________________________   INITIAL IMPRESSION / ASSESSMENT AND PLAN / ED COURSE  Pertinent labs & imaging results that were available during my care of the patient were reviewed by me and considered in my medical decision making (see chart for details).  DDX:  dehdyration, hyponatremia,, gastritis, esophagitis  JOANNAH GITLIN is a 83 y.o. who presents to the ED with nausea vomiting and concern for hyponatremia. Patient otherwise well appearing and in no acute distress. Abdominal exam is soft and benign. Breath sounds are clear. No forceful retching to suggest brought pathology such as Boerhaave's. We'll give IV fluids and recheck blood work. Will give IV zofran.  The patient will be placed on continuous pulse oximetry and telemetry for monitoring.  Laboratory evaluation will be sent to evaluate for the above complaints.     Clinical Course as of Jun 14 1743  Sun Jun 13, 2017  1524 Patient states she feels significant improved after IV Zofran. Requesting something to eat and drink. Her blood work here shows an improvement in her hyponatremia to 130. Patient received 1 L bolus.  Will PO trial.  [PR]  1618 Patient is tolerating oral hydration and states her symptoms have completely resolved. At this point due to the patient's appropriate for follow-up with her PCP for repeat blood check.  [PR]    Clinical Course User Index [PR] Merlyn Lot, MD     ____________________________________________   FINAL CLINICAL IMPRESSION(S) / ED DIAGNOSES  Final diagnoses:  Hyponatremia  Nausea and vomiting, intractability of vomiting not specified, unspecified vomiting type      NEW MEDICATIONS STARTED DURING THIS VISIT:  New Prescriptions   No medications on file     Note:  This document was prepared using Dragon voice recognition software and may include unintentional dictation errors.    Merlyn Lot, MD 06/13/17 [phone]

## 2017-06-14 ENCOUNTER — Telehealth: Payer: Self-pay

## 2017-06-14 NOTE — Telephone Encounter (Signed)
  Oncology Nurse Navigator Documentation Received call from daughter Jeani Hawking regarding possible need for additional IVF/labs. Ms. Windhorst was back in the Ed yesterday with N/V and received IVF. Sodium was 130. Called and spoke with Judeen Hammans RN and was notified that Dr. Janese Banks has spoken with Dr. Holley Raring and he is going to see her in the office and determine if sodium level dropping is from dehydration or other causes. Her only intake today is from ice chips. Jeani Hawking reports she is taking Zofran ODT because most pills get stuck and come back up. It has been working for nausea until yesterday. Concern from physician is giving her to much IVF and decreasing sodium further. Once she sees Dr Holley Raring we can bring her in Wednesday for IVF if needed. We spoke regarding using  suppositories for nausea and Jeani Hawking will notify us if she feels she needs them. Jeani Hawking is going to speak with her palliative care NP Magda Paganini as well and arrange a visit. She has heard from Dr. Elwyn Lade office and she has an appointment for 6/19. Navigator Location: [facility] (06/14/17 1300)   )Navigator Encounter Type: Telephone (06/14/17 1300) Telephone: Symptom Mgt (06/14/17 1300)                                                  Time Spent with Patient: 30 (06/14/17 1300)

## 2017-06-15 ENCOUNTER — Telehealth: Payer: Self-pay

## 2017-06-15 DIAGNOSIS — N183 Chronic kidney disease, stage 3 (moderate): Secondary | ICD-10-CM | POA: Diagnosis not present

## 2017-06-15 DIAGNOSIS — N2581 Secondary hyperparathyroidism of renal origin: Secondary | ICD-10-CM | POA: Diagnosis not present

## 2017-06-15 DIAGNOSIS — I10 Essential (primary) hypertension: Secondary | ICD-10-CM | POA: Diagnosis not present

## 2017-06-15 DIAGNOSIS — E871 Hypo-osmolality and hyponatremia: Secondary | ICD-10-CM | POA: Diagnosis not present

## 2017-06-15 NOTE — Telephone Encounter (Signed)
  Oncology Nurse Navigator Documentation Received call from daughter, Jeani Hawking that Brittney Jackson is unable to keep appointment today with Dr. Holley Raring due to "severe diarrhea". She is on the way to her home at this time. Spoke with Dr. Janese Banks and Jeani Hawking was instructed to take her to the ED. She stated if she is confused that is her intent as Dr. Holley Raring thinks this is sodium related. Asked her to keep [place] informed. She has been in contact with Dr. Elwyn Lade office as well. Navigator Location: [facility] (06/15/17 0900)   )Navigator Encounter Type: Telephone (06/15/17 0900) Telephone: Symptom Mgt (06/15/17 0900)                                                  Time Spent with Patient: 15 (06/15/17 0900)

## 2017-06-15 NOTE — Telephone Encounter (Signed)
  Oncology Nurse Navigator Documentation Received voicemail from [place] that she has arrived at Ms. Kewley's home. She states she starting oozing stool around 5:30am. She has no other reported symptoms. No altered LOC. She is going to proceed to appointment with Dr. Holley Raring as scheduled. Navigator Location: [facility] (06/15/17 1000)   )Navigator Encounter Type: Telephone (06/15/17 1000) Telephone: Symptom Mgt (06/15/17 1000)                                                  Time Spent with Patient: 15 (06/15/17 1000)

## 2017-06-18 ENCOUNTER — Other Ambulatory Visit: Payer: Self-pay | Admitting: *Deleted

## 2017-06-18 ENCOUNTER — Inpatient Hospital Stay: Payer: Medicare Other

## 2017-06-18 VITALS — BP 165/49 | HR 82 | Temp 97.6°F | Resp 16

## 2017-06-18 DIAGNOSIS — R918 Other nonspecific abnormal finding of lung field: Secondary | ICD-10-CM | POA: Diagnosis not present

## 2017-06-18 DIAGNOSIS — R1310 Dysphagia, unspecified: Secondary | ICD-10-CM | POA: Diagnosis not present

## 2017-06-18 DIAGNOSIS — C155 Malignant neoplasm of lower third of esophagus: Secondary | ICD-10-CM

## 2017-06-18 DIAGNOSIS — R630 Anorexia: Secondary | ICD-10-CM | POA: Diagnosis not present

## 2017-06-18 DIAGNOSIS — E871 Hypo-osmolality and hyponatremia: Secondary | ICD-10-CM | POA: Diagnosis not present

## 2017-06-18 DIAGNOSIS — N2889 Other specified disorders of kidney and ureter: Secondary | ICD-10-CM | POA: Diagnosis not present

## 2017-06-18 LAB — BASIC METABOLIC PANEL
Anion gap: 7 (ref 5–15)
BUN: 19 mg/dL (ref 6–20)
CALCIUM: 8.7 mg/dL — AB (ref 8.9–10.3)
CHLORIDE: 94 mmol/L — AB (ref 101–111)
CO2: 28 mmol/L (ref 22–32)
CREATININE: 0.78 mg/dL (ref 0.44–1.00)
GFR calc Af Amer: >60 mL/min (ref >60)
GFR calc non Af Amer: >60 mL/min (ref >60)
GLUCOSE: 93 mg/dL (ref 65–99)
Potassium: 3.5 mmol/L (ref 3.5–5.1)
Sodium: 129 mmol/L — ABNORMAL LOW (ref 135–145)

## 2017-06-18 LAB — CBC WITH DIFFERENTIAL/PLATELET
Basophils Absolute: 0 10*3/uL (ref 0–0.1)
Basophils Relative: 1 %
Eosinophils Absolute: 0.1 10*3/uL (ref 0–0.7)
Eosinophils Relative: 2 %
HEMATOCRIT: 32 % — AB (ref 35.0–47.0)
HEMOGLOBIN: 11.4 g/dL — AB (ref 12.0–16.0)
LYMPHS ABS: 0.6 10*3/uL — AB (ref 1.0–3.6)
LYMPHS PCT: 9 %
MCH: 31.4 pg (ref 26.0–34.0)
MCHC: 35.6 g/dL (ref 32.0–36.0)
MCV: 88.1 fL (ref 80.0–100.0)
MONOS PCT: 15 %
Monocytes Absolute: 1.1 10*3/uL — ABNORMAL HIGH (ref 0.2–0.9)
NEUTROS ABS: 5.4 10*3/uL (ref 1.4–6.5)
NEUTROS PCT: 73 %
Platelets: 251 10*3/uL (ref 150–440)
RBC: 3.63 MIL/uL — ABNORMAL LOW (ref 3.80–5.20)
RDW: 17.2 % — AB (ref 11.5–14.5)
WBC: 7.2 10*3/uL (ref 3.6–11.0)

## 2017-06-18 MED ORDER — SODIUM CHLORIDE 0.9 % IV SOLN
Freq: Once | INTRAVENOUS | Status: AC
  Administered 2017-06-18: 14:00:00 via INTRAVENOUS
  Filled 2017-06-18: qty 1000

## 2017-06-21 ENCOUNTER — Encounter: Payer: Self-pay | Admitting: Radiation Oncology

## 2017-06-21 ENCOUNTER — Ambulatory Visit
Admission: RE | Admit: 2017-06-21 | Discharge: 2017-06-21 | Disposition: A | Discharge status: Home / Self Care | Payer: Medicare Other | Source: Ambulatory Visit | Attending: Radiation Oncology | Admitting: Radiation Oncology

## 2017-06-21 ENCOUNTER — Other Ambulatory Visit: Payer: Self-pay

## 2017-06-21 VITALS — BP 152/86 | HR 83 | Temp 98.0°F | Wt 124.3 lb

## 2017-06-21 DIAGNOSIS — Z923 Personal history of irradiation: Secondary | ICD-10-CM | POA: Insufficient documentation

## 2017-06-21 DIAGNOSIS — C155 Malignant neoplasm of lower third of esophagus: Secondary | ICD-10-CM | POA: Insufficient documentation

## 2017-06-21 DIAGNOSIS — R112 Nausea with vomiting, unspecified: Secondary | ICD-10-CM | POA: Insufficient documentation

## 2017-06-21 NOTE — Progress Notes (Signed)
Radiation Oncology Follow up Note  Name: Brittney Jackson   Date:   06/21/2017 MRN:  [id number] DOB: [birth year]    This 83 y.o. female presents to the clinic today for one-month follow-up status post I MRT radiation therapy for. Distal esophageal adenocarcinoma  REFERRING PROVIDER: Crecencio Mc, MD  HPI: Patient is an 83 year old female now out 1 month having completed external beam radiation therapy for a large fungating mass in the lower third esophagus biopsy positive for moderately differentiated adenocarcinoma. She is seen today in routine follow-up is having a lot of problems with swallowing. She claims this is gas. Does sound like obstruction.. She is been having some recurrent episodes of nausea and vomiting prompted taking several ER room visits as well as receiving fluids.  COMPLICATIONS OF TREATMENT: none  FOLLOW UP COMPLIANCE: keeps appointments   PHYSICAL EXAM:  BP (!) 152/86   Pulse 83   Temp 98 F (36.7 C)   Wt 124 lb 5.4 oz (56.4 kg)   BMI 24.28 kg/m  Well-developed well-nourished patient in NAD. HEENT reveals PERLA, EOMI, discs not visualized.  Oral cavity is clear. No oral mucosal lesions are identified. Neck is clear without evidence of cervical or supraclavicular adenopathy. Lungs are clear to A&P. Cardiac examination is essentially unremarkable with regular rate and rhythm without murmur rub or thrill. Abdomen is benign with no organomegaly or masses noted. Motor sensory and DTR levels are equal and symmetric in the upper and lower extremities. Cranial nerves II through XII are grossly intact. Proprioception is intact. No peripheral adenopathy or edema is identified. No motor or sensory levels are noted. Crude visual fields are within normal range.  RADIOLOGY RESULTS: No current films for review  PLAN: Patient scheduled for PET CT scan at the end of July. I've made an appointment tomorrow for her to be seen by GI for possible endoscopy. Patient may need dilatation  of her distal esophagus. Brittney Jackson will get a better idea as to what is going on. She slowly being tapered off her Decadron. I've asked her to resume Carafate swish and swallow 3 times a day. Patient has discontinued that on her own. I've asked to see her back in one week after can be you the results of her upper endoscopy. Brittney Jackson knows to call with any concerns.  I would like to take this opportunity to thank you for allowing me to participate in the care of your patient.Brittney Jackson., MD

## 2017-06-23 ENCOUNTER — Telehealth: Payer: Self-pay | Admitting: *Deleted

## 2017-06-23 ENCOUNTER — Inpatient Hospital Stay: Payer: Medicare Other

## 2017-06-23 VITALS — BP 137/72 | HR 78 | Temp 97.0°F | Resp 16

## 2017-06-23 DIAGNOSIS — C155 Malignant neoplasm of lower third of esophagus: Secondary | ICD-10-CM

## 2017-06-23 DIAGNOSIS — N2889 Other specified disorders of kidney and ureter: Secondary | ICD-10-CM | POA: Diagnosis not present

## 2017-06-23 DIAGNOSIS — R630 Anorexia: Secondary | ICD-10-CM | POA: Diagnosis not present

## 2017-06-23 DIAGNOSIS — R918 Other nonspecific abnormal finding of lung field: Secondary | ICD-10-CM | POA: Diagnosis not present

## 2017-06-23 DIAGNOSIS — R1310 Dysphagia, unspecified: Secondary | ICD-10-CM | POA: Diagnosis not present

## 2017-06-23 DIAGNOSIS — E860 Dehydration: Secondary | ICD-10-CM

## 2017-06-23 DIAGNOSIS — E871 Hypo-osmolality and hyponatremia: Secondary | ICD-10-CM | POA: Diagnosis not present

## 2017-06-23 LAB — BASIC METABOLIC PANEL WITH GFR
Anion gap: 10 (ref 5–15)
BUN: 25 mg/dL — ABNORMAL HIGH (ref 6–20)
CO2: 28 mmol/L (ref 22–32)
Calcium: 9.2 mg/dL (ref 8.9–10.3)
Chloride: 93 mmol/L — ABNORMAL LOW (ref 101–111)
Creatinine, Ser: 1.09 mg/dL — ABNORMAL HIGH (ref 0.44–1.00)
GFR calc Af Amer: 52 mL/min — ABNORMAL LOW (ref >60)
GFR calc non Af Amer: 45 mL/min — ABNORMAL LOW (ref >60)
Glucose, Bld: 176 mg/dL — ABNORMAL HIGH (ref 65–99)
Potassium: 3.4 mmol/L — ABNORMAL LOW (ref 3.5–5.1)
Sodium: 131 mmol/L — ABNORMAL LOW (ref 135–145)

## 2017-06-23 LAB — CBC WITH DIFFERENTIAL/PLATELET
BASOS ABS: 0 10*3/uL (ref 0–0.1)
BASOS PCT: 0 %
EOS ABS: 0 10*3/uL (ref 0–0.7)
Eosinophils Relative: 0 %
HEMATOCRIT: 34.7 % — AB (ref 35.0–47.0)
HEMOGLOBIN: 12.2 g/dL (ref 12.0–16.0)
Lymphocytes Relative: 3 %
Lymphs Abs: 0.2 10*3/uL — ABNORMAL LOW (ref 1.0–3.6)
MCH: 31.3 pg (ref 26.0–34.0)
MCHC: 35 g/dL (ref 32.0–36.0)
MCV: 89.4 fL (ref 80.0–100.0)
Monocytes Absolute: 0.4 10*3/uL (ref 0.2–0.9)
Monocytes Relative: 4 %
NEUTROS ABS: 7.5 10*3/uL — AB (ref 1.4–6.5)
NEUTROS PCT: 93 %
Platelets: 406 10*3/uL (ref 150–440)
RBC: 3.88 MIL/uL (ref 3.80–5.20)
RDW: 16.5 % — ABNORMAL HIGH (ref 11.5–14.5)
WBC: 8.1 10*3/uL (ref 3.6–11.0)

## 2017-06-23 MED ORDER — SODIUM CHLORIDE 0.9% FLUSH
10.0000 mL | INTRAVENOUS | Status: DC | PRN
  Filled 2017-06-23: qty 10

## 2017-06-23 MED ORDER — SODIUM CHLORIDE 0.9 % IV SOLN
Freq: Once | INTRAVENOUS | Status: AC
  Administered 2017-06-23: 14:00:00 via INTRAVENOUS
  Filled 2017-06-23: qty 4

## 2017-06-23 MED ORDER — SODIUM CHLORIDE 0.9 % IV SOLN
Freq: Once | INTRAVENOUS | Status: AC
  Administered 2017-06-23: 14:00:00 via INTRAVENOUS
  Filled 2017-06-23: qty 1000

## 2017-06-23 MED ORDER — HEPARIN SOD (PORK) LOCK FLUSH 100 UNIT/ML IV SOLN: 500.0000 [IU] | Freq: Once | INTRAVENOUS | Status: DC | PRN

## 2017-06-23 NOTE — Telephone Encounter (Signed)
daughter called-requested to speak to [place], Therapist, sports.  Pt extremely weak. Daughter states that pt is not eating/drinking. Has an upcoming endoscopy scheduled this Friday. Daughter voices concerns that pt "may end up in ED by the evening due to extreme weakness if IV fluids are not given today."  RN spoke with Dr. Rogue Bussing- md approved. Cbc, metb to be drawn today and IV fluids as scheduled.  Proceed with standing orders in [facility] for Iv fluids per md.

## 2017-06-25 ENCOUNTER — Ambulatory Visit
Admission: RE | Admit: 2017-06-25 | Discharge: 2017-06-25 | Disposition: A | Discharge status: Home / Self Care | Payer: Medicare Other | Source: Ambulatory Visit | Attending: Gastroenterology | Admitting: Gastroenterology

## 2017-06-25 ENCOUNTER — Inpatient Hospital Stay: Payer: Medicare Other

## 2017-06-25 ENCOUNTER — Ambulatory Visit: Payer: Medicare Other | Admitting: Anesthesiology

## 2017-06-25 ENCOUNTER — Encounter
Admission: RE | Disposition: A | Discharge status: Home / Self Care | Payer: Self-pay | Source: Ambulatory Visit | Attending: Gastroenterology

## 2017-06-25 DIAGNOSIS — Z8501 Personal history of malignant neoplasm of esophagus: Secondary | ICD-10-CM | POA: Diagnosis not present

## 2017-06-25 DIAGNOSIS — I10 Essential (primary) hypertension: Secondary | ICD-10-CM | POA: Diagnosis not present

## 2017-06-25 DIAGNOSIS — N289 Disorder of kidney and ureter, unspecified: Secondary | ICD-10-CM | POA: Insufficient documentation

## 2017-06-25 DIAGNOSIS — I129 Hypertensive chronic kidney disease with stage 1 through stage 4 chronic kidney disease, or unspecified chronic kidney disease: Secondary | ICD-10-CM | POA: Diagnosis not present

## 2017-06-25 DIAGNOSIS — Z7982 Long term (current) use of aspirin: Secondary | ICD-10-CM | POA: Insufficient documentation

## 2017-06-25 DIAGNOSIS — Z79899 Other long term (current) drug therapy: Secondary | ICD-10-CM | POA: Diagnosis not present

## 2017-06-25 DIAGNOSIS — E039 Hypothyroidism, unspecified: Secondary | ICD-10-CM | POA: Diagnosis not present

## 2017-06-25 DIAGNOSIS — Z9842 Cataract extraction status, left eye: Secondary | ICD-10-CM | POA: Insufficient documentation

## 2017-06-25 DIAGNOSIS — Z87891 Personal history of nicotine dependence: Secondary | ICD-10-CM | POA: Insufficient documentation

## 2017-06-25 DIAGNOSIS — M810 Age-related osteoporosis without current pathological fracture: Secondary | ICD-10-CM | POA: Insufficient documentation

## 2017-06-25 DIAGNOSIS — K228 Other specified diseases of esophagus: Secondary | ICD-10-CM | POA: Diagnosis not present

## 2017-06-25 DIAGNOSIS — R1310 Dysphagia, unspecified: Secondary | ICD-10-CM | POA: Diagnosis not present

## 2017-06-25 DIAGNOSIS — M1712 Unilateral primary osteoarthritis, left knee: Secondary | ICD-10-CM | POA: Diagnosis not present

## 2017-06-25 DIAGNOSIS — Z8051 Family history of malignant neoplasm of kidney: Secondary | ICD-10-CM | POA: Diagnosis not present

## 2017-06-25 DIAGNOSIS — N189 Chronic kidney disease, unspecified: Secondary | ICD-10-CM | POA: Diagnosis not present

## 2017-06-25 DIAGNOSIS — Z9841 Cataract extraction status, right eye: Secondary | ICD-10-CM | POA: Diagnosis not present

## 2017-06-25 DIAGNOSIS — C155 Malignant neoplasm of lower third of esophagus: Secondary | ICD-10-CM | POA: Diagnosis not present

## 2017-06-25 DIAGNOSIS — Z8249 Family history of ischemic heart disease and other diseases of the circulatory system: Secondary | ICD-10-CM | POA: Insufficient documentation

## 2017-06-25 DIAGNOSIS — K449 Diaphragmatic hernia without obstruction or gangrene: Secondary | ICD-10-CM | POA: Insufficient documentation

## 2017-06-25 DIAGNOSIS — K222 Esophageal obstruction: Secondary | ICD-10-CM | POA: Insufficient documentation

## 2017-06-25 HISTORY — PX: ESOPHAGOGASTRODUODENOSCOPY (EGD) WITH PROPOFOL: SHX5813

## 2017-06-25 SURGERY — ESOPHAGOGASTRODUODENOSCOPY (EGD) WITH PROPOFOL
Anesthesia: General

## 2017-06-25 MED ORDER — PROPOFOL 10 MG/ML IV BOLUS
INTRAVENOUS | Status: DC | PRN
  Administered 2017-06-25: 50 mg via INTRAVENOUS

## 2017-06-25 MED ORDER — ONDANSETRON HCL 4 MG/2ML IJ SOLN
INTRAMUSCULAR | Status: DC | PRN
  Administered 2017-06-25: 4 mg via INTRAVENOUS

## 2017-06-25 MED ORDER — SODIUM CHLORIDE 0.9 % IV SOLN
INTRAVENOUS | Status: DC
  Administered 2017-06-25: 11:00:00 via INTRAVENOUS

## 2017-06-25 MED ORDER — LIDOCAINE 2% (20 MG/ML) 5 ML SYRINGE
INTRAMUSCULAR | Status: DC | PRN
  Administered 2017-06-25: 60 mg via INTRAVENOUS

## 2017-06-25 MED ORDER — PROPOFOL 500 MG/50ML IV EMUL
INTRAVENOUS | Status: DC | PRN
  Administered 2017-06-25: 150 ug/kg/min via INTRAVENOUS

## 2017-06-25 NOTE — Anesthesia Preprocedure Evaluation (Signed)
Anesthesia Evaluation  Patient identified by MRN, date of birth, ID band Patient awake    Reviewed: Allergy & Precautions, NPO status , Patient's Chart, lab work & pertinent test results, reviewed documented beta blocker date and time   Airway Mallampati: II  TM Distance: >3 FB     Dental  (+) Chipped   Pulmonary former smoker,           Cardiovascular hypertension, Pt. on medications and Pt. on home beta blockers      Neuro/Psych    GI/Hepatic   Endo/Other  Hypothyroidism   Renal/GU Renal InsufficiencyRenal disease     Musculoskeletal  (+) Arthritis ,   Abdominal   Peds  Hematology   Anesthesia Other Findings   Reproductive/Obstetrics                             Anesthesia Physical Anesthesia Plan  ASA: III  Anesthesia Plan: General   Post-op Pain Management:    Induction: Intravenous  PONV Risk Score and Plan:   Airway Management Planned:   Additional Equipment:   Intra-op Plan:   Post-operative Plan:   Informed Consent: I have reviewed the patients History and Physical, chart, labs and discussed the procedure including the risks, benefits and alternatives for the proposed anesthesia with the patient or authorized representative who has indicated his/her understanding and acceptance.     Plan Discussed with: CRNA  Anesthesia Plan Comments:         Anesthesia Quick Evaluation

## 2017-06-25 NOTE — Op Note (Signed)
[facility] Gastroenterology Patient Name: Brittney Jackson Procedure Date: 06/25/2017 10:50 AM MRN: [id number] Account #: [id number] Date of Birth: December 14, 1930 Admit Type: Outpatient Age: 83 Room: [facility] ENDO ROOM 4 Gender: Female Note Status: Finalized Procedure:            Upper GI endoscopy Indications:          Dysphagia Providers:            Jonathon Bellows MD, MD Referring MD:         Deborra Medina, MD (Referring MD) Medicines:            Monitored Anesthesia Care Complications:        No immediate complications. Procedure:            Pre-Anesthesia Assessment:                       - Prior to the procedure, a History and Physical was                        performed, and patient medications, allergies and                        sensitivities were reviewed. The patient's tolerance of                        previous anesthesia was reviewed.                       - The risks and benefits of the procedure and the                        sedation options and risks were discussed with the                        patient. All questions were answered and informed                        consent was obtained.                       - ASA Grade Assessment: III - A patient with severe                        systemic disease.                       After obtaining informed consent, the endoscope was                        passed under direct vision. Throughout the procedure,                        the patient's blood pressure, pulse, and oxygen                        saturations were monitored continuously. The Endoscope                        was introduced through the mouth, and advanced to the  third part of duodenum. The upper GI endoscopy was                        technically difficult and complex due to a partially                        obstructing mass. Successful completion of the                        procedure was aided by withdrawing the scope and                         replacing with the pediatric endoscope. The patient                        tolerated the procedure well. Findings:      The examined duodenum was normal.      A medium-sized hiatal hernia was present.      One severe malignant-appearing, intrinsic stenosis was found 30 to 34 cm       from the incisors. This measured 8 mm (inner diameter) x 4 cm (in       length) and was [Traversed] after changing to a narrower caliber       pediatric upper endoscope Impression:           - Normal examined duodenum.                       - Medium-sized hiatal hernia.                       - Malignant-appearing esophageal stenosis.                       - No specimens collected. Recommendation:       - Discharge patient to home (with escort).                       - Full liquid diet.                       - Continue present medications.                       - 1. The location of the lesion would not permit                        placement of an esophageal stent as the mass extends                        all the way to the junction with the hiatal hernia and                        there would be no mucosa for the stent to anchor                        distally.                       2. In terms of nutrition she can undergo an IR guided G  tube placement so that she can have adequate nutrition                        in addition to PO liquids. G tube is an option if                        radiation is felt is not helping her. Procedure Code(s):    --- Professional ---                       [phone], Esophagogastroduodenoscopy, flexible, transoral;                        diagnostic, including collection of specimen(s) by                        brushing or washing, when performed (separate procedure) Diagnosis Code(s):    --- Professional ---                       K44.9, Diaphragmatic hernia without obstruction or                        gangrene                        K22.2, Esophageal obstruction                       R13.10, Dysphagia, unspecified CPT copyright 2016 American Medical Association. All rights reserved. The codes documented in this report are preliminary and upon coder review may  be revised to meet current compliance requirements. Jonathon Bellows, MD Jonathon Bellows MD, MD 06/25/2017 11:13:45 AM This report has been signed electronically. Number of Addenda: 0 Note Initiated On: 06/25/2017 10:50 AM      [facility]

## 2017-06-25 NOTE — Transfer of Care (Signed)
Immediate Anesthesia Transfer of Care Note  Patient: Brittney Jackson  Procedure(s) Performed: Procedure(s): ESOPHAGOGASTRODUODENOSCOPY (EGD) WITH PROPOFOL (N/A)  Patient Location: Endoscopy Unit  Anesthesia Type:General  Level of Consciousness: sedated  Airway & Oxygen Therapy: Patient connected to nasal cannula oxygen  Post-op Assessment: Post -op Vital signs reviewed and stable  Post vital signs: stable  Last Vitals:  Vitals:   06/25/17 1113 06/25/17 1115  BP:  (!) 108/47  Pulse: 79 81  Resp: 17 16  Temp: 36.3 C 36.3 C    Last Pain:  Vitals:   06/25/17 1115  TempSrc: Tympanic         Complications: No apparent anesthesia complications

## 2017-06-25 NOTE — Anesthesia Postprocedure Evaluation (Signed)
Anesthesia Post Note  Patient: Brittney Jackson  Procedure(s) Performed: Procedure(s) (LRB): ESOPHAGOGASTRODUODENOSCOPY (EGD) WITH PROPOFOL (N/A)  Patient location during evaluation: Endoscopy Anesthesia Type: General Level of consciousness: awake and alert Pain management: pain level controlled Vital Signs Assessment: post-procedure vital signs reviewed and stable Respiratory status: spontaneous breathing, nonlabored ventilation, respiratory function stable and patient connected to nasal cannula oxygen Cardiovascular status: blood pressure returned to baseline and stable Postop Assessment: no signs of nausea or vomiting Anesthetic complications: no     Last Vitals:  Vitals:   06/25/17 1153 06/25/17 1203  BP: (!) 173/92 (!) 163/95  Pulse: 74 78  Resp: 12 19  Temp:      Last Pain:  Vitals:   06/25/17 1115  TempSrc: Tympanic                 [place] S

## 2017-06-25 NOTE — H&P (Signed)
Jonathon Bellows MD [address], [place], [place] [address] Phone: [phone] Fax : [phone]  Primary Care Physician:  Crecencio Mc, MD Primary Gastroenterologist:  Dr. Jonathon Bellows   Pre-Procedure History & Physical: HPI:  Brittney Jackson is a 83 y.o. female is here for an endoscopy.   Past Medical History:  Diagnosis Date  . Arthritis    left knee  . Cancer ([facility])    esophageal cancer  . Function kidney decreased   . Hypertension   . Hypothyroidism   . Inappropriate ADH secretion (HCC)   . Low sodium levels   . Osteoporosis   . Renal disorder     Past Surgical History:  Procedure Laterality Date  . CATARACT EXTRACTION W/PHACO Left 05/29/2015   Procedure: CATARACT EXTRACTION PHACO AND INTRAOCULAR LENS PLACEMENT (IOC);  Surgeon: Leandrew Koyanagi, MD;  Location: [facility];  Service: Ophthalmology;  Laterality: Left;  . CATARACT EXTRACTION W/PHACO Right 01/15/2016   Procedure: CATARACT EXTRACTION PHACO AND INTRAOCULAR LENS PLACEMENT (IOC);  Surgeon: Leandrew Koyanagi, MD;  Location: [facility];  Service: Ophthalmology;  Laterality: Right;  SHUGARCAINE  . ESOPHAGOGASTRODUODENOSCOPY (EGD) WITH PROPOFOL N/A 03/16/2017   Procedure: ESOPHAGOGASTRODUODENOSCOPY (EGD) WITH PROPOFOL;  Surgeon: [name], MD;  Location: [facility];  Service: Endoscopy;  Laterality: N/A;  . TONSILLECTOMY      Prior to Admission medications   Medication Sig Start Date End Date Taking? Authorizing Provider  dexamethasone (DECADRON) 4 MG tablet Take 1 tablet (4 mg total) by mouth 2 (two) times daily. 05/28/17  Yes Cammie Sickle, MD  HYDROcodone-acetaminophen (NORCO) 5-325 MG tablet Take 1 tablet by mouth every 6 (six) hours as needed for moderate pain. 06/11/17  Yes Sindy Guadeloupe, MD  labetalol (NORMODYNE) 300 MG tablet Take 0.5 tablets (150 mg total) by mouth 3 (three) times daily. 1/2 tab (150 mg) Am and PM Patient taking differently: Take 150 mg by mouth 2  (two) times daily. 1/2 tab (150 mg) Am and PM 02/28/16  Yes Crecencio Mc, MD  levothyroxine (SYNTHROID, LEVOTHROID) 88 MCG tablet TAKE ONE (1) TABLET EACH DAY 05/19/17  Yes Crecencio Mc, MD  ondansetron (ZOFRAN ODT) 4 MG disintegrating tablet Take 1 tablet (4 mg total) by mouth every 8 (eight) hours as needed for nausea or vomiting. 06/13/17  Yes Merlyn Lot, MD  sucralfate (CARAFATE) 1 g tablet Take 1 tablet (1 g total) by mouth 4 (four) times daily -  with meals and at bedtime. Dissolve in warm water, swish and swallow 04/06/17  Yes Chrystal, Eulas Post, MD  aspirin 81 MG tablet Take 81 mg by mouth daily.    [provider]  cholecalciferol (VITAMIN D) 1000 UNITS tablet Take 1,000 Units by mouth daily.    [provider]  losartan (COZAAR) 100 MG tablet TAKE ONE (1) TABLET BY MOUTH EVERY DAY Patient not taking: Reported on 05/28/2017 12/23/16   Crecencio Mc, MD  Multiple Vitamins-Minerals (PRESERVISION AREDS PO) Take by mouth daily.    [provider]  ondansetron (ZOFRAN ODT) 4 MG disintegrating tablet Allow 1-2 tablets to dissolve in your mouth every 8 hours as needed for nausea/vomiting Patient not taking: Reported on 03/19/2017 03/14/17   Hinda Kehr, MD  simvastatin (ZOCOR) 40 MG tablet TAKE ONE (1) TABLET AT BEDTIME Patient not taking: Reported on 03/19/2017 09/14/16   Crecencio Mc, MD    Allergies as of 06/21/2017 - Review Complete 06/21/2017  Allergen Reaction Noted  . Amlodipine Swelling 01/09/2014  Family History  Problem Relation Age of Onset  . Heart attack Mother   . Cancer Maternal Uncle        renal cancer   . COPD Neg Hx        no breast, ovarian or colon    Social History   Social History  . Marital status: Widowed    Spouse name: N/A  . Number of children: N/A  . Years of education: N/A   Occupational History  . 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 Smoking status: Former  Smoker    Types: Cigarettes    Quit date: 07/21/2000  . Smokeless tobacco: Never Used  . Alcohol use No  . Drug use: No  . Sexual activity: No   Other Topics Concern  . Not on file   Social History Narrative   Church activities, community service.   Lives alone.    Review of Systems: See HPI, otherwise negative ROS  Physical Exam: BP (!) 147/56   Pulse 96   Temp 97.1 F (36.2 C) (Tympanic)   Resp 16   Ht 5\' 1"  (1.549 m)   Wt 124 lb (56.2 kg)   SpO2 99%   BMI 23.43 kg/m  General:   Alert,  pleasant and cooperative in NAD Head:  Normocephalic and atraumatic. Neck:  Supple; no masses or thyromegaly. Lungs:  Clear throughout to auscultation.    Heart:  Regular rate and rhythm. Abdomen:  Soft, nontender and nondistended. Normal bowel sounds, without guarding, and without rebound.   Neurologic:  Alert and  oriented x4;  grossly normal neurologically.  Impression/Plan: Brittney Jackson is here for an endoscopy to be performed for dysphagia with a prior history of esophageal cancer   Risks, benefits, limitations, and alternatives regarding  endoscopy have been reviewed with the patient.  Questions have been answered.  All parties agreeable.   Jonathon Bellows, MD  06/25/2017, 10:50 AM

## 2017-06-25 NOTE — Anesthesia Post-op Follow-up Note (Cosign Needed)
Anesthesia QCDR form completed.        

## 2017-06-25 NOTE — OR Nursing (Signed)
PT TO PREOP. DX NEOPLASM OF ESOPHAGUS. REPORTS RECEIVED 1 LITER OF NS QMWF AND THE ENDO NEEDS TO COORDINATE WITH CANCER CENTER.Marland Kitchen RN  SPOKE WITH KIM DAWKINS ,RN THAT PT NEEDS TO RECEIVED  THE ENTIRE LITER OF NS HUNG IN PREOP.. IV NS INITIATED GENTLY( 100). POSTOP RN ROBIN INFORMED OF NEEDING TO RECEIVE ENTIRE BAG OF FLUIDS

## 2017-06-25 NOTE — Progress Notes (Signed)
Procedure recovery completed. Awaiting 500 ml of 100 mo NS to infuse and then will discharge

## 2017-06-28 ENCOUNTER — Encounter: Payer: Self-pay | Admitting: Gastroenterology

## 2017-06-29 ENCOUNTER — Telehealth: Payer: Self-pay | Admitting: Internal Medicine

## 2017-06-29 ENCOUNTER — Encounter: Payer: Self-pay | Admitting: Radiation Oncology

## 2017-06-29 ENCOUNTER — Ambulatory Visit
Admission: RE | Admit: 2017-06-29 | Discharge: 2017-06-29 | Disposition: A | Discharge status: Home / Self Care | Payer: Medicare Other | Source: Ambulatory Visit | Attending: Radiation Oncology | Admitting: Radiation Oncology

## 2017-06-29 ENCOUNTER — Telehealth: Payer: Self-pay | Admitting: *Deleted

## 2017-06-29 ENCOUNTER — Other Ambulatory Visit: Payer: Self-pay | Admitting: *Deleted

## 2017-06-29 VITALS — BP 155/83 | HR 93 | Temp 96.2°F | Wt 122.7 lb

## 2017-06-29 DIAGNOSIS — R1310 Dysphagia, unspecified: Secondary | ICD-10-CM | POA: Insufficient documentation

## 2017-06-29 DIAGNOSIS — R634 Abnormal weight loss: Secondary | ICD-10-CM | POA: Insufficient documentation

## 2017-06-29 DIAGNOSIS — Z87891 Personal history of nicotine dependence: Secondary | ICD-10-CM | POA: Diagnosis not present

## 2017-06-29 DIAGNOSIS — R51 Headache: Secondary | ICD-10-CM | POA: Diagnosis not present

## 2017-06-29 DIAGNOSIS — M549 Dorsalgia, unspecified: Secondary | ICD-10-CM | POA: Diagnosis not present

## 2017-06-29 DIAGNOSIS — Z923 Personal history of irradiation: Secondary | ICD-10-CM | POA: Diagnosis not present

## 2017-06-29 DIAGNOSIS — C155 Malignant neoplasm of lower third of esophagus: Secondary | ICD-10-CM | POA: Insufficient documentation

## 2017-06-29 MED ORDER — AZITHROMYCIN 250 MG PO TABS: ORAL_TABLET | ORAL | 0 refills | Status: DC

## 2017-06-29 NOTE — Telephone Encounter (Signed)
md returned phone call.

## 2017-06-29 NOTE — Telephone Encounter (Signed)
Spoke to patient's daughter Jeani Hawking- reviewed the EGD; recommend keep appt as planned on 7/05;   Please MOVE the PET scan to as soon as possible/? Early next week.

## 2017-06-29 NOTE — Telephone Encounter (Signed)
msg sent to sch. Team to arrange. 

## 2017-06-29 NOTE — Telephone Encounter (Signed)
-----   Message from Christean Grief, RN sent at 06/29/2017  1:44 PM EDT ----- Regarding: Patient concerns Patient and daughter have concerns regarding Pain medication is not working, the PET is not scheduled until the end of the month; and that they will not be able to bring Dr. Jacinto Reap. Up to speed on everything until the end of the month. She had an EGD on Friday and was told there are not changes since before treatment.  Please call and speak with the daughter regarding these items.   Thanks,  Maudie Mercury

## 2017-06-29 NOTE — Progress Notes (Signed)
Radiation Oncology Follow up Note  Name: Brittney Jackson   Date:   06/29/2017 MRN:  [id number] DOB: Nov 24, 1930    This 83 y.o. female presents to the clinic today for follow-up results on recent upper endoscopy for distal esophageal adenocarcinoma.  REFERRING PROVIDER: Crecencio Mc, MD  HPI: Patient is an 83 year old female now out proximally month and a half having completed external beam radiation therapy for large fungating mass in the lower third of the esophagus biopsy positive for moderately differentiated adenocarcinoma. She is slowly losing weight is having problems with dysphagia. We sent her for endoscopy. According to the report there was "malignant stricture in the distal esophagus although on photos I see no evidence of mass in the distal third of the esophagus. No biopsies were obtained no dilatation was performed. Patient and daughter seen today they're requesting moving up her PET CT scan from the end of the month. She's also been having significant back pain and they want to rule out possibility of metastatic disease to her spine..  COMPLICATIONS OF TREATMENT: none  FOLLOW UP COMPLIANCE: keeps appointments   PHYSICAL EXAM:  BP (!) 155/83   Pulse 93   Temp (!) 96.2 F (35.7 C)   Wt 122 lb 11 oz (55.7 kg)   BMI 23.18 kg/m  Deep palpation of her spine does not elicit pain. Motor sensory and DTR levels are equal and symmetric in the upper lower extremities. Well-developed well-nourished patient in NAD. HEENT reveals PERLA, EOMI, discs not visualized.  Oral cavity is clear. No oral mucosal lesions are identified. Neck is clear without evidence of cervical or supraclavicular adenopathy. Lungs are clear to A&P. Cardiac examination is essentially unremarkable with regular rate and rhythm without murmur rub or thrill. Abdomen is benign with no organomegaly or masses noted. Motor sensory and DTR levels are equal and symmetric in the upper and lower extremities. Cranial nerves II  through XII are grossly intact. Proprioception is intact. No peripheral adenopathy or edema is identified. No motor or sensory levels are noted. Crude visual fields are within normal range.  RADIOLOGY RESULTS: Previous PET scan reviewed  PLAN: At this time of move the upper point with apical oncology. I have talked to him about placing a feeding tube which was a recommendation of GI. Really not see any viable tumor I be interested in seeing what PET CT scan shows at this time. I've also will talk to medical oncology about her pain medications. She's also been having pain in her right face from a chronic sinus problem on putting her on a Z-Pak. Will follow-up and discuss case later this week with medical oncology.  I would like to take this opportunity to thank you for allowing me to participate in the care of your patient.Armstead Peaks., MD

## 2017-07-01 ENCOUNTER — Encounter: Payer: Self-pay | Admitting: *Deleted

## 2017-07-01 ENCOUNTER — Other Ambulatory Visit: Payer: Self-pay | Admitting: *Deleted

## 2017-07-01 ENCOUNTER — Inpatient Hospital Stay: Payer: Medicare Other | Attending: Internal Medicine

## 2017-07-01 ENCOUNTER — Telehealth: Payer: Self-pay | Admitting: *Deleted

## 2017-07-01 ENCOUNTER — Inpatient Hospital Stay (HOSPITAL_BASED_OUTPATIENT_CLINIC_OR_DEPARTMENT_OTHER): Payer: Medicare Other | Admitting: Internal Medicine

## 2017-07-01 VITALS — BP 155/81 | HR 86 | Temp 97.8°F | Resp 18 | Ht 61.0 in | Wt 120.0 lb

## 2017-07-01 DIAGNOSIS — I10 Essential (primary) hypertension: Secondary | ICD-10-CM | POA: Insufficient documentation

## 2017-07-01 DIAGNOSIS — M25471 Effusion, right ankle: Secondary | ICD-10-CM | POA: Insufficient documentation

## 2017-07-01 DIAGNOSIS — Z87891 Personal history of nicotine dependence: Secondary | ICD-10-CM | POA: Insufficient documentation

## 2017-07-01 DIAGNOSIS — Z79899 Other long term (current) drug therapy: Secondary | ICD-10-CM | POA: Insufficient documentation

## 2017-07-01 DIAGNOSIS — R109 Unspecified abdominal pain: Secondary | ICD-10-CM | POA: Diagnosis not present

## 2017-07-01 DIAGNOSIS — R634 Abnormal weight loss: Secondary | ICD-10-CM | POA: Diagnosis not present

## 2017-07-01 DIAGNOSIS — M129 Arthropathy, unspecified: Secondary | ICD-10-CM

## 2017-07-01 DIAGNOSIS — M25472 Effusion, left ankle: Secondary | ICD-10-CM | POA: Diagnosis not present

## 2017-07-01 DIAGNOSIS — E860 Dehydration: Secondary | ICD-10-CM | POA: Diagnosis not present

## 2017-07-01 DIAGNOSIS — N2889 Other specified disorders of kidney and ureter: Secondary | ICD-10-CM | POA: Insufficient documentation

## 2017-07-01 DIAGNOSIS — Z923 Personal history of irradiation: Secondary | ICD-10-CM | POA: Insufficient documentation

## 2017-07-01 DIAGNOSIS — E222 Syndrome of inappropriate secretion of antidiuretic hormone: Secondary | ICD-10-CM | POA: Insufficient documentation

## 2017-07-01 DIAGNOSIS — E876 Hypokalemia: Secondary | ICD-10-CM | POA: Diagnosis not present

## 2017-07-01 DIAGNOSIS — K5792 Diverticulitis of intestine, part unspecified, without perforation or abscess without bleeding: Secondary | ICD-10-CM | POA: Diagnosis not present

## 2017-07-01 DIAGNOSIS — E039 Hypothyroidism, unspecified: Secondary | ICD-10-CM

## 2017-07-01 DIAGNOSIS — M549 Dorsalgia, unspecified: Secondary | ICD-10-CM

## 2017-07-01 DIAGNOSIS — R531 Weakness: Secondary | ICD-10-CM | POA: Diagnosis not present

## 2017-07-01 DIAGNOSIS — R1310 Dysphagia, unspecified: Secondary | ICD-10-CM

## 2017-07-01 DIAGNOSIS — R197 Diarrhea, unspecified: Secondary | ICD-10-CM | POA: Diagnosis not present

## 2017-07-01 DIAGNOSIS — R5383 Other fatigue: Secondary | ICD-10-CM | POA: Diagnosis not present

## 2017-07-01 DIAGNOSIS — Z9221 Personal history of antineoplastic chemotherapy: Secondary | ICD-10-CM

## 2017-07-01 DIAGNOSIS — R911 Solitary pulmonary nodule: Secondary | ICD-10-CM | POA: Diagnosis not present

## 2017-07-01 DIAGNOSIS — C155 Malignant neoplasm of lower third of esophagus: Secondary | ICD-10-CM | POA: Diagnosis not present

## 2017-07-01 DIAGNOSIS — E871 Hypo-osmolality and hyponatremia: Secondary | ICD-10-CM | POA: Insufficient documentation

## 2017-07-01 DIAGNOSIS — M818 Other osteoporosis without current pathological fracture: Secondary | ICD-10-CM

## 2017-07-01 DIAGNOSIS — Z8051 Family history of malignant neoplasm of kidney: Secondary | ICD-10-CM | POA: Insufficient documentation

## 2017-07-01 DIAGNOSIS — R918 Other nonspecific abnormal finding of lung field: Secondary | ICD-10-CM | POA: Diagnosis not present

## 2017-07-01 LAB — CBC WITH DIFFERENTIAL/PLATELET
BASOS PCT: 1 %
Basophils Absolute: 0.1 10*3/uL (ref 0–0.1)
Eosinophils Absolute: 0.1 10*3/uL (ref 0–0.7)
Eosinophils Relative: 1 %
HEMATOCRIT: 33.5 % — AB (ref 35.0–47.0)
Hemoglobin: 11.9 g/dL — ABNORMAL LOW (ref 12.0–16.0)
LYMPHS ABS: 0.8 10*3/uL — AB (ref 1.0–3.6)
Lymphocytes Relative: 11 %
MCH: 31.4 pg (ref 26.0–34.0)
MCHC: 35.7 g/dL (ref 32.0–36.0)
MCV: 88.1 fL (ref 80.0–100.0)
MONO ABS: 1.1 10*3/uL — AB (ref 0.2–0.9)
MONOS PCT: 14 %
NEUTROS ABS: 5.4 10*3/uL (ref 1.4–6.5)
Neutrophils Relative %: 73 %
Platelets: 369 10*3/uL (ref 150–440)
RBC: 3.8 MIL/uL (ref 3.80–5.20)
RDW: 16.6 % — AB (ref 11.5–14.5)
WBC: 7.4 10*3/uL (ref 3.6–11.0)

## 2017-07-01 LAB — BASIC METABOLIC PANEL
Anion gap: 10 (ref 5–15)
BUN: 18 mg/dL (ref 6–20)
CALCIUM: 9.1 mg/dL (ref 8.9–10.3)
CHLORIDE: 92 mmol/L — AB (ref 101–111)
CO2: 28 mmol/L (ref 22–32)
CREATININE: 0.96 mg/dL (ref 0.44–1.00)
GFR calc non Af Amer: 52 mL/min — ABNORMAL LOW (ref >60)
GLUCOSE: 104 mg/dL — AB (ref 65–99)
Potassium: 3.1 mmol/L — ABNORMAL LOW (ref 3.5–5.1)
Sodium: 130 mmol/L — ABNORMAL LOW (ref 135–145)

## 2017-07-01 LAB — AMYLASE: Amylase: 45 U/L (ref 28–100)

## 2017-07-01 MED ORDER — POTASSIUM CHLORIDE CRYS ER 20 MEQ PO TBCR: 20.0000 meq | EXTENDED_RELEASE_TABLET | Freq: Every day | ORAL | 0 refills | Status: DC

## 2017-07-01 MED ORDER — POTASSIUM CHLORIDE 20 MEQ/15ML (10%) PO SOLN: 20.0000 meq | Freq: Every day | ORAL | 0 refills | Status: DC

## 2017-07-01 NOTE — Telephone Encounter (Signed)
md made aware.

## 2017-07-01 NOTE — Assessment & Plan Note (Addendum)
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 PET- positive paraesophgeal uptake.Txn1- stage III. S/p  concurrent chemoradiation- carbo-taxol weekly [finished May 21st].   # Status post chemoradiation [May 21]- with worsening back pain; question worsening malignancy. Get a PET scan.   # back pain- ? Etiology- recommend continue the hydrocodone; await PET scan.   # Weight loss/difficulty swallowing- today 130 [prior history of severe hyponatremia]- check labs today. Refer back to dietician. Discussed regarding feeding tube. Patient reluctant. Reviewed the EGD with the patient.   # follow up few days after the PET. Get labs today; lipase; amylase; labs/IVFs/ MD visit in 1 week. Discussed with Dr.Crystal.

## 2017-07-01 NOTE — Progress Notes (Signed)
Pt reports lumbar back pain daily x 3 weeks. Pain continues to escalate and her back pain "worsened" last Saturday through yesterday. She reports having to take the norco tablet every 4 to 6 hrs to control the pain. She declines back pain today. Patient denies any falls. She reports intermittent nausea episodes- used zofran this weekend.

## 2017-07-01 NOTE — Telephone Encounter (Signed)
-----   Message from [place] sent at 07/01/2017 10:42 AM EDT ----- Patient wanted to hold off on appt with Joli, Thanks!

## 2017-07-01 NOTE — Progress Notes (Signed)
[facility] CONSULT NOTE  Patient Care Team: Crecencio Mc, MD as PCP - General (Internal Medicine) Clent Jacks, RN as Registered Nurse  CHIEF COMPLAINTS/PURPOSE OF CONSULTATION: Esophageal cancer  #  Oncology History   # MARCH 2018- ADENO CA; mod diff [lower third of esophagus; EGD Bx; Dr.Wohl]; CT- A/P- no distant mets/LN. PET- TxN1; no EUS.  # April 5th 2018- Carbo-taxol with RT [until May 22nd 2018]  # March 2018- LEFT KIDNEY COMPLEX MASS ~2cm [incidental]; ~ 2 cm left lower lobe nodule [question second primary]  # Hx of Hyponatremia [~[phone]; Dr.Lateef]  # MOLECULAR TESTING- Her 2 neu-NEGATIVE; PDL-1- POSITIVE [CPS-2]; MMR- STABLE.      Malignant neoplasm of lower third of esophagus (HCC)     HISTORY OF PRESENTING ILLNESS:  Brittney Jackson 83 y.o.  female diagnosed adenocarcinoma the Lower esophagus currently s/p  chemoradiation is here for follow-up [finished RT on may 21st] Is here for follow-up given her worsening back pain.  Patient states that she has intermittent intense back pain mid back- 4 which needs to take hydrocodone without much relief. However today she she feels okay.   She also complains of difficulty swallowing/pain while swallowing with regurgitation. She has lost weight. Patient admitted fluids almost every week in the last few weeks.  Denies any tingling or numbness.  No chest pain or shortness of breath or cough.   ROS: A complete 10 point review of system is done which is negative except mentioned above in history of present illness  MEDICAL HISTORY:  Past Medical History:  Diagnosis Date  . Arthritis    left knee  . Cancer ([facility])    esophageal cancer  . Function kidney decreased   . Hypertension   . Hypothyroidism   . Inappropriate ADH secretion (HCC)   . Low sodium levels   . Osteoporosis   . Renal disorder     SURGICAL HISTORY: Past Surgical History:  Procedure Laterality Date  . CATARACT EXTRACTION W/PHACO  Left 05/29/2015   Procedure: CATARACT EXTRACTION PHACO AND INTRAOCULAR LENS PLACEMENT (IOC);  Surgeon: Leandrew Koyanagi, MD;  Location: [facility];  Service: Ophthalmology;  Laterality: Left;  . CATARACT EXTRACTION W/PHACO Right 01/15/2016   Procedure: CATARACT EXTRACTION PHACO AND INTRAOCULAR LENS PLACEMENT (IOC);  Surgeon: Leandrew Koyanagi, MD;  Location: [facility];  Service: Ophthalmology;  Laterality: Right;  SHUGARCAINE  . ESOPHAGOGASTRODUODENOSCOPY (EGD) WITH PROPOFOL N/A 03/16/2017   Procedure: ESOPHAGOGASTRODUODENOSCOPY (EGD) WITH PROPOFOL;  Surgeon: [name], MD;  Location: [facility];  Service: Endoscopy;  Laterality: N/A;  . ESOPHAGOGASTRODUODENOSCOPY (EGD) WITH PROPOFOL N/A 06/25/2017   Procedure: ESOPHAGOGASTRODUODENOSCOPY (EGD) WITH PROPOFOL;  Surgeon: Jonathon Bellows, MD;  [facility]: [facility];  Service: Endoscopy;  Laterality: N/A;  . 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 Family is in the area.  Social History   Social History  . Marital status: Widowed    Spouse name: N/A  . Number of children: N/A  . Years of education: N/A   Occupational History  . 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 Smoking status: Former Smoker    Types: Cigarettes    Quit date: 07/21/2000  . Smokeless tobacco: Never Used  . Alcohol use No  . Drug use: No  . Sexual activity: No   Other Topics Concern  . Not on file   Social History Narrative  Church activities, community service.   Lives alone.    FAMILY HISTORY: Family History  Problem Relation Age of Onset  . Heart attack Mother   . Cancer Maternal Uncle        renal cancer   . COPD Neg Hx        no breast, ovarian or colon    ALLERGIES:  is allergic to amlodipine.  MEDICATIONS:  Current Outpatient Prescriptions  Medication Sig Dispense Refill  . azithromycin (ZITHROMAX Z-PAK) 250 MG  tablet 1 -Z-pack, follow package directions 6 each 0  . HYDROcodone-acetaminophen (NORCO) 5-325 MG tablet Take 1 tablet by mouth every 6 (six) hours as needed for moderate pain. 60 tablet 0  . levothyroxine (SYNTHROID, LEVOTHROID) 88 MCG tablet TAKE ONE (1) TABLET EACH DAY 90 tablet 1  . ondansetron (ZOFRAN ODT) 4 MG disintegrating tablet Take 1 tablet (4 mg total) by mouth every 8 (eight) hours as needed for nausea or vomiting. 20 tablet 0  . sucralfate (CARAFATE) 1 g tablet Take 1 tablet (1 g total) by mouth 4 (four) times daily -  with meals and at bedtime. Dissolve in warm water, swish and swallow 90 tablet 3  . aspirin 81 MG tablet Take 81 mg by mouth daily.    . cholecalciferol (VITAMIN D) 1000 UNITS tablet Take 1,000 Units by mouth daily.    Marland Kitchen dexamethasone (DECADRON) 4 MG tablet Take 1 tablet (4 mg total) by mouth 2 (two) times daily. (Patient not taking: Reported on 07/01/2017) 30 tablet 0  . labetalol (NORMODYNE) 300 MG tablet Take 0.5 tablets (150 mg total) by mouth 3 (three) times daily. 1/2 tab (150 mg) Am and PM (Patient not taking: Reported on 07/01/2017) 270 tablet 1  . losartan (COZAAR) 100 MG tablet TAKE ONE (1) TABLET BY MOUTH EVERY DAY (Patient not taking: Reported on 07/01/2017) 90 tablet 1  . Multiple Vitamins-Minerals (PRESERVISION AREDS PO) Take by mouth daily.    . simvastatin (ZOCOR) 40 MG tablet TAKE ONE (1) TABLET AT BEDTIME (Patient not taking: Reported on 03/19/2017) 90 tablet 1   No current facility-administered medications for this visit.       Marland Kitchen  PHYSICAL EXAMINATION: ECOG PERFORMANCE STATUS: 0 - Asymptomatic  Vitals:   07/01/17 0941  BP: (!) 155/81  Pulse: 86  Resp: 18  Temp: 97.8 F (36.6 C)   Filed Weights   07/01/17 0941  Weight: 120 lb (54.4 kg)    GENERAL: Well-nourished well-developed; Alert, no distress and comfortable.   Accompanied byAnother daughter.  EYES: no pallor or icterus OROPHARYNX: no thrush or ulceration; good dentition  NECK:  supple, no masses felt LYMPH:  no palpable lymphadenopathy in the cervical, axillary or inguinal regions LUNGS: clear to auscultation and  No wheeze or crackles HEART/CVS: regular rate & rhythm and no murmurs; No lower extremity edema ABDOMEN: abdomen soft, non-tender and normal bowel sounds Musculoskeletal:no cyanosis of digits and no clubbing  PSYCH: alert & oriented x 3 with fluent speech NEURO: no focal motor/sensory deficits SKIN:  no rashes or significant lesions  LABORATORY DATA:  I have reviewed the data as listed Lab Results  Component Value Date   WBC 7.4 07/01/2017   HGB 11.9 (L) 07/01/2017   HCT 33.5 (L) 07/01/2017   MCV 88.1 07/01/2017   PLT 369 07/01/2017    Recent Labs  06/04/17 1320 06/11/17 1315 06/13/17 1347 06/18/17 1320 06/23/17 1306 07/01/17 1050  NA 129* 127* 130* 129* 131* 130*  K 4.2 4.2 3.8 3.5 3.4*  3.1*  CL 93* 91* 97* 94* 93* 92*  CO2 28 29 27 28 28 28   GLUCOSE 124* 110* 123* 93 176* 104*  BUN 37* 40* 29* 19 25* 18  CREATININE 1.00 1.06* 0.89 0.78 1.09* 0.96  CALCIUM 9.6 9.0 9.1 8.7* 9.2 9.1  GFRNONAA 50* 47* 57* >60 45* 52*  GFRAA 58* 54* >60 >60 52* >60  PROT 7.0 6.7 6.7  --   --   --   ALBUMIN 3.5 3.4* 3.2*  --   --   --   AST 24 21 24   --   --   --   ALT 21 18 16   --   --   --   ALKPHOS 110 105 95  --   --   --   BILITOT 0.4 0.6 0.6  --   --   --     RADIOGRAPHIC STUDIES: I have personally reviewed the radiological images as listed and agreed with the findings in the report. No results found.  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 PET- positive paraesophgeal uptake.Txn1- stage III. S/p  concurrent chemoradiation- carbo-taxol weekly [finished May 21st].   # Status post chemoradiation [May 21]- with worsening back pain; question worsening malignancy. Get a PET scan.   # back pain- ? Etiology- recommend continue the hydrocodone;  await PET scan.   # Weight loss/difficulty swallowing- today 130 [prior history of severe hyponatremia]- check labs today. Refer back to dietician. Discussed regarding feeding tube. Patient reluctant. Reviewed the EGD with the patient.   # follow up few days after the PET. Get labs today; lipase; amylase; labs/IVFs/ MD visit in 1 week. Discussed with Dr.Crystal.      Cammie Sickle, MD 07/01/2017 2:42 PM

## 2017-07-02 ENCOUNTER — Telehealth: Payer: Self-pay | Admitting: *Deleted

## 2017-07-02 ENCOUNTER — Other Ambulatory Visit: Payer: Self-pay | Admitting: Internal Medicine

## 2017-07-02 DIAGNOSIS — E876 Hypokalemia: Secondary | ICD-10-CM | POA: Insufficient documentation

## 2017-07-02 NOTE — Telephone Encounter (Signed)
States liquid  Potassium is $273 and does not want to pay that to find that she is unable to take it

## 2017-07-02 NOTE — Telephone Encounter (Signed)
Dr. Jacinto Brittney Jackson- pt does not want to start on lqd potassium. Per Hassan Rowan, RN, pt's daughter reports that pt has only been able to eat a few bites of a banana.  Would you want to consider prescribing a potassium tablet for pt to crush? Or just encourage po dietary intake?

## 2017-07-02 NOTE — Telephone Encounter (Signed)
Daughter called concerned that patient will not be able to take the potassium even in liquid form because of her difficulty swallowing even thew liquids. She is asking if patient should come in for IV K+ instead. Please advise

## 2017-07-02 NOTE — Telephone Encounter (Signed)
Per md - patient needs to try the oral lqd potassium first. Per md, pt does not have a critical potassium level to require IV potassium. Also, not able to accommodate due to acuity in infusion suite for IV potassium today.

## 2017-07-03 ENCOUNTER — Encounter: Payer: Self-pay | Admitting: Emergency Medicine

## 2017-07-03 ENCOUNTER — Emergency Department
Admission: EM | Admit: 2017-07-03 | Discharge: 2017-07-03 | Disposition: A | Discharge status: Home / Self Care | Payer: Medicare Other | Attending: Emergency Medicine | Admitting: Emergency Medicine

## 2017-07-03 ENCOUNTER — Emergency Department: Payer: Medicare Other

## 2017-07-03 DIAGNOSIS — E860 Dehydration: Secondary | ICD-10-CM

## 2017-07-03 DIAGNOSIS — Z79899 Other long term (current) drug therapy: Secondary | ICD-10-CM | POA: Diagnosis not present

## 2017-07-03 DIAGNOSIS — R5383 Other fatigue: Secondary | ICD-10-CM | POA: Diagnosis present

## 2017-07-03 DIAGNOSIS — Z7982 Long term (current) use of aspirin: Secondary | ICD-10-CM | POA: Diagnosis not present

## 2017-07-03 DIAGNOSIS — Z87891 Personal history of nicotine dependence: Secondary | ICD-10-CM | POA: Diagnosis not present

## 2017-07-03 DIAGNOSIS — E876 Hypokalemia: Secondary | ICD-10-CM | POA: Insufficient documentation

## 2017-07-03 DIAGNOSIS — I10 Essential (primary) hypertension: Secondary | ICD-10-CM | POA: Insufficient documentation

## 2017-07-03 DIAGNOSIS — R112 Nausea with vomiting, unspecified: Secondary | ICD-10-CM | POA: Diagnosis not present

## 2017-07-03 DIAGNOSIS — E039 Hypothyroidism, unspecified: Secondary | ICD-10-CM | POA: Diagnosis not present

## 2017-07-03 DIAGNOSIS — C155 Malignant neoplasm of lower third of esophagus: Secondary | ICD-10-CM | POA: Insufficient documentation

## 2017-07-03 DIAGNOSIS — R05 Cough: Secondary | ICD-10-CM | POA: Diagnosis not present

## 2017-07-03 DIAGNOSIS — R0602 Shortness of breath: Secondary | ICD-10-CM | POA: Diagnosis not present

## 2017-07-03 LAB — BASIC METABOLIC PANEL
ANION GAP: 9 (ref 5–15)
BUN: 16 mg/dL (ref 6–20)
CALCIUM: 9.1 mg/dL (ref 8.9–10.3)
CHLORIDE: 93 mmol/L — AB (ref 101–111)
CO2: 29 mmol/L (ref 22–32)
Creatinine, Ser: 0.9 mg/dL (ref 0.44–1.00)
GFR calc non Af Amer: 57 mL/min — ABNORMAL LOW (ref >60)
GLUCOSE: 120 mg/dL — AB (ref 65–99)
POTASSIUM: 3.1 mmol/L — AB (ref 3.5–5.1)
Sodium: 131 mmol/L — ABNORMAL LOW (ref 135–145)

## 2017-07-03 LAB — LIPASE, BLOOD: Lipase: 30 U/L (ref 11–51)

## 2017-07-03 LAB — HEPATIC FUNCTION PANEL
ALBUMIN: 3.5 g/dL (ref 3.5–5.0)
ALT: 24 U/L (ref 14–54)
AST: 37 U/L (ref 15–41)
Alkaline Phosphatase: 108 U/L (ref 38–126)
Bilirubin, Direct: <0.1 mg/dL — ABNORMAL LOW (ref 0.1–0.5)
Total Bilirubin: 0.6 mg/dL (ref 0.3–1.2)
Total Protein: 7.3 g/dL (ref 6.5–8.1)

## 2017-07-03 LAB — CBC WITH DIFFERENTIAL/PLATELET
BASOS ABS: 0.1 10*3/uL (ref 0–0.1)
Basophils Relative: 1 %
Eosinophils Absolute: 0 10*3/uL (ref 0–0.7)
Eosinophils Relative: 1 %
HEMATOCRIT: 35.1 % (ref 35.0–47.0)
Hemoglobin: 12.3 g/dL (ref 12.0–16.0)
LYMPHS ABS: 1.1 10*3/uL (ref 1.0–3.6)
LYMPHS PCT: 16 %
MCH: 30.8 pg (ref 26.0–34.0)
MCHC: 35 g/dL (ref 32.0–36.0)
MCV: 88.1 fL (ref 80.0–100.0)
MONO ABS: 1.2 10*3/uL — AB (ref 0.2–0.9)
Monocytes Relative: 16 %
NEUTROS ABS: 5 10*3/uL (ref 1.4–6.5)
Neutrophils Relative %: 68 %
Platelets: 358 10*3/uL (ref 150–440)
RBC: 3.99 MIL/uL (ref 3.80–5.20)
RDW: 16.6 % — ABNORMAL HIGH (ref 11.5–14.5)
WBC: 7.3 10*3/uL (ref 3.6–11.0)

## 2017-07-03 LAB — PROTIME-INR
INR: 0.95
Prothrombin Time: 12.7 seconds (ref 11.4–15.2)

## 2017-07-03 MED ORDER — POTASSIUM CHLORIDE 20 MEQ/15ML (10%) PO SOLN
40.0000 meq | Freq: Once | ORAL | Status: AC
  Administered 2017-07-03: 40 meq via ORAL
  Filled 2017-07-03: qty 30

## 2017-07-03 MED ORDER — ONDANSETRON 4 MG PO TBDP: 4.0000 mg | ORAL_TABLET | Freq: Three times a day (TID) | ORAL | 0 refills | Status: DC | PRN

## 2017-07-03 MED ORDER — ONDANSETRON HCL 40 MG/20ML IJ SOLN
16.0000 mg | Freq: Once | INTRAMUSCULAR | Status: AC
  Administered 2017-07-03: 16 mg via INTRAVENOUS
  Filled 2017-07-03: qty 8

## 2017-07-03 MED ORDER — SODIUM CHLORIDE 0.9 % IV BOLUS (SEPSIS)
1000.0000 mL | Freq: Once | INTRAVENOUS | Status: AC
  Administered 2017-07-03: 1000 mL via INTRAVENOUS

## 2017-07-03 NOTE — ED Provider Notes (Signed)
[name] Emergency Department Provider Note  ____________________________________________   First MD Initiated Contact with Patient 07/03/17 1606     (approximate)  I have reviewed the triage vital signs and the nursing notes.   HISTORY  Chief Complaint Fatigue and Emesis    HPI Brittney Jackson is a 83 y.o. female who comes to the emergency department with fatigue and emesis and is requesting IV fluids. She has a past medical history of esophageal cancer and is normally only to swallow liquids. She is status post chemotherapy and radiation with last doses over 1 month ago. She is here to have surgery. She normally receives IV fluids once a week secondary to dehydration and electrolyte disturbances but was unable to get into clinic today. She has chronic nausea and has vomited several times not alleviated by her home Zofran. Her vomiting is somewhat worsened when trying to take potassium supplementation.   Past Medical History:  Diagnosis Date  . Arthritis    left knee  . Cancer ([place])    esophageal cancer  . Function kidney decreased   . Hypertension   . Hypothyroidism   . Inappropriate ADH secretion ([facility])   . Low sodium levels   . Osteoporosis   . Renal disorder     Patient Active Problem List   Diagnosis Date Noted  . Hypokalemia 07/02/2017  . Dehydration 05/28/2017  . Malignant neoplasm of lower third of esophagus ([place]) 03/19/2017  . Dysphagia 03/06/2017  . Breast cancer screening 03/06/2017  . S/P left cataract extraction 09/01/2015  . Insomnia 08/30/2014  . Hyposmolality and/or hyponatremia 01/09/2014  . Routine general medical examination at a health care facility 01/24/2013  . Osteopenia 04/26/2012  . White coat hypertension 01/22/2012  . Hypothyroidism 01/22/2012  . Hyperlipidemia with target LDL less than 100 01/22/2012    Past Surgical History:  Procedure Laterality Date  . CATARACT EXTRACTION W/PHACO Left 05/29/2015   Procedure: CATARACT EXTRACTION PHACO AND INTRAOCULAR LENS PLACEMENT (IOC);  Surgeon: Leandrew Koyanagi, MD;  Location: [facility];  Service: Ophthalmology;  Laterality: Left;  . CATARACT EXTRACTION W/PHACO Right 01/15/2016   Procedure: CATARACT EXTRACTION PHACO AND INTRAOCULAR LENS PLACEMENT (IOC);  Surgeon: Leandrew Koyanagi, MD;  Location: [facility];  Service: Ophthalmology;  Laterality: Right;  SHUGARCAINE  . ESOPHAGOGASTRODUODENOSCOPY (EGD) WITH PROPOFOL N/A 03/16/2017   Procedure: ESOPHAGOGASTRODUODENOSCOPY (EGD) WITH PROPOFOL;  Surgeon: [name], MD;  Location: [facility] ENDOSCOPY;  Service: Endoscopy;  Laterality: N/A;  . ESOPHAGOGASTRODUODENOSCOPY (EGD) WITH PROPOFOL N/A 06/25/2017   Procedure: ESOPHAGOGASTRODUODENOSCOPY (EGD) WITH PROPOFOL;  Surgeon: Jonathon Bellows, MD;  Location[facility] END[facility]COPY;  Service: Endoscopy;  Laterality: N/A;  . TONSILLECTOMY      Prior to Admission medications   Medication Sig Start Date End Date Taking? Authorizing Provider  aspirin 81 MG tablet Take 81 mg by mouth daily.    [provider]  azithromycin (ZITHROMAX Z-PAK) 250 MG tablet 1 -Z-pack, follow package directions 06/29/17   Noreene Filbert, MD  cholecalciferol (VITAMIN D) 1000 UNITS tablet Take 1,000 Units by mouth daily.    [provider]  dexamethasone (DECADRON) 4 MG tablet Take 1 tablet (4 mg total) by mouth 2 (two) times daily. Patient not taking: Reported on 07/01/2017 05/28/17   Cammie Sickle, MD  HYDROcodone-acetaminophen (NORCO) 5-325 MG tablet Take 1 tablet by mouth every 6 (six) hours as needed for moderate pain. 06/11/17   Sindy Guadeloupe, MD  labetalol (NORMODYNE) 300 MG tablet Take 0.5 tablets (150 mg total)  by mouth 3 (three) times daily. 1/2 tab (150 mg) Am and PM Patient not taking: Reported on 07/01/2017 02/28/16   Crecencio Mc, MD  levothyroxine (SYNTHROID, LEVOTHROID) 88 MCG tablet TAKE ONE (1) TABLET EACH DAY 05/19/17   Crecencio Mc, MD    losartan (COZAAR) 100 MG tablet TAKE ONE (1) TABLET BY MOUTH EVERY DAY Patient not taking: Reported on 07/01/2017 12/23/16   Crecencio Mc, MD  Multiple Vitamins-Minerals (PRESERVISION AREDS PO) Take by mouth daily.    [provider]  ondansetron (ZOFRAN ODT) 4 MG disintegrating tablet Take 1 tablet (4 mg total) by mouth every 8 (eight) hours as needed for nausea or vomiting. 07/03/17   Darel Hong, MD  potassium chloride 20 MEQ/15ML (10%) SOLN Take 15 mLs (20 mEq total) by mouth daily. 07/01/17   Cammie Sickle, MD  simvastatin (ZOCOR) 40 MG tablet TAKE ONE (1) TABLET AT BEDTIME Patient not taking: Reported on 03/19/2017 09/14/16   Crecencio Mc, MD  sucralfate (CARAFATE) 1 g tablet Take 1 tablet (1 g total) by mouth 4 (four) times daily -  with meals and at bedtime. Dissolve in warm water, swish and swallow 04/06/17   Noreene Filbert, MD    Allergies Amlodipine  Family History  Problem Relation Age of Onset  . Heart attack Mother   . Cancer Maternal Uncle        renal cancer   . COPD Neg Hx        no breast, ovarian or colon    Social History Social History  Substance Use Topics  . Smoking status: Former Smoker    Types: Cigarettes    Quit date: 07/21/2000  . Smokeless tobacco: Never Used  . Alcohol use No    Review of Systems Constitutional: No fever/chills Eyes: No visual changes. ENT: No sore throat. Cardiovascular: Denies chest pain. Respiratory: Denies shortness of breath. Gastrointestinal: No abdominal pain.  Positive nausea, positive vomiting.  No diarrhea.  No constipation. Genitourinary: Negative for dysuria. Musculoskeletal: Negative for back pain. Skin: Negative for rash. Neurological: Negative for headaches, focal weakness or numbness.   ____________________________________________   PHYSICAL EXAM:  VITAL SIGNS: ED Triage Vitals  Enc Vitals Group     BP 07/03/17 1517 (!) 145/85     Pulse Rate 07/03/17 1517 89     Resp 07/03/17 1517  18     Temp 07/03/17 1517 97.8 F (36.6 C)     Temp Source 07/03/17 1517 Oral     SpO2 07/03/17 1517 95 %     Weight 07/03/17 1517 120 lb (54.4 kg)     Height 07/03/17 1517 5' (1.524 m)     Head Circumference --      Peak Flow --      Pain Score 07/03/17 1516 0     Pain Loc --      Pain Edu? --      Excl. in [place]? --     Constitutional: Alert and oriented 4 pleasant cooperative speaks in full clear sentences no diaphoresis Eyes: PERRL EOMI. Head: Atraumatic. Nose: No congestion/rhinnorhea. Mouth/Throat: No trismus Neck: No stridor.   Cardiovascular: Normal rate, regular rhythm. Grossly normal heart sounds.  Good peripheral circulation. Respiratory: Normal respiratory effort.  No retractions. Lungs CTAB and moving good air Gastrointestinal: Soft nondistended nontender no rebound or guarding no peritonitis Musculoskeletal: No lower extremity edema   Neurologic:  Normal speech and language. No gross focal neurologic deficits are appreciated. Skin:  Skin is warm,  dry and intact. No rash noted. Psychiatric: Mood and affect are normal. Speech and behavior are normal.    ____________________________________________   DIFFERENTIAL includes but not limited to  Dehydration, hypokalemia, metabolic derangement, obstruction ____________________________________________   LABS (all labs ordered are listed, but only abnormal results are displayed)  Labs Reviewed  BASIC METABOLIC PANEL - Abnormal; Notable for the following:       Result Value   Sodium 131 (*)    Potassium 3.1 (*)    Chloride 93 (*)    Glucose, Bld 120 (*)    GFR calc non Af Amer 57 (*)    All other components within normal limits  HEPATIC FUNCTION PANEL - Abnormal; Notable for the following:    Bilirubin, Direct <0.1 (*)    All other components within normal limits  CBC WITH DIFFERENTIAL/PLATELET - Abnormal; Notable for the following:    RDW 16.6 (*)    Monocytes Absolute 1.2 (*)    All other components within  normal limits  PROTIME-INR  LIPASE, BLOOD    Slight hypokalemia slight hyponatremia although clinically insignificant __________________________________________  EKG   ____________________________________________  RADIOLOGY  Chest x-ray with no acute disease ____________________________________________   PROCEDURES  Procedure(s) performed: no  Procedures  Critical Care performed: no  Observation: no ____________________________________________   INITIAL IMPRESSION / ASSESSMENT AND PLAN / ED COURSE  Pertinent labs & imaging results that were available during my care of the patient were reviewed by me and considered in my medical decision making (see chart for details).  The patient arrives well-appearing although nauseated. She is requesting IV fluids which I think is reasonable. I will give her a large dose of Zofran given her chronic nausea secondary to her malignancy. The patient and her daughter are somewhat concerned about her hypokalemia although they are worried because potassium always makes her throw up. They would like to trial liquid potassium here today.  Unfortunately the patient was not able to tolerate a liquid potassium. After IV hydration she feels better when like to go home. This point she is medically stable for outpatient management.      ____________________________________________   FINAL CLINICAL IMPRESSION(S) / ED DIAGNOSES  Final diagnoses:  Dehydration  Hypokalemia      NEW MEDICATIONS STARTED DURING THIS VISIT:  Discharge Medication List as of 07/03/2017  6:34 PM       Note:  This document was prepared using Dragon voice recognition software and may include unintentional dictation errors.     Darel Hong, MD 07/04/17 [phone]

## 2017-07-03 NOTE — ED Notes (Signed)
Dr. Mable Paris notified that pt was vomiting, no orders received at this time

## 2017-07-03 NOTE — Discharge Instructions (Signed)
Please follow-up with your primary care physician and your oncologist as scheduled. Return to the emergency department for any concerns.  It was a pleasure to take care of you today, and thank you for coming to our emergency department.  If you have any questions or concerns before leaving please ask the nurse to grab me and I'm more than happy to go through your aftercare instructions again.  If you were prescribed any opioid pain medication today such as Norco, Vicodin, Percocet, morphine, hydrocodone, or oxycodone please make sure you do not drive when you are taking this medication as it can alter your ability to drive safely.  If you have any concerns once you are home that you are not improving or are in fact getting worse before you can make it to your follow-up appointment, please do not hesitate to call [phone] and come back for further evaluation.  Darel Hong MD  Results for orders placed or performed during the hospital encounter of 70/35/00  Basic metabolic panel  Result Value Ref Range   Sodium 131 (L) 135 - 145 mmol/L   Potassium 3.1 (L) 3.5 - 5.1 mmol/L   Chloride 93 (L) 101 - 111 mmol/L   CO2 29 22 - 32 mmol/L   Glucose, Bld 120 (H) 65 - 99 mg/dL   BUN 16 6 - 20 mg/dL   Creatinine, Ser 0.90 0.44 - 1.00 mg/dL   Calcium 9.1 8.9 - 10.3 mg/dL   GFR calc non Af Amer 57 (L) >60 mL/min   GFR calc Af Amer >60 >60 mL/min   Anion gap 9 5 - 15  Hepatic function panel  Result Value Ref Range   Total Protein 7.3 6.5 - 8.1 g/dL   Albumin 3.5 3.5 - 5.0 g/dL   AST 37 15 - 41 U/L   ALT 24 14 - 54 U/L   Alkaline Phosphatase 108 38 - 126 U/L   Total Bilirubin 0.6 0.3 - 1.2 mg/dL   Bilirubin, Direct <0.1 (L) 0.1 - 0.5 mg/dL   Indirect Bilirubin NOT CALCULATED 0.3 - 0.9 mg/dL  Protime-INR  Result Value Ref Range   Prothrombin Time 12.7 11.4 - 15.2 seconds   INR 0.95   CBC with Differential  Result Value Ref Range   WBC 7.3 3.6 - 11.0 K/uL   RBC 3.99 3.80 - 5.20 MIL/uL   Hemoglobin 12.3 12.0 - 16.0 g/dL   HCT 35.1 35.0 - 47.0 %   MCV 88.1 80.0 - 100.0 fL   MCH 30.8 26.0 - 34.0 pg   MCHC 35.0 32.0 - 36.0 g/dL   RDW 16.6 (H) 11.5 - 14.5 %   Platelets 358 150 - 440 K/uL   Neutrophils Relative % 68 %   Neutro Abs 5.0 1.4 - 6.5 K/uL   Lymphocytes Relative 16 %   Lymphs Abs 1.1 1.0 - 3.6 K/uL   Monocytes Relative 16 %   Monocytes Absolute 1.2 (H) 0.2 - 0.9 K/uL   Eosinophils Relative 1 %   Eosinophils Absolute 0.0 0 - 0.7 K/uL   Basophils Relative 1 %   Basophils Absolute 0.1 0 - 0.1 K/uL  Lipase, blood  Result Value Ref Range   Lipase 30 11 - 51 U/L   Dg Chest Port 1 View  Result Date: 07/03/2017 CLINICAL DATA:  Cough and shortness of breath.  Esophageal carcinoma EXAM: PORTABLE CHEST 1 VIEW COMPARISON:  PET-CT March 24, 2017; chest radiograph December 31, 2013 FINDINGS: There is no edema or consolidation. Heart size  is upper normal with pulmonary vascularity within normal limits. No adenopathy evident. There is aortic atherosclerosis. There is calcification in each carotid artery. There is no obvious esophageal thickening by radiography. No bone lesions. IMPRESSION: No edema or consolidation. No evident adenopathy. Aortic atherosclerosis. There is carotid artery calcification bilaterally. Aortic Atherosclerosis (ICD10-I70.0). Electronically Signed   By: Lowella Grip III M.D.   On: 07/03/2017 16:21

## 2017-07-03 NOTE — ED Triage Notes (Signed)
Patient presents to the ED with increasing fatigue and weakness over the past week and vomiting that is not improved with Zofran today.  Patient states she has esophageal cancer and is only able to eat and drink very small amounts.  Patient has to frequently come to the hospital to receive IV fluids.  Patient last received fluids 9 days ago.

## 2017-07-03 NOTE — ED Notes (Signed)
Per primary RN, dc pt without UA

## 2017-07-05 ENCOUNTER — Emergency Department: Payer: Medicare Other

## 2017-07-05 ENCOUNTER — Other Ambulatory Visit: Payer: Self-pay | Admitting: *Deleted

## 2017-07-05 ENCOUNTER — Other Ambulatory Visit: Payer: Self-pay

## 2017-07-05 ENCOUNTER — Inpatient Hospital Stay: Payer: Medicare Other

## 2017-07-05 ENCOUNTER — Encounter: Payer: Self-pay | Admitting: Emergency Medicine

## 2017-07-05 ENCOUNTER — Inpatient Hospital Stay (HOSPITAL_BASED_OUTPATIENT_CLINIC_OR_DEPARTMENT_OTHER): Payer: Medicare Other | Admitting: Oncology

## 2017-07-05 ENCOUNTER — Telehealth: Payer: Self-pay | Admitting: *Deleted

## 2017-07-05 ENCOUNTER — Observation Stay
Admission: EM | Admit: 2017-07-05 | Discharge: 2017-07-06 | Disposition: A | Discharge status: Home / Self Care | Payer: Medicare Other | Attending: Internal Medicine | Admitting: Internal Medicine

## 2017-07-05 DIAGNOSIS — K5732 Diverticulitis of large intestine without perforation or abscess without bleeding: Secondary | ICD-10-CM | POA: Diagnosis not present

## 2017-07-05 DIAGNOSIS — E039 Hypothyroidism, unspecified: Secondary | ICD-10-CM | POA: Diagnosis not present

## 2017-07-05 DIAGNOSIS — N2889 Other specified disorders of kidney and ureter: Secondary | ICD-10-CM | POA: Diagnosis not present

## 2017-07-05 DIAGNOSIS — E871 Hypo-osmolality and hyponatremia: Secondary | ICD-10-CM | POA: Insufficient documentation

## 2017-07-05 DIAGNOSIS — I700 Atherosclerosis of aorta: Secondary | ICD-10-CM | POA: Insufficient documentation

## 2017-07-05 DIAGNOSIS — Z79899 Other long term (current) drug therapy: Secondary | ICD-10-CM

## 2017-07-05 DIAGNOSIS — K529 Noninfective gastroenteritis and colitis, unspecified: Principal | ICD-10-CM | POA: Insufficient documentation

## 2017-07-05 DIAGNOSIS — G4700 Insomnia, unspecified: Secondary | ICD-10-CM | POA: Insufficient documentation

## 2017-07-05 DIAGNOSIS — M810 Age-related osteoporosis without current pathological fracture: Secondary | ICD-10-CM | POA: Insufficient documentation

## 2017-07-05 DIAGNOSIS — Z8051 Family history of malignant neoplasm of kidney: Secondary | ICD-10-CM

## 2017-07-05 DIAGNOSIS — R197 Diarrhea, unspecified: Secondary | ICD-10-CM

## 2017-07-05 DIAGNOSIS — B9689 Other specified bacterial agents as the cause of diseases classified elsewhere: Secondary | ICD-10-CM | POA: Diagnosis not present

## 2017-07-05 DIAGNOSIS — M818 Other osteoporosis without current pathological fracture: Secondary | ICD-10-CM

## 2017-07-05 DIAGNOSIS — M129 Arthropathy, unspecified: Secondary | ICD-10-CM

## 2017-07-05 DIAGNOSIS — Z9221 Personal history of antineoplastic chemotherapy: Secondary | ICD-10-CM | POA: Insufficient documentation

## 2017-07-05 DIAGNOSIS — R634 Abnormal weight loss: Secondary | ICD-10-CM | POA: Diagnosis not present

## 2017-07-05 DIAGNOSIS — R1310 Dysphagia, unspecified: Secondary | ICD-10-CM | POA: Insufficient documentation

## 2017-07-05 DIAGNOSIS — R5383 Other fatigue: Secondary | ICD-10-CM | POA: Diagnosis not present

## 2017-07-05 DIAGNOSIS — I10 Essential (primary) hypertension: Secondary | ICD-10-CM | POA: Diagnosis not present

## 2017-07-05 DIAGNOSIS — Z7982 Long term (current) use of aspirin: Secondary | ICD-10-CM | POA: Diagnosis not present

## 2017-07-05 DIAGNOSIS — N390 Urinary tract infection, site not specified: Secondary | ICD-10-CM | POA: Diagnosis not present

## 2017-07-05 DIAGNOSIS — Z6824 Body mass index (BMI) 24.0-24.9, adult: Secondary | ICD-10-CM | POA: Diagnosis not present

## 2017-07-05 DIAGNOSIS — Z923 Personal history of irradiation: Secondary | ICD-10-CM | POA: Diagnosis not present

## 2017-07-05 DIAGNOSIS — C155 Malignant neoplasm of lower third of esophagus: Secondary | ICD-10-CM

## 2017-07-05 DIAGNOSIS — E222 Syndrome of inappropriate secretion of antidiuretic hormone: Secondary | ICD-10-CM

## 2017-07-05 DIAGNOSIS — E440 Moderate protein-calorie malnutrition: Secondary | ICD-10-CM | POA: Insufficient documentation

## 2017-07-05 DIAGNOSIS — Z87891 Personal history of nicotine dependence: Secondary | ICD-10-CM

## 2017-07-05 DIAGNOSIS — N3000 Acute cystitis without hematuria: Secondary | ICD-10-CM | POA: Insufficient documentation

## 2017-07-05 DIAGNOSIS — M1712 Unilateral primary osteoarthritis, left knee: Secondary | ICD-10-CM | POA: Insufficient documentation

## 2017-07-05 DIAGNOSIS — M549 Dorsalgia, unspecified: Secondary | ICD-10-CM | POA: Diagnosis not present

## 2017-07-05 DIAGNOSIS — R531 Weakness: Secondary | ICD-10-CM | POA: Diagnosis not present

## 2017-07-05 DIAGNOSIS — E876 Hypokalemia: Secondary | ICD-10-CM | POA: Diagnosis not present

## 2017-07-05 DIAGNOSIS — R918 Other nonspecific abnormal finding of lung field: Secondary | ICD-10-CM | POA: Diagnosis not present

## 2017-07-05 DIAGNOSIS — E785 Hyperlipidemia, unspecified: Secondary | ICD-10-CM | POA: Diagnosis not present

## 2017-07-05 DIAGNOSIS — N281 Cyst of kidney, acquired: Secondary | ICD-10-CM | POA: Insufficient documentation

## 2017-07-05 DIAGNOSIS — E860 Dehydration: Secondary | ICD-10-CM | POA: Diagnosis not present

## 2017-07-05 DIAGNOSIS — M25471 Effusion, right ankle: Secondary | ICD-10-CM | POA: Diagnosis not present

## 2017-07-05 DIAGNOSIS — Z8501 Personal history of malignant neoplasm of esophagus: Secondary | ICD-10-CM | POA: Diagnosis not present

## 2017-07-05 DIAGNOSIS — K5792 Diverticulitis of intestine, part unspecified, without perforation or abscess without bleeding: Secondary | ICD-10-CM | POA: Diagnosis not present

## 2017-07-05 DIAGNOSIS — R1033 Periumbilical pain: Secondary | ICD-10-CM

## 2017-07-05 DIAGNOSIS — R911 Solitary pulmonary nodule: Secondary | ICD-10-CM | POA: Diagnosis not present

## 2017-07-05 DIAGNOSIS — R109 Unspecified abdominal pain: Secondary | ICD-10-CM | POA: Insufficient documentation

## 2017-07-05 DIAGNOSIS — K5730 Diverticulosis of large intestine without perforation or abscess without bleeding: Secondary | ICD-10-CM | POA: Diagnosis not present

## 2017-07-05 DIAGNOSIS — M25472 Effusion, left ankle: Secondary | ICD-10-CM | POA: Diagnosis not present

## 2017-07-05 LAB — BASIC METABOLIC PANEL
ANION GAP: 7 (ref 5–15)
BUN: 24 mg/dL — ABNORMAL HIGH (ref 6–20)
CO2: 27 mmol/L (ref 22–32)
Calcium: 9.3 mg/dL (ref 8.9–10.3)
Chloride: 95 mmol/L — ABNORMAL LOW (ref 101–111)
Creatinine, Ser: 1 mg/dL (ref 0.44–1.00)
GFR calc Af Amer: 58 mL/min — ABNORMAL LOW (ref >60)
GFR calc non Af Amer: 50 mL/min — ABNORMAL LOW (ref >60)
GLUCOSE: 130 mg/dL — AB (ref 65–99)
POTASSIUM: 3.5 mmol/L (ref 3.5–5.1)
Sodium: 129 mmol/L — ABNORMAL LOW (ref 135–145)

## 2017-07-05 LAB — HEPATIC FUNCTION PANEL
ALT: 20 U/L (ref 14–54)
AST: 25 U/L (ref 15–41)
Albumin: 3.3 g/dL — ABNORMAL LOW (ref 3.5–5.0)
Alkaline Phosphatase: 110 U/L (ref 38–126)
BILIRUBIN DIRECT: 0.1 mg/dL (ref 0.1–0.5)
BILIRUBIN INDIRECT: 0.5 mg/dL (ref 0.3–0.9)
TOTAL PROTEIN: 7.1 g/dL (ref 6.5–8.1)
Total Bilirubin: 0.6 mg/dL (ref 0.3–1.2)

## 2017-07-05 LAB — URINALYSIS, COMPLETE (UACMP) WITH MICROSCOPIC
BILIRUBIN URINE: NEGATIVE
GLUCOSE, UA: NEGATIVE mg/dL
HGB URINE DIPSTICK: NEGATIVE
KETONES UR: 20 mg/dL — AB
NITRITE: POSITIVE — AB
PROTEIN: 30 mg/dL — AB
Specific Gravity, Urine: 1.027 (ref 1.005–1.030)
pH: 5 (ref 5.0–8.0)

## 2017-07-05 LAB — MAGNESIUM: Magnesium: 1.8 mg/dL (ref 1.7–2.4)

## 2017-07-05 LAB — LIPASE, BLOOD: LIPASE: 22 U/L (ref 11–51)

## 2017-07-05 MED ORDER — ONDANSETRON HCL 4 MG/2ML IJ SOLN
4.0000 mg | Freq: Once | INTRAMUSCULAR | Status: AC
  Administered 2017-07-05: 4 mg via INTRAVENOUS
  Filled 2017-07-05: qty 2

## 2017-07-05 MED ORDER — SODIUM CHLORIDE 0.9 % IV BOLUS (SEPSIS): 1000.0000 mL | Freq: Once | INTRAVENOUS | Status: DC

## 2017-07-05 MED ORDER — SUCRALFATE 1 G PO TABS
1.0000 g | ORAL_TABLET | Freq: Three times a day (TID) | ORAL | Status: DC
  Administered 2017-07-06 (×2): 1 g via ORAL
  Filled 2017-07-05 (×2): qty 1

## 2017-07-05 MED ORDER — HYDROCODONE-ACETAMINOPHEN 5-325 MG PO TABS
1.0000 | ORAL_TABLET | ORAL | Status: DC | PRN
  Administered 2017-07-06 (×2): 1 via ORAL
  Filled 2017-07-05 (×2): qty 1

## 2017-07-05 MED ORDER — VITAMIN D 1000 UNITS PO TABS
1000.0000 [IU] | ORAL_TABLET | Freq: Every day | ORAL | Status: DC
  Administered 2017-07-06: 09:00:00 1000 [IU] via ORAL
  Filled 2017-07-05: qty 1

## 2017-07-05 MED ORDER — HYDROCODONE-ACETAMINOPHEN 5-325 MG PO TABS
1.0000 | ORAL_TABLET | Freq: Four times a day (QID) | ORAL | Status: DC | PRN
  Administered 2017-07-05: 1 via ORAL
  Filled 2017-07-05: qty 1

## 2017-07-05 MED ORDER — ONDANSETRON 4 MG PO TBDP
4.0000 mg | ORAL_TABLET | Freq: Three times a day (TID) | ORAL | Status: DC | PRN
  Filled 2017-07-05: qty 1

## 2017-07-05 MED ORDER — OCUVITE-LUTEIN PO CAPS
1.0000 | ORAL_CAPSULE | Freq: Every day | ORAL | Status: DC
  Administered 2017-07-06: 1 via ORAL
  Filled 2017-07-05 (×2): qty 1

## 2017-07-05 MED ORDER — CIPROFLOXACIN IN D5W 400 MG/200ML IV SOLN
400.0000 mg | Freq: Once | INTRAVENOUS | Status: AC
  Administered 2017-07-05: 400 mg via INTRAVENOUS
  Filled 2017-07-05: qty 200

## 2017-07-05 MED ORDER — ALBUTEROL SULFATE (2.5 MG/3ML) 0.083% IN NEBU
2.5000 mg | INHALATION_SOLUTION | Freq: Four times a day (QID) | RESPIRATORY_TRACT | Status: DC | PRN
Start: 2017-07-05 — End: 2017-07-06

## 2017-07-05 MED ORDER — MAGNESIUM CITRATE PO SOLN
1.0000 | Freq: Once | ORAL | Status: DC | PRN
  Filled 2017-07-05: qty 296

## 2017-07-05 MED ORDER — ASPIRIN 81 MG PO CHEW
81.0000 mg | CHEWABLE_TABLET | Freq: Every day | ORAL | Status: DC
  Administered 2017-07-06: 09:00:00 81 mg via ORAL
  Filled 2017-07-05: qty 1

## 2017-07-05 MED ORDER — BISACODYL 5 MG PO TBEC: 5.0000 mg | DELAYED_RELEASE_TABLET | Freq: Every day | ORAL | Status: DC | PRN

## 2017-07-05 MED ORDER — IPRATROPIUM BROMIDE 0.02 % IN SOLN: 0.5000 mg | Freq: Four times a day (QID) | RESPIRATORY_TRACT | Status: DC | PRN

## 2017-07-05 MED ORDER — SENNOSIDES-DOCUSATE SODIUM 8.6-50 MG PO TABS: 1.0000 | ORAL_TABLET | Freq: Every evening | ORAL | Status: DC | PRN

## 2017-07-05 MED ORDER — ONDANSETRON HCL 4 MG/2ML IJ SOLN: 4.0000 mg | Freq: Four times a day (QID) | INTRAMUSCULAR | Status: DC | PRN

## 2017-07-05 MED ORDER — HYDROCODONE-ACETAMINOPHEN 5-325 MG PO TABS
ORAL_TABLET | ORAL | Status: AC
  Administered 2017-07-05: 1 via ORAL
  Filled 2017-07-05: qty 1

## 2017-07-05 MED ORDER — LEVOTHYROXINE SODIUM 88 MCG PO TABS
88.0000 ug | ORAL_TABLET | Freq: Every day | ORAL | Status: DC
  Administered 2017-07-06: 88 ug via ORAL
  Filled 2017-07-05: qty 1

## 2017-07-05 MED ORDER — OXYCODONE HCL 5 MG PO TABS: 5.0000 mg | ORAL_TABLET | ORAL | Status: DC | PRN

## 2017-07-05 MED ORDER — METRONIDAZOLE IN NACL 5-0.79 MG/ML-% IV SOLN
500.0000 mg | Freq: Once | INTRAVENOUS | Status: AC
  Administered 2017-07-05: 500 mg via INTRAVENOUS
  Filled 2017-07-05: qty 100

## 2017-07-05 MED ORDER — ONDANSETRON HCL 4 MG PO TABS: 4.0000 mg | ORAL_TABLET | Freq: Four times a day (QID) | ORAL | Status: DC | PRN

## 2017-07-05 MED ORDER — LOSARTAN POTASSIUM 50 MG PO TABS
100.0000 mg | ORAL_TABLET | Freq: Every day | ORAL | Status: DC
  Administered 2017-07-06: 100 mg via ORAL
  Filled 2017-07-05: qty 2

## 2017-07-05 MED ORDER — IOPAMIDOL (ISOVUE-300) INJECTION 61%
100.0000 mL | Freq: Once | INTRAVENOUS | Status: AC | PRN
  Administered 2017-07-05: 100 mL via INTRAVENOUS

## 2017-07-05 MED ORDER — ACETAMINOPHEN 650 MG RE SUPP: 650.0000 mg | Freq: Four times a day (QID) | RECTAL | Status: DC | PRN

## 2017-07-05 MED ORDER — FAMOTIDINE 20 MG PO TABS
10.0000 mg | ORAL_TABLET | Freq: Two times a day (BID) | ORAL | Status: DC
  Administered 2017-07-06: 10 mg via ORAL
  Filled 2017-07-05: qty 1

## 2017-07-05 MED ORDER — LABETALOL HCL 100 MG PO TABS
150.0000 mg | ORAL_TABLET | Freq: Three times a day (TID) | ORAL | Status: DC
  Administered 2017-07-06: 150 mg via ORAL
  Filled 2017-07-05 (×4): qty 1.5

## 2017-07-05 MED ORDER — CIPROFLOXACIN IN D5W 400 MG/200ML IV SOLN
400.0000 mg | INTRAVENOUS | Status: DC
  Filled 2017-07-05: qty 200

## 2017-07-05 MED ORDER — ACETAMINOPHEN 325 MG PO TABS: 650.0000 mg | ORAL_TABLET | Freq: Four times a day (QID) | ORAL | Status: DC | PRN

## 2017-07-05 MED ORDER — DEXTROSE 5 % IV SOLN
1.0000 g | Freq: Once | INTRAVENOUS | Status: AC
  Administered 2017-07-05: 1 g via INTRAVENOUS
  Filled 2017-07-05: qty 10

## 2017-07-05 MED ORDER — SODIUM CHLORIDE 0.9 % IV SOLN
INTRAVENOUS | Status: DC
  Administered 2017-07-05: via INTRAVENOUS

## 2017-07-05 MED ORDER — HYDROCODONE-ACETAMINOPHEN 5-325 MG PO TABS
1.0000 | ORAL_TABLET | Freq: Once | ORAL | Status: AC
  Administered 2017-07-05: 1 via ORAL

## 2017-07-05 MED ORDER — METRONIDAZOLE IN NACL 5-0.79 MG/ML-% IV SOLN
500.0000 mg | Freq: Three times a day (TID) | INTRAVENOUS | Status: DC
  Administered 2017-07-06: 500 mg via INTRAVENOUS
  Filled 2017-07-05 (×3): qty 100

## 2017-07-05 MED ORDER — IOPAMIDOL (ISOVUE-370) INJECTION 76%: 100.0000 mL | Freq: Once | INTRAVENOUS | Status: DC | PRN

## 2017-07-05 MED ORDER — SODIUM CHLORIDE 0.9 % IV BOLUS (SEPSIS)
1000.0000 mL | Freq: Once | INTRAVENOUS | Status: AC
  Administered 2017-07-05: 1000 mL via INTRAVENOUS

## 2017-07-05 NOTE — ED Notes (Signed)
Pt and daughter refuse to be transferred until pharm reconciles med in person . Pharm called at this time, will be down shortly

## 2017-07-05 NOTE — ED Notes (Signed)
Pt states she takes at home hydrocodone q3hrs, and states wanting one at this time. MD Hueglemyer notfied, will change order to q3hrs

## 2017-07-05 NOTE — Progress Notes (Addendum)
Symptom Management Consult note [facility]  [phone] [phone]  Patient Care Team: Crecencio Mc, MD as PCP - General (Internal Medicine) Clent Jacks, RN as Registered Nurse   Name of the patient: Brittney Jackson  [id number]  1930/07/31   Date of visit: 07/06/17  Chief complaint/ Reason for visit- Weak/Fatigue/abdominal pain  Heme/Onc history: Diagnosed adenocarcinoma the Lower esophagus currently s/p  chemoradiation [finished RT on may 21st].  Interval history- Patient presents to clinic for weakness/fatigue and abdominal pain. She states she feels "dehydrated". She was seen in the emergency department on 07/03/17 and was given IV fluids and nausea medication and sent home. Today she continues to complain of nausea but denies vomiting. She is unable to eat or drink anything due to the nausea. She has 10/10 abdominal pain that began two days ago. It is a constant dull pain. She has taken one oxycodone with some relief. Pain is better when she sits/stands still. She has been unable to sleep at night due to the pain. She has had some diarrhea today and yesterday. She denies blood in her stool. She denies any neurologic complaints. She has no recent fevers or illnesses. She denies any easy bleeding or bruising. She has no chest pain. She has no urinary complaints. Patient offers no further specific complaints today.   ECOG FS:1 - Symptomatic but completely ambulatory  Review of systems- Review of Systems  Constitutional: Positive for malaise/fatigue.  HENT: Negative.   Eyes: Negative.   Respiratory: Negative.   Cardiovascular: Negative.   Gastrointestinal: Positive for abdominal pain, diarrhea, nausea and vomiting.  Genitourinary: Negative.   Musculoskeletal: Negative.   Skin: Negative.   Neurological: Negative.   Endo/Heme/Allergies: Negative.   Psychiatric/Behavioral: Negative.      Current treatment- Carbo/Taxol  04/01/17-05/07/17. She finished radiation therapy on 05/17/17. Has been receiving fluids weekly for the past few weeks due to hyponatremia and hypokalemia.   Allergies  Allergen Reactions  . Amlodipine Swelling     Past Medical History:  Diagnosis Date  . Arthritis    left knee  . Cancer ([place])    esophageal cancer  . Function kidney decreased   . Hypertension   . Hypothyroidism   . Inappropriate ADH secretion ([facility])   . Low sodium levels   . Osteoporosis   . Renal disorder      Past Surgical History:  Procedure Laterality Date  . CATARACT EXTRACTION W/PHACO Left 05/29/2015   Procedure: CATARACT EXTRACTION PHACO AND INTRAOCULAR LENS PLACEMENT (IOC);  Surgeon: Leandrew Koyanagi, MD;  Location: [place];  Service: Ophthalmology;  Laterality: Left;  . CATARACT EXTRACTION W/PHACO Right 01/15/2016   Procedure: CATARACT EXTRACTION PHACO AND INTRAOCULAR LENS PLACEMENT (IOC);  Surgeon: Leandrew Koyanagi, MD;  Location: [place];  Service: Ophthalmology;  Laterality: Right;  SHUGARCAINE  . ESOPHAGOGASTRODUODENOSCOPY (EGD) WITH PROPOFOL N/A 03/16/2017   Procedure: ESOPHAGOGASTRODUODENOSCOPY (EGD) WITH PROPOFOL;  Surgeon: [name], MD;  Location: [facility] ENDOSCOPY;  Service: Endoscopy;  Laterality: N/A;  . ESOPHAGOGASTRODUODENOSCOPY (EGD) WITH PROPOFOL N/A 06/25/2017   Procedure: ESOPHAGOGASTRODUODENOSCOPY (EGD) WITH PROPOFOL;  Surgeon: Jonathon Bellows, MD;  Location: [facility] ENDOSCOPY;  Service: Endoscopy;  Laterality: N/A;  . TONSILLECTOMY      Social History   Social History  . Marital status: Widowed    Spouse name: N/A  . Number of children: N/A  . Years of education: N/A   Occupational History  . 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 Smoking status: Former Smoker    Types: Cigarettes    Quit date: 07/21/2000  . Smokeless tobacco: Never Used  . Alcohol use No  . Drug use: No  . Sexual activity: No   Other Topics  Concern  . Not on file   Social History Narrative   Church activities, community service.   Lives alone.    Family History  Problem Relation Age of Onset  . Heart attack Mother   . Cancer Maternal Uncle        renal cancer   . COPD Neg Hx        no breast, ovarian or colon    No current facility-administered medications for this visit.  No current outpatient prescriptions on file.  Facility-Administered Medications Ordered in Other Visits:  .  0.9 %  sodium chloride infusion, , Intravenous, Continuous, Hugelmeyer, Alexis, DO, Last Rate: 75 mL/hr at 07/05/17 2346 .  acetaminophen (TYLENOL) tablet 650 mg, 650 mg, Oral, Q6H PRN **OR** acetaminophen (TYLENOL) suppository 650 mg, 650 mg, Rectal, Q6H PRN, Hugelmeyer, Alexis, DO .  albuterol (PROVENTIL) (2.5 MG/3ML) 0.083% nebulizer solution 2.5 mg, 2.5 mg, Nebulization, Q6H PRN, Hugelmeyer, Alexis, DO .  aspirin chewable tablet 81 mg, 81 mg, Oral, Daily, Hugelmeyer, Alexis, DO .  bisacodyl (DULCOLAX) EC tablet 5 mg, 5 mg, Oral, Daily PRN, Hugelmeyer, Alexis, DO .  cholecalciferol (VITAMIN D) tablet 1,000 Units, 1,000 Units, Oral, Daily, Hugelmeyer, Alexis, DO .  ciprofloxacin (CIPRO) IVPB 400 mg, 400 mg, Intravenous, Q24H, Hugelmeyer, Alexis, DO .  enoxaparin (LOVENOX) injection 40 mg, 40 mg, Subcutaneous, Q24H, Hugelmeyer, Alexis, DO .  famotidine (PEPCID) tablet 10 mg, 10 mg, Oral, BID, Hugelmeyer, Alexis, DO .  HYDROcodone-acetaminophen (NORCO/VICODIN) 5-325 MG per tablet 1 tablet, 1 tablet, Oral, Q4H PRN, Hugelmeyer, Alexis, DO, 1 tablet at 07/06/17 0239 .  ipratropium (ATROVENT) nebulizer solution 0.5 mg, 0.5 mg, Nebulization, Q6H PRN, Hugelmeyer, Alexis, DO .  labetalol (NORMODYNE) tablet 150 mg, 150 mg, Oral, TID, Hugelmeyer, Alexis, DO .  levothyroxine (SYNTHROID, LEVOTHROID) tablet 88 mcg, 88 mcg, Oral, QAC breakfast, Hugelmeyer, Alexis, DO .  losartan (COZAAR) tablet 100 mg, 100 mg, Oral, Daily, Hugelmeyer, Alexis, DO .   magnesium citrate solution 1 Bottle, 1 Bottle, Oral, Once PRN, Hugelmeyer, Alexis, DO .  metroNIDAZOLE (FLAGYL) IVPB 500 mg, 500 mg, Intravenous, Q8H, Hugelmeyer, Alexis, DO, Stopped at 07/06/17 6659 .  multivitamin-lutein (OCUVITE-LUTEIN) capsule 1 capsule, 1 capsule, Oral, Daily, Hugelmeyer, Alexis, DO .  ondansetron (ZOFRAN) tablet 4 mg, 4 mg, Oral, Q6H PRN **OR** ondansetron (ZOFRAN) injection 4 mg, 4 mg, Intravenous, Q6H PRN, Hugelmeyer, Alexis, DO .  ondansetron (ZOFRAN-ODT) disintegrating tablet 4 mg, 4 mg, Oral, Q8H PRN, Hugelmeyer, Alexis, DO .  oxyCODONE (Oxy IR/ROXICODONE) immediate release tablet 5 mg, 5 mg, Oral, Q4H PRN, Hugelmeyer, Alexis, DO .  senna-docusate (Senokot-S) tablet 1 tablet, 1 tablet, Oral, QHS PRN, Hugelmeyer, Alexis, DO .  sodium chloride 0.9 % bolus 1,000 mL, 1,000 mL, Intravenous, Once, Mariea Clonts, Anne-Caroline, MD .  sucralfate (CARAFATE) tablet 1 g, 1 g, Oral, TID WC & HS, Hugelmeyer, Alexis, DO  Physical exam:  Vitals:   07/05/17 1345  BP: 121/74  Pulse: (!) 103  Resp: 18  Temp: 97.8 F (36.6 C)  TempSrc: Tympanic  Weight: 118 lb 6.4 oz (53.7 kg)  Height: 5' (1.524 m)   Physical Exam   GENERAL: Well-nourished well-developed; Alert, minimal pain but sitting comfortable in a chair. Accompanied by her daughter.  EYES: no  pallor or icterus OROPHARYNX: no thrush or ulceration; good dentition  NECK: supple, no masses felt LYMPH:  no palpable lymphadenopathy in the cervical, axillary or inguinal regions LUNGS: clear to auscultation and  No wheeze or crackles HEART/CVS: regular rate & rhythm and no murmurs; No lower extremity edema ABDOMEN: abdomen soft with normal bowel sounds. 48/18 umbilical pain. + for rebound tenderness and guarding. Musculoskeletal:no cyanosis of digits and no clubbing  PSYCH: alert & oriented x 3 with fluent speech NEURO: no focal motor/sensory deficits SKIN:  no rashes or significant lesions  CMP Latest Ref Rng & Units 07/06/2017    Glucose 65 - 99 mg/dL 124(H)  BUN 6 - 20 mg/dL 20  Creatinine 0.44 - 1.00 mg/dL 0.69  Sodium 135 - 145 mmol/L 132(L)  Potassium 3.5 - 5.1 mmol/L 3.3(L)  Chloride 101 - 111 mmol/L 100(L)  CO2 22 - 32 mmol/L 27  Calcium 8.9 - 10.3 mg/dL 8.1(L)  Total Protein 6.5 - 8.1 g/dL -  Total Bilirubin 0.3 - 1.2 mg/dL -  Alkaline Phos 38 - 126 U/L -  AST 15 - 41 U/L -  ALT 14 - 54 U/L -   CBC Latest Ref Rng & Units 07/06/2017  WBC 3.6 - 11.0 K/uL 15.4(H)  Hemoglobin 12.0 - 16.0 g/dL 10.9(L)  Hematocrit 35.0 - 47.0 % 31.3(L)  Platelets 150 - 440 K/uL 285    No images are attached to the encounter.  Ct Abdomen Pelvis W Contrast  Result Date: 07/05/2017 CLINICAL DATA:  Lower abdominal pain, couple days of diarrhea, history of esophageal cancer post radiation therapy and chemotherapy that ended in May, additional history of hypertension, former smoker EXAM: CT ABDOMEN AND PELVIS WITH CONTRAST TECHNIQUE: Multidetector CT imaging of the abdomen and pelvis was performed using the standard protocol following bolus administration of intravenous contrast. Sagittal and coronal MPR images reconstructed from axial data set. CONTRAST:  135mL ISOVUE-300 IOPAMIDOL (ISOVUE-300) INJECTION 61% IV. No oral contrast. COMPARISON:  CT abdomen and pelvis 03/14/2017; PET-CT 03/24/2017 FINDINGS: Lower chest: 8 x 5 mm irregular nodule LEFT lower lobe image 10, previously 9 x 7 mm. Linear subsegmental atelectasis RIGHT lower lobe. Hepatobiliary: Probable mild fatty infiltration of liver adjacent to falciform fissure. Gallbladder and liver otherwise normal appearance Pancreas: Normal appearance Spleen: Normal appearance Adrenals/Urinary Tract: Minimal thickening of adrenal glands without discrete mass. Small cyst upper pole LEFT kidney image 26. Enhancing mass within the posterior LEFT kidney, 21 x 20 mm cyst with washout on delayed images question renal neoplasm, grossly unchanged. No additional year tarry mass. No hydronephrosis  or hydroureter. Bladder unremarkable. Stomach/Bowel: Wall thickening of distal esophagus likely related to patient's known tumor. Stomach decompressed. Small bowel loops unremarkable. Appendix not localized but no definite evidence of a pericecal inflammatory process is seen. Wall thickening of sigmoid colon with mild hazy infiltration of pericolic fat, few sigmoid diverticular noted, question colitis or diverticulitis. No evidence of perforation or abscess. Remainder of colon unremarkable. Vascular/Lymphatic: Scattered pelvic phleboliths. Atherosclerotic calcifications aorta and iliac arteries as well as within celiac axis and SMA. No aortic aneurysm. Few normal sized retroperitoneal nodes. No definite adenopathy. Reproductive: Atrophic uterus and ovaries. Other: No free air or free fluid.  No hernia. Musculoskeletal: Diffuse osseous demineralization. No focal bone lesions. IMPRESSION: Wall thickening of the sigmoid colon with hazy infiltration of pericolic fat and note of scattered sigmoid diverticula, question diverticulitis versus colitis. Wall thickening of the distal esophagus likely related to patient's primary tumor. Slight decrease in size of a LEFT  lower lobe pulmonary nodule. Enhancing mass LEFT kidney 21 x 20 mm question neoplasm not significantly changed. Aortic Atherosclerosis (ICD10-I70.0). Electronically Signed   By: Lavonia Dana M.D.   On: 07/05/2017 18:23   Dg Chest Port 1 View  Result Date: 07/03/2017 CLINICAL DATA:  Cough and shortness of breath.  Esophageal carcinoma EXAM: PORTABLE CHEST 1 VIEW COMPARISON:  PET-CT March 24, 2017; chest radiograph December 31, 2013 FINDINGS: There is no edema or consolidation. Heart size is upper normal with pulmonary vascularity within normal limits. No adenopathy evident. There is aortic atherosclerosis. There is calcification in each carotid artery. There is no obvious esophageal thickening by radiography. No bone lesions. IMPRESSION: No edema or  consolidation. No evident adenopathy. Aortic atherosclerosis. There is carotid artery calcification bilaterally. Aortic Atherosclerosis (ICD10-I70.0). Electronically Signed   By: Lowella Grip III M.D.   On: 07/03/2017 16:21     Assessment and plan- Patient is a 83 y.o. female who presents for dehydration and 10/10 abdominal pain. She has hyponatremia. Na 129.  1. STAT CT scan of abdomen. She was taken to the emergency department to be evaluated and to get a quick scan and IV fluids. Will wait for results of scan. 2. Pain: Continue Norco 5-325 q 6 hours PRN.  3. Nausea: Continue Zofran 4 mg q 8 hours PRN.  4. Return as previously scheduled to see Dr. Jacinto Reap on Wednesday with PET scan.    Visit Diagnosis 1. Periumbilical abdominal pain     Patient expressed understanding and was in agreement with this plan. She also understands that She can call clinic at any time with any questions, concerns, or complaints.    Marisue Humble [facility] Pager- [phone] 07/06/2017 8:28 AM

## 2017-07-05 NOTE — ED Notes (Signed)
Chemo completed in May

## 2017-07-05 NOTE — Progress Notes (Signed)
patient denies any pain. c/o abd. pain x 2 night. rated pain at 8/9 before she took her last pain tablet. Last hydrocodone tablet taken 12 noon. Pt reports relief of abdominal cramping. Pt reports recent ED visit this weekend. Pt has not vomited since the ED visit. Pt reports extreme weakness/fatigue.  She states for the last two nights, "I am lying in a fetal position in abdominal cramps."

## 2017-07-05 NOTE — ED Notes (Signed)
Labs were done in [place] [facility]. Added on rest of abdominal protocols.

## 2017-07-05 NOTE — ED Provider Notes (Signed)
[facility] Emergency Department Provider Note  ____________________________________________  Time seen: Approximately 5:03 PM  I have reviewed the triage vital signs and the nursing notes.   HISTORY  Chief Complaint Abdominal Pain    HPI Brittney Jackson is a 83 y.o. female with a history of esophageal cancer, HTN, chronic hyponatremia, presenting from her oncology office for abdominal cramping, diarrhea, concerns of dehydration. The patient reports that she has an esophageal stricture which makes it very difficult for her to eat and drink. Sometimes she treats this with Zofran and is able to drink, but over the past couple of days she has been unable to take anything by mouth. For the past 3-4 days, the patient has had lower abdominal cramping sensation in diarrhea that is nonbloody. Her pain was relieved with Vicodin. Over the past day, she has had no diarrhea, "but I'm not taking anything by mouth either so there is nothing left." She has no sick contacts, she completed radiation therapy and chemotherapy in May, but she has been recently treated with azithromycin for a sinus infection. Today, she was found to be hyponatremic to 129 at clinic.   Past Medical History:  Diagnosis Date  . Arthritis    left knee  . Cancer ([place])    esophageal cancer  . Function kidney decreased   . Hypertension   . Hypothyroidism   . Inappropriate ADH secretion (HCC)   . Low sodium levels   . Osteoporosis   . Renal disorder     Patient Active Problem List   Diagnosis Date Noted  . Abdominal pain 07/05/2017  . Hypokalemia 07/02/2017  . Dehydration 05/28/2017  . Malignant neoplasm of lower third of esophagus ([place]) 03/19/2017  . Dysphagia 03/06/2017  . Breast cancer screening 03/06/2017  . S/P left cataract extraction 09/01/2015  . Insomnia 08/30/2014  . Hyposmolality and/or hyponatremia 01/09/2014  . Routine general medical examination at a health care facility  01/24/2013  . Osteopenia 04/26/2012  . White coat hypertension 01/22/2012  . Hypothyroidism 01/22/2012  . Hyperlipidemia with target LDL less than 100 01/22/2012    Past Surgical History:  Procedure Laterality Date  . CATARACT EXTRACTION W/PHACO Left 05/29/2015   Procedure: CATARACT EXTRACTION PHACO AND INTRAOCULAR LENS PLACEMENT (IOC);  Surgeon: Leandrew Koyanagi, MD;  Location: [place];  Service: Ophthalmology;  Laterality: Left;  . CATARACT EXTRACTION W/PHACO Right 01/15/2016   Procedure: CATARACT EXTRACTION PHACO AND INTRAOCULAR LENS PLACEMENT (IOC);  Surgeon: Leandrew Koyanagi, MD;  Location: [place];  Service: Ophthalmology;  Laterality: Right;  SHUGARCAINE  . ESOPHAGOGASTRODUODENOSCOPY (EGD) WITH PROPOFOL N/A 03/16/2017   Procedure: ESOPHAGOGASTRODUODENOSCOPY (EGD) WITH PROPOFOL;  Surgeon: [name], MD;  Location: [facility] ENDOSCOPY;  Service: Endoscopy;  Laterality: N/A;  . ESOPHAGOGASTRODUODENOSCOPY (EGD) WITH PROPOFOL N/A 06/25/2017   Procedure: ESOPHAGOGASTRODUODENOSCOPY (EGD) WITH PROPOFOL;  Surgeon: Jonathon Bellows, MD;  Location: [facility] ENDOSCOPY;  Service: Endoscopy;  Laterality: N/A;  . TONSILLECTOMY      Current Outpatient Rx  . Order #: [id number] Class: Historical Med  . Order #: [id number] Class: Normal  . Order #: [id number] Class: Historical Med  . Order #: [id number] Class: Normal  . Order #: [id number] Class: Print  . Order #: [id number] Class: Phone In  . Order #: [id number] Class: Normal  . Order #: [id number] Class: Normal  . Order #: [id number] Class: Historical Med  . Order #: [id number] Class: Print  . Order #: [id number] Class: Normal  . Order #: [id number] Class: Normal    Allergies Amlodipine  Family History  Problem Relation Age of Onset  .  Heart attack Mother   . Cancer Maternal Uncle        renal cancer   . COPD Neg Hx        no breast, ovarian or colon    Social History Social History  Substance Use Topics  . Smoking status: Former Smoker     Types: Cigarettes    Quit date: 07/21/2000  . Smokeless tobacco: Never Used  . Alcohol use No    Review of Systems Constitutional: No fever/chills.No lightheadedness or syncope. Positive generalized malaise. Eyes: No visual changes. ENT: No sore throat. No congestion or rhinorrhea. Cardiovascular: Denies chest pain. Denies palpitations. Respiratory: Denies shortness of breath.  No cough. Gastrointestinal: Positive lower abdominal cramping.  Positive chronic unchanged nausea, no vomiting.  Positive diarrhea.  No constipation. Genitourinary: Negative for dysuria. Musculoskeletal: Negative for back pain. Skin: Negative for rash. Neurological: Negative for headaches. No focal numbness, tingling or weakness.     ____________________________________________   PHYSICAL EXAM:  VITAL SIGNS: ED Triage Vitals  Enc Vitals Group     BP 07/05/17 1449 (!) 149/66     Pulse Rate 07/05/17 1449 98     Resp 07/05/17 1449 20     Temp 07/05/17 1449 97.7 F (36.5 C)     Temp Source 07/05/17 1449 Oral     SpO2 07/05/17 1449 97 %     Weight 07/05/17 1449 118 lb (53.5 kg)     Height 07/05/17 1449 5' (1.524 m)     Head Circumference --      Peak Flow --      Pain Score 07/05/17 1448 0     Pain Loc --      Pain Edu? --      Excl. in [place]? --     Constitutional: Alert and oriented. Well appearing and in no acute distress. Answers questions appropriately. Eyes: Conjunctivae are normal.  EOMI. No scleral icterus. Head: Atraumatic. Nose: No congestion/rhinnorhea. Mouth/Throat: Mucous membranes are Mildly dry.  Neck: No stridor.  Supple.  No JVD. No meningismus. Cardiovascular: Normal rate, regular rhythm. No murmurs, rubs or gallops.  Respiratory: Normal respiratory effort.  No accessory muscle use or retractions. Lungs CTAB.  No wheezes, rales or ronchi. Gastrointestinal: Soft, and mildly distended in the lower abdomen. The patient has some diffuse nonfocal tenderness to palpation in the lower  abdomen..  No guarding or rebound.  No peritoneal signs. Musculoskeletal: No LE edema.  Neurologic:  A&Ox3.  Speech is clear.  Face and smile are symmetric.  EOMI.  Moves all extremities well. Skin:  Skin is warm, dry and intact. No rash noted. Psychiatric: Mood and affect are normal.  ____________________________________________   LABS (all labs ordered are listed, but only abnormal results are displayed)  Labs Reviewed  URINALYSIS, COMPLETE (UACMP) WITH MICROSCOPIC - Abnormal; Notable for the following:       Result Value   Color, Urine AMBER (*)    APPearance HAZY (*)    Ketones, ur 20 (*)    Protein, ur 30 (*)    Nitrite POSITIVE (*)    Leukocytes, UA SMALL (*)    Bacteria, UA MANY (*)    Squamous Epithelial / LPF 0-5 (*)    All other components within normal limits  HEPATIC FUNCTION PANEL - Abnormal; Notable for the following:    Albumin 3.3 (*)    All other components within normal limits  C DIFFICILE QUICK SCREEN W PCR REFLEX  GASTROINTESTINAL PANEL BY PCR, STOOL (REPLACES STOOL CULTURE)  URINE CULTURE  LIPASE, BLOOD   ____________________________________________  EKG  ED ECG REPORT I, Eula Listen, the attending physician, personally viewed and interpreted this ECG.   Date: 07/05/2017  EKG Time: 1719  Rate: 84  Rhythm: normal sinus rhythm  Axis: normal  Intervals:none  ST&T Change: No STEMI  ____________________________________________  RADIOLOGY  Ct Abdomen Pelvis W Contrast  Result Date: 07/05/2017 CLINICAL DATA:  Lower abdominal pain, couple days of diarrhea, history of esophageal cancer post radiation therapy and chemotherapy that ended in May, additional history of hypertension, former smoker EXAM: CT ABDOMEN AND PELVIS WITH CONTRAST TECHNIQUE: Multidetector CT imaging of the abdomen and pelvis was performed using the standard protocol following bolus administration of intravenous contrast. Sagittal and coronal MPR images reconstructed from  axial data set. CONTRAST:  155mL ISOVUE-300 IOPAMIDOL (ISOVUE-300) INJECTION 61% IV. No oral contrast. COMPARISON:  CT abdomen and pelvis 03/14/2017; PET-CT 03/24/2017 FINDINGS: Lower chest: 8 x 5 mm irregular nodule LEFT lower lobe image 10, previously 9 x 7 mm. Linear subsegmental atelectasis RIGHT lower lobe. Hepatobiliary: Probable mild fatty infiltration of liver adjacent to falciform fissure. Gallbladder and liver otherwise normal appearance Pancreas: Normal appearance Spleen: Normal appearance Adrenals/Urinary Tract: Minimal thickening of adrenal glands without discrete mass. Small cyst upper pole LEFT kidney image 26. Enhancing mass within the posterior LEFT kidney, 21 x 20 mm cyst with washout on delayed images question renal neoplasm, grossly unchanged. No additional year tarry mass. No hydronephrosis or hydroureter. Bladder unremarkable. Stomach/Bowel: Wall thickening of distal esophagus likely related to patient's known tumor. Stomach decompressed. Small bowel loops unremarkable. Appendix not localized but no definite evidence of a pericecal inflammatory process is seen. Wall thickening of sigmoid colon with mild hazy infiltration of pericolic fat, few sigmoid diverticular noted, question colitis or diverticulitis. No evidence of perforation or abscess. Remainder of colon unremarkable. Vascular/Lymphatic: Scattered pelvic phleboliths. Atherosclerotic calcifications aorta and iliac arteries as well as within celiac axis and SMA. No aortic aneurysm. Few normal sized retroperitoneal nodes. No definite adenopathy. Reproductive: Atrophic uterus and ovaries. Other: No free air or free fluid.  No hernia. Musculoskeletal: Diffuse osseous demineralization. No focal bone lesions. IMPRESSION: Wall thickening of the sigmoid colon with hazy infiltration of pericolic fat and note of scattered sigmoid diverticula, question diverticulitis versus colitis. Wall thickening of the distal esophagus likely related to  patient's primary tumor. Slight decrease in size of a LEFT lower lobe pulmonary nodule. Enhancing mass LEFT kidney 21 x 20 mm question neoplasm not significantly changed. Aortic Atherosclerosis (ICD10-I70.0). Electronically Signed   By: Lavonia Dana M.D.   On: 07/05/2017 18:23    ____________________________________________   PROCEDURES  Procedure(s) performed: None  Procedures  Critical Care performed: No ____________________________________________   INITIAL IMPRESSION / ASSESSMENT AND PLAN / ED COURSE  Pertinent labs & imaging results that were available during my care of the patient were reviewed by me and considered in my medical decision making (see chart for details).  83 y.o. female with a history of esophageal cancer and chronic nausea with decreased by mouth intake presenting with 3-4 days of lower abdominal cramping and diarrhea, hyponatremia. Overall, the patient is hemodynamically stable and afebrile. Her abdominal examination shows diffuse tenderness, but we will get a CT scan for further evaluation. Additionally, we'll give the patient Zofran and intravenous fluids and we will recheck her sodium. Plan reevaluation for final disposition.  ----------------------------------------- 6:16 PM on 07/05/2017 -----------------------------------------  The patient is positive for urinary tract infection. A culture has been sent and  the patient will receive IV Rocephin here. I'm awaiting the results of her CT scan.  Patient is resting comfortably at this time.  Her pain has resolved.  ----------------------------------------- 7:08 PM on 07/05/2017 -----------------------------------------  The patient continues to be comfortable and hemodynamically stable. Her CT scan does show inflammation around the sigmoid colon and may be consistent with colitis versus diverticulitis. Given that she has 2 different infection and hyponatremia with the inability to take significant oral intake  at home, I'll plan admission at this time.  ____________________________________________  FINAL CLINICAL IMPRESSION(S) / ED DIAGNOSES  Final diagnoses:  Diverticulitis  Colitis  Hyponatremia  Acute cystitis without hematuria         NEW MEDICATIONS STARTED DURING THIS VISIT:  New Prescriptions   No medications on file      Eula Listen, MD 07/05/17 Einar Crow

## 2017-07-05 NOTE — ED Triage Notes (Addendum)
Pt c/o lower abdominal pain.  Marland Kitchen  Has had couple days of diarrhea.  No urinary sx.  Hx esophageal cancer s/p RT and chemo ending in May.  Does have esophageal stricture and is unable to pass solids at times, will vomit them back up.  Only abdominal pain is new symptom.  Was at [facility] doctor for fluids r/t dehydration but they sent her to ED for eval of abdominal pain.  handling secretions at this time. No respiratory difficulties.

## 2017-07-05 NOTE — Progress Notes (Signed)
Pharmacy Antibiotic Note  Brittney Jackson is a 83 y.o. female admitted on 07/05/2017 with UTI.  Pharmacy has been consulted for ciprofloxacin dosing.  Plan: Ciprofloxacin 400 mg q 24 hours ordered.  Height: 5' (152.4 cm) Weight: 118 lb (53.5 kg) IBW/kg (Calculated) : 45.5  Temp (24hrs), Avg:97.8 F (36.6 C), Min:97.7 F (36.5 C), Max:97.8 F (36.6 C)   Recent Labs Lab 07/01/17 1050 07/03/17 1532 07/05/17 1325  WBC 7.4 7.3  --   CREATININE 0.96 0.90 1.00    Estimated Creatinine Clearance: 29.5 mL/min (by C-G formula based on SCr of 1 mg/dL).    Allergies  Allergen Reactions  . Amlodipine Swelling    Antimicrobials this admission: Ciprofloxacin  metronidazole 7/9>>    >>   Dose adjustments this admission:   Microbiology results: 7/09 GI Cx: pending   Thank you for allowing pharmacy to be a part of this patient's care.  Zalia Hautala S 07/05/2017 10:01 PM

## 2017-07-05 NOTE — Telephone Encounter (Signed)
Patient daughter states she was in hospital over the week wend and needs IVF today. Please advise

## 2017-07-05 NOTE — Telephone Encounter (Signed)
Per [facility] Scotland -also received a msg from [place], NP from palliative care spoke with patient's daughter this morning. Pt feels very weak. Per Dr. Marcello Moores will come at 115 for labs, see Sonia Baller, NP at 130pm and IV fluids at 2pm.  Erline Levine to call daughter Jeani Hawking with apts.

## 2017-07-05 NOTE — H&P (Signed)
History and Physical   [facility] -  @ [facility] Admission History and Physical [employer], D.O.    Patient Name: Brittney Jackson MR#: [id number] Date of Birth: 11-27-1930 Date of Admission: 07/05/2017  Referring MD/NP/PA: Dr. Mariea Clonts Primary Care Physician: Crecencio Mc, MD   Chief Complaint:  Chief Complaint  Patient presents with  . Abdominal Pain    HPI: Brittney Jackson is a 83 y.o. female with a history of esophageal cancerWith stricture status post chemotherapy and radiation therapy, HTN, chronic hyponatremia, presenting from her oncology office for abdominal cramping, diarrhea, concerns of dehydration. Patient was seen in this emergency department with complaints of dehydration and hypokalemia for which she was treated and discharged home in stable condition. Seen in oncology clinic today and complained of weakness, fatigue, abdominal pain and dehydration as well as nausea.  Abdominal pain began 2 days ago and described as dull, diffuse, worse with PO intake. She has had decreased by mouth intake over the past 2 days secondary to abdominal pain and nausea. She has also had some scant diarrhea. Patient denies fevers/chills, weakness, dizziness, chest pain, shortness of breath, dysuria/frequency, changes in mental status  Of note Patient completed radiation therapy on 05/17/2017 and completed chemotherapy on 05/07/2017. She did recently have a course of azithromycin for sinus infection. She has a PET scan pending this Wednesday to determine whether or not she'll have a PEG tube placed. Palliative care has been involved. She is also followed by Dr. Holley Raring and has a history of hyponatremia. She did have a hospitalization where she was diagnosed and treated for SIADH.  Otherwise there has been no change in status. Patient has been taking medication as prescribed and there has been no recent change in medication or diet.  There has been no recent other illness,  hospitalizations, travel or sick contacts.    EMS/ED Course: Patient received Rocephin, Cipro, Corco, Flagyl, Zofran, NS.  Medical admission was requested for ongoing management of dehydration, hyponatremia, diverticulitis and UTI.  Review of Systems:  CONSTITUTIONAL: No fever/chills, fatigue, weakness, weight gain/loss, headache. EYES: No blurry or double vision. ENT: No tinnitus, postnasal drip, redness or soreness of the oropharynx. RESPIRATORY: No cough, dyspnea, wheeze.  No hemoptysis.  CARDIOVASCULAR: No chest pain, palpitations, syncope, orthopnea. No lower extremity edema.  GASTROINTESTINAL: Positive nausea and abdominal pain, diarrhea per history of present illness No vomiting, constipation.  No hematemesis, melena or hematochezia. GENITOURINARY: No dysuria, frequency, hematuria. ENDOCRINE: No polyuria or nocturia. No heat or cold intolerance. HEMATOLOGY: No anemia, bruising, bleeding. INTEGUMENTARY: No rashes, ulcers, lesions. MUSCULOSKELETAL: No arthritis, gout, dyspnea. NEUROLOGIC: No numbness, tingling, ataxia, seizure-type activity, weakness.   Past Medical History:  Diagnosis Date  . Arthritis    left knee  . Cancer ([place])    esophageal cancer  . Function kidney decreased   . Hypertension   . Hypothyroidism   . Inappropriate ADH secretion (HCC)   . Low sodium levels   . Osteoporosis   . Renal disorder     Past Surgical History:  Procedure Laterality Date  . CATARACT EXTRACTION W/PHACO Left 05/29/2015   Procedure: CATARACT EXTRACTION PHACO AND INTRAOCULAR LENS PLACEMENT (IOC);  Surgeon: Leandrew Koyanagi, MD;  Location: [place];  Service: Ophthalmology;  Laterality: Left;  . CATARACT EXTRACTION W/PHACO Right 01/15/2016   Procedure: CATARACT EXTRACTION PHACO AND INTRAOCULAR LENS PLACEMENT (IOC);  Surgeon: Leandrew Koyanagi, MD;  Location: [place];  Service: Ophthalmology;  Laterality: Right;  SHUGARCAINE  . ESOPHAGOGASTRODUODENOSCOPY (EGD)  WITH PROPOFOL N/A 03/16/2017   Procedure: ESOPHAGOGASTRODUODENOSCOPY (EGD) WITH PROPOFOL;  Surgeon: [name], MD;  Location: [facility] ENDOSCOPY;  Service: Endoscopy;  Laterality: N/A;  . ESOPHAGOGASTRODUODENOSCOPY (EGD) WITH PROPOFOL N/A 06/25/2017   Procedure: ESOPHAGOGASTRODUODENOSCOPY (EGD) WITH PROPOFOL;  Surgeon: Jonathon Bellows, MD;  Location: [facility] ENDOSCOPY;  Service: Endoscopy;  Laterality: N/A;  . TONSILLECTOMY       reports that she quit smoking about 16 years ago. Her smoking use included Cigarettes. She has never used smokeless tobacco. She reports that she does not drink alcohol or use drugs.  Allergies  Allergen Reactions  . Amlodipine Swelling    Family History  Problem Relation Age of Onset  . Heart attack Mother   . Cancer Maternal Uncle        renal cancer   . COPD Neg Hx        no breast, ovarian or colon    Prior to Admission medications   Medication Sig Start Date End Date Taking? Authorizing Provider  azithromycin (ZITHROMAX Z-PAK) 250 MG tablet 1 -Z-pack, follow package directions 06/29/17  Yes Chrystal, Eulas Post, MD  HYDROcodone-acetaminophen (NORCO) 5-325 MG tablet Take 1 tablet by mouth every 6 (six) hours as needed for moderate pain. 06/11/17  Yes Sindy Guadeloupe, MD  ondansetron (ZOFRAN ODT) 4 MG disintegrating tablet Take 1 tablet (4 mg total) by mouth every 8 (eight) hours as needed for nausea or vomiting. 07/03/17  Yes Darel Hong, MD  aspirin 81 MG tablet Take 81 mg by mouth daily.    [provider]  cholecalciferol (VITAMIN D) 1000 UNITS tablet Take 1,000 Units by mouth daily.    [provider]  dexamethasone (DECADRON) 4 MG tablet Take 1 tablet (4 mg total) by mouth 2 (two) times daily. Patient not taking: Reported on 07/01/2017 05/28/17   Cammie Sickle, MD  labetalol (NORMODYNE) 300 MG tablet Take 0.5 tablets (150 mg total) by mouth 3 (three) times daily. 1/2 tab (150 mg) Am and PM Patient not taking: Reported on 07/05/2017 02/28/16   Crecencio Mc, MD  levothyroxine (SYNTHROID, LEVOTHROID) 88 MCG tablet TAKE ONE (1) TABLET EACH DAY 05/19/17   Crecencio Mc, MD  losartan (COZAAR) 100 MG tablet TAKE ONE (1) TABLET BY MOUTH EVERY DAY Patient not taking: Reported on 07/01/2017 12/23/16   Crecencio Mc, MD  Multiple Vitamins-Minerals (PRESERVISION AREDS PO) Take by mouth daily.    [provider]  simvastatin (ZOCOR) 40 MG tablet TAKE ONE (1) TABLET AT BEDTIME Patient not taking: Reported on 03/19/2017 09/14/16   Crecencio Mc, MD  sucralfate (CARAFATE) 1 g tablet Take 1 tablet (1 g total) by mouth 4 (four) times daily -  with meals and at bedtime. Dissolve in warm water, swish and swallow Patient not taking: Reported on 07/05/2017 04/06/17   Noreene Filbert, MD    Physical Exam: Vitals:   07/05/17 1449 07/05/17 1830  BP: (!) 149/66 (!) 162/67  Pulse: 98 91  Resp: 20 17  Temp: 97.7 F (36.5 C)   TempSrc: Oral   SpO2: 97% 94%  Weight: 53.5 kg (118 lb)   Height: 5' (1.524 m)     GENERAL: 83 y.o.-year-old female patient, well-developed, well-nourished lying in the bed in no acute distress.  Pleasant and cooperative.   HEENT: Head atraumatic, normocephalic. Pupils equal, round, reactive to light and accommodation. No scleral icterus. Extraocular muscles intact.. Mucus membranes moist. NECK: Supple, full range of motion.  CHEST: Normal breath sounds bilaterally. No wheezing,  rales, rhonchi or crackles. No use of accessory muscles of respiration.  No reproducible chest wall tenderness.  CARDIOVASCULAR: S1, S2 normal. No murmurs, rubs, or gallops. Cap refill <2 seconds. Pulses intact distally.  ABDOMEN: Soft, nondistended, mild tenderness diffusely No rebound, guarding, rigidity. Normoactive bowel sounds present in all four quadrants.  EXTREMITIES: No pedal edema, cyanosis, or clubbing. No calf tenderness or Homan's sign.  NEUROLOGIC: The patient is alert and oriented x 3. Cranial nerves II through XII are grossly intact  with no focal sensorimotor deficit.  PSYCHIATRIC:  Normal affect, mood, thought content. SKIN: Warm, dry, and intact without obvious rash, lesion, or ulcer.    Labs on Admission:  CBC:  Recent Labs Lab 07/01/17 1050 07/03/17 1532  WBC 7.4 7.3  NEUTROABS 5.4 5.0  HGB 11.9* 12.3  HCT 33.5* 35.1  MCV 88.1 88.1  PLT 369 532   Basic Metabolic Panel:  Recent Labs Lab 07/01/17 1050 07/03/17 1532 07/05/17 1325  NA 130* 131* 129*  K 3.1* 3.1* 3.5  CL 92* 93* 95*  CO2 28 29 27   GLUCOSE 104* 120* 130*  BUN 18 16 24*  CREATININE 0.96 0.90 1.00  CALCIUM 9.1 9.1 9.3  MG  --   --  1.8   GFR: Estimated Creatinine Clearance: 29.5 mL/min (by C-G formula based on SCr of 1 mg/dL). Liver Function Tests:  Recent Labs Lab 07/03/17 1532 07/05/17 1655  AST 37 25  ALT 24 20  ALKPHOS 108 110  BILITOT 0.6 0.6  PROT 7.3 7.1  ALBUMIN 3.5 3.3*    Recent Labs Lab 07/01/17 1050 07/03/17 1532 07/05/17 1655  LIPASE  --  30 22  AMYLASE 45  --   --    No results for input(s): AMMONIA in the last 168 hours. Coagulation Profile:  Recent Labs Lab 07/03/17 1532  INR 0.95   Cardiac Enzymes: No results for input(s): CKTOTAL, CKMB, CKMBINDEX, TROPONINI in the last 168 hours. BNP (last 3 results) No results for input(s): PROBNP in the last 8760 hours. HbA1C: No results for input(s): HGBA1C in the last 72 hours. CBG: No results for input(s): GLUCAP in the last 168 hours. Lipid Profile: No results for input(s): CHOL, HDL, LDLCALC, TRIG, CHOLHDL, LDLDIRECT in the last 72 hours. Thyroid Function Tests: No results for input(s): TSH, T4TOTAL, FREET4, T3FREE, THYROIDAB in the last 72 hours. Anemia Panel: No results for input(s): VITAMINB12, FOLATE, FERRITIN, TIBC, IRON, RETICCTPCT in the last 72 hours. Urine analysis:    Component Value Date/Time   COLORURINE AMBER (A) 07/05/2017 1449   APPEARANCEUR HAZY (A) 07/05/2017 1449   APPEARANCEUR Cloudy 12/28/2013 1505   LABSPEC 1.027  07/05/2017 1449   LABSPEC 1.009 12/28/2013 1505   PHURINE 5.0 07/05/2017 1449   GLUCOSEU NEGATIVE 07/05/2017 1449   GLUCOSEU Negative 12/28/2013 1505   HGBUR NEGATIVE 07/05/2017 1449   BILIRUBINUR NEGATIVE 07/05/2017 1449   BILIRUBINUR Negative 12/28/2013 1505   KETONESUR 20 (A) 07/05/2017 1449   PROTEINUR 30 (A) 07/05/2017 1449   NITRITE POSITIVE (A) 07/05/2017 1449   LEUKOCYTESUR SMALL (A) 07/05/2017 1449   LEUKOCYTESUR 3+ 12/28/2013 1505   Sepsis Labs: @LABRCNTIP (procalcitonin:4,lacticidven:4) )No results found for this or any previous visit (from the past 240 hour(s)).   Radiological Exams on Admission: Ct Abdomen Pelvis W Contrast  Result Date: 07/05/2017 CLINICAL DATA:  Lower abdominal pain, couple days of diarrhea, history of esophageal cancer post radiation therapy and chemotherapy that ended in May, additional history of hypertension, former smoker EXAM: CT ABDOMEN AND PELVIS  WITH CONTRAST TECHNIQUE: Multidetector CT imaging of the abdomen and pelvis was performed using the standard protocol following bolus administration of intravenous contrast. Sagittal and coronal MPR images reconstructed from axial data set. CONTRAST:  168mL ISOVUE-300 IOPAMIDOL (ISOVUE-300) INJECTION 61% IV. No oral contrast. COMPARISON:  CT abdomen and pelvis 03/14/2017; PET-CT 03/24/2017 FINDINGS: Lower chest: 8 x 5 mm irregular nodule LEFT lower lobe image 10, previously 9 x 7 mm. Linear subsegmental atelectasis RIGHT lower lobe. Hepatobiliary: Probable mild fatty infiltration of liver adjacent to falciform fissure. Gallbladder and liver otherwise normal appearance Pancreas: Normal appearance Spleen: Normal appearance Adrenals/Urinary Tract: Minimal thickening of adrenal glands without discrete mass. Small cyst upper pole LEFT kidney image 26. Enhancing mass within the posterior LEFT kidney, 21 x 20 mm cyst with washout on delayed images question renal neoplasm, grossly unchanged. No additional year tarry mass.  No hydronephrosis or hydroureter. Bladder unremarkable. Stomach/Bowel: Wall thickening of distal esophagus likely related to patient's known tumor. Stomach decompressed. Small bowel loops unremarkable. Appendix not localized but no definite evidence of a pericecal inflammatory process is seen. Wall thickening of sigmoid colon with mild hazy infiltration of pericolic fat, few sigmoid diverticular noted, question colitis or diverticulitis. No evidence of perforation or abscess. Remainder of colon unremarkable. Vascular/Lymphatic: Scattered pelvic phleboliths. Atherosclerotic calcifications aorta and iliac arteries as well as within celiac axis and SMA. No aortic aneurysm. Few normal sized retroperitoneal nodes. No definite adenopathy. Reproductive: Atrophic uterus and ovaries. Other: No free air or free fluid.  No hernia. Musculoskeletal: Diffuse osseous demineralization. No focal bone lesions. IMPRESSION: Wall thickening of the sigmoid colon with hazy infiltration of pericolic fat and note of scattered sigmoid diverticula, question diverticulitis versus colitis. Wall thickening of the distal esophagus likely related to patient's primary tumor. Slight decrease in size of a LEFT lower lobe pulmonary nodule. Enhancing mass LEFT kidney 21 x 20 mm question neoplasm not significantly changed. Aortic Atherosclerosis (ICD10-I70.0). Electronically Signed   By: Lavonia Dana M.D.   On: 07/05/2017 18:23    EKG: Normal sinus rhythm at 84 bpm with normal axis and nonspecific ST-T wave changes.   Assessment/Plan  This is a 83 y.o. female with a history of esophageal cancer, HTN, chronic hyponatremia, now being admitted with:  #. Diverticulitis with diarrhea - Admit to inpatient with telemetry monitoring - IV antibiotics: Cipro, Flagyl  - Antipyretics and pain control - IV fluid hydration - Follow up blood, urine & stool cultures - Repeat CBC in am.   #. UTI - Cipro - Follow up cultures  #. Dehydration with  hyponatremia - IVFs - Recheck BMP in AM.  - Given history of SIADH consider early nephrology consult if sodium is decreasing. Patient follows with Dr. Holley Raring  #. History of hypothyroid  - Continue Synthroid  Admission status: Inpatient IV Fluids: NS Diet/Nutrition: Heart healthy Consults called: None  DVT Px: Lovenox, SCDs and early ambulation. Code Status: Full Code  Disposition Plan: To home in 1-2 days.   All the records are reviewed and case discussed with ED provider. Management plans discussed with the patient and/or family who express understanding and agree with plan of care.  Khady Vandenberg D.O. on 07/05/2017 at 8:43 PM Between 7am to 6pm - Pager - [phone] After 6pm go to [email or web address] - [employer] [facility] [place] [facility] Office [phone] CC: Primary care physician; Crecencio Mc, MD   07/05/2017, 8:43 PM

## 2017-07-06 DIAGNOSIS — K529 Noninfective gastroenteritis and colitis, unspecified: Secondary | ICD-10-CM | POA: Diagnosis not present

## 2017-07-06 DIAGNOSIS — N390 Urinary tract infection, site not specified: Secondary | ICD-10-CM | POA: Diagnosis not present

## 2017-07-06 DIAGNOSIS — K5792 Diverticulitis of intestine, part unspecified, without perforation or abscess without bleeding: Secondary | ICD-10-CM | POA: Diagnosis not present

## 2017-07-06 DIAGNOSIS — E871 Hypo-osmolality and hyponatremia: Secondary | ICD-10-CM | POA: Diagnosis not present

## 2017-07-06 LAB — BASIC METABOLIC PANEL
Anion gap: 5 (ref 5–15)
BUN: 20 mg/dL (ref 6–20)
CHLORIDE: 100 mmol/L — AB (ref 101–111)
CO2: 27 mmol/L (ref 22–32)
Calcium: 8.1 mg/dL — ABNORMAL LOW (ref 8.9–10.3)
Creatinine, Ser: 0.69 mg/dL (ref 0.44–1.00)
GFR calc Af Amer: >60 mL/min (ref >60)
GFR calc non Af Amer: >60 mL/min (ref >60)
Glucose, Bld: 124 mg/dL — ABNORMAL HIGH (ref 65–99)
Potassium: 3.3 mmol/L — ABNORMAL LOW (ref 3.5–5.1)
SODIUM: 132 mmol/L — AB (ref 135–145)

## 2017-07-06 LAB — GASTROINTESTINAL PANEL BY PCR, STOOL (REPLACES STOOL CULTURE)

## 2017-07-06 LAB — CBC
HEMATOCRIT: 31.3 % — AB (ref 35.0–47.0)
Hemoglobin: 10.9 g/dL — ABNORMAL LOW (ref 12.0–16.0)
MCH: 31.1 pg (ref 26.0–34.0)
MCHC: 34.9 g/dL (ref 32.0–36.0)
MCV: 89.2 fL (ref 80.0–100.0)
Platelets: 285 10*3/uL (ref 150–440)
RBC: 3.51 MIL/uL — AB (ref 3.80–5.20)
RDW: 16.2 % — AB (ref 11.5–14.5)
WBC: 15.4 10*3/uL — AB (ref 3.6–11.0)

## 2017-07-06 LAB — C DIFFICILE QUICK SCREEN W PCR REFLEX
C Diff antigen: NEGATIVE
C Diff interpretation: NOT DETECTED
C Diff toxin: NEGATIVE

## 2017-07-06 MED ORDER — METRONIDAZOLE 50 MG/ML ORAL SUSPENSION: 500.0000 mg | Freq: Three times a day (TID) | ORAL | 0 refills | Status: DC

## 2017-07-06 MED ORDER — CIPROFLOXACIN 500 MG/5ML (10%) PO SUSR: 500.0000 mg | Freq: Two times a day (BID) | ORAL | 0 refills | Status: DC

## 2017-07-06 MED ORDER — CIPROFLOXACIN IN D5W 400 MG/200ML IV SOLN
400.0000 mg | Freq: Two times a day (BID) | INTRAVENOUS | Status: AC
  Administered 2017-07-06: 400 mg via INTRAVENOUS
  Filled 2017-07-06: qty 200

## 2017-07-06 MED ORDER — METRONIDAZOLE 50 MG/ML ORAL SUSPENSION
500.0000 mg | Freq: Three times a day (TID) | ORAL | Status: DC
  Filled 2017-07-06: qty 10

## 2017-07-06 MED ORDER — METRONIDAZOLE IN NACL 5-0.79 MG/ML-% IV SOLN
500.0000 mg | Freq: Three times a day (TID) | INTRAVENOUS | Status: AC
  Administered 2017-07-06: 500 mg via INTRAVENOUS
  Filled 2017-07-06: qty 100

## 2017-07-06 MED ORDER — ENOXAPARIN SODIUM 40 MG/0.4ML ~~LOC~~ SOLN: 40.0000 mg | SUBCUTANEOUS | Status: DC

## 2017-07-06 MED ORDER — CIPROFLOXACIN 500 MG/5ML (10%) PO SUSR
500.0000 mg | Freq: Two times a day (BID) | ORAL | Status: DC
  Filled 2017-07-06: qty 5

## 2017-07-06 MED ORDER — ENOXAPARIN SODIUM 30 MG/0.3ML ~~LOC~~ SOLN: 30.0000 mg | SUBCUTANEOUS | Status: DC

## 2017-07-06 NOTE — Evaluation (Signed)
Physical Therapy Evaluation Patient Details Name: Brittney Jackson MRN: [id number] DOB: [birth year] Today's Date: 07/06/2017   History of Present Illness  presented to ER secondary to abdominal pain, diarrhea and dehydration; admitted with diverticulitis, UTI.  Clinical Impression  Upon evaluation, patient alert and oriented; follows all commands and demonstrates good efforts with all therapy tasks.  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  Bilat Ue/LE strength and ROM grossly symmetrical and WFL for basic transfers/mobility.  Completes bed mobility indep; sit/stand, basic transfers and in-room ambulation (30') without assist device, mod indep.  Good safety, confidence without buckling or LOB.  Completes 5x sit/stand without assist device, without UE support in 12 seconds and demonstrates standing functional reach of >8" without difficulty, both indicative of minimal fall risk with functional activities. Patient appears to be at baseline level of functional ability without acute PT need identified.  Will complete initial order at this time; please re-consult should needs change. Daughter with concerns about patients long-term hydration, nutrition and support in the home; may benefit from [facility], aide and/or personal care services upon discharge. RNCM aware and to discuss further with patient/family.    Follow Up Recommendations No PT follow up    Equipment Recommendations       Recommendations for Other Services       Precautions / Restrictions Precautions Precautions: Fall Precaution Comments: contact iso Restrictions Weight Bearing Restrictions: No      Mobility  Bed Mobility Overal bed mobility: Independent                Transfers Overall transfer level: Modified independent                  Ambulation/Gait Ambulation/Gait assistance: Modified independent (Device/Increase time) Ambulation Distance (Feet): 30 Feet Assistive device: None       General Gait Details:  reciprocal stepping pattern with good trunk rotation, arm swing, cadence and overall speed/confidence in movement.  Completes change direction with normalized radius, no LOB.  Stairs            Wheelchair Mobility    Modified Rankin (Stroke Patients Only)       Balance Overall balance assessment: Needs assistance Sitting-balance support: No upper extremity supported;Feet supported Sitting balance-Leahy Scale: Normal     Standing balance support: No upper extremity supported Standing balance-Leahy Scale: Good Standing balance comment: standing functional reach >8" with good control, good ability to return to upright.               High Level Balance Comments: 5x sit/stand without assist device, no UE support, 12 seconds.  Good LE strength/power, no LOB; good control indicative of very minimal fall risk with functional activities.             Pertinent Vitals/Pain Pain Assessment: No/denies pain    Home Living Family/patient expects to be discharged to:: Private residence Living Arrangements: Alone Available Help at Discharge: Family;Available PRN/intermittently Type of Home: House Home Access: Ramped entrance     Home Layout: One level Home Equipment: None      Prior Function Level of Independence: Independent         Comments: Indep with ADLs, household and community mobility; + driving, but daughter reports has not driven since completion of chemo/radiation in May, 2018.  Denies fall history.     Hand Dominance        Extremity/Trunk Assessment   Upper Extremity Assessment Upper Extremity Assessment: Overall WFL for tasks assessed    Lower Extremity Assessment Lower  Extremity Assessment: Overall WFL for tasks assessed (grossly at least 4+/5 throughout)       Communication   Communication: No difficulties  Cognition Arousal/Alertness: Awake/alert Behavior During Therapy: WFL for tasks assessed/performed Overall Cognitive Status: Within  Functional Limits for tasks assessed                                        General Comments      Exercises     Assessment/Plan    PT Assessment Patent does not need any further PT services  PT Problem List         PT Treatment Interventions      PT Goals (Current goals can be found in the Care Plan section)  Acute Rehab PT Goals Patient Stated Goal: to go home today PT Goal Formulation: All assessment and education complete, DC therapy    Frequency     Barriers to discharge        Co-evaluation               AM-PAC PT "6 Clicks" Daily Activity  Outcome Measure Difficulty turning over in bed (including adjusting bedclothes, sheets and blankets)?: None Difficulty moving from lying on back to sitting on the side of the bed? : None Difficulty sitting down on and standing up from a chair with arms (e.g., wheelchair, bedside commode, etc,.)?: None Help needed moving to and from a bed to chair (including a wheelchair)?: None Help needed walking in hospital room?: None Help needed climbing 3-5 steps with a railing? : None 6 Click Score: 24    End of Session   Activity Tolerance: Patient tolerated treatment well Patient left: in chair;with call bell/phone within reach;with nursing/sitter in room;with family/visitor present (nursing to connect alarm pad to box when complete) Nurse Communication: Mobility status PT Visit Diagnosis: Difficulty in walking, not elsewhere classified (R26.2)    Time: 1779-3903 PT Time Calculation (min) (ACUTE ONLY): 15 min   Charges:   PT Evaluation $PT Eval Low Complexity: 1 Procedure     PT G Codes:        Yariela Tison H. Owens Shark, PT, DPT, NCS 07/06/17, 9:45 AM [phone]

## 2017-07-06 NOTE — Progress Notes (Signed)
Initial Nutrition Assessment  DOCUMENTATION CODES:   Non-severe (moderate) malnutrition in context of chronic illness  INTERVENTION:  Discussed with patient how she has increased needs for calories and protein. Encouraged her to continue drinking her Boost Very High Protein. With how little PO she is taking in here at meals, she would need to drink three bottles of Boost VHP every day to regularly meet 94% minimum kcal and 97% minimum protein needs daily.  NUTRITION DIAGNOSIS:   Malnutrition (Moderate) related to chronic illness (esophageal cancer s/p chemoradiation) as evidenced by 14.7 percent weight loss over 6 months per pt report, mild depletion of body fat, moderate depletion of body fat, mild depletion of muscle mass, moderate depletions of muscle mass.  GOAL:   Patient will meet greater than or equal to 90% of their needs  MONITOR:   PO intake, Supplement acceptance, Labs, Weight trends, I & O's  REASON FOR ASSESSMENT:   Malnutrition Screening Tool    ASSESSMENT:   83 year old female with PMHx of adenocarcinoma of lower esophagus s/p chemoradiation, HTN, hypothyroidism, osteoporosis, SIADH who presents with abdominal pain, diarrhea found to have diverticulitis.   -Pt finished Carbo-taxol with XRT on 05/17/2017. Per notes patient has been reluctant to have PEG tube placed. -Plan is for PET scan tomorrow. Patient now with discharge order. -Patient is followed by outpatient RD at cancer center.  Spoke with patient at bedside. She reports her intake is variable. She reports that she does have difficulty swallowing, but reports she has more difficulty with the regurgitation. She reports she can only small amounts of food at a time. She does better with liquids. She drinks 1-3 bottles of Boost Very High Calorie per day (530 kcal, 22 grams of protein per bottle). Unable to report how often she drinks 3 per day and how often she drinks less. She reports she also has Boost Plus at  home to drink. She is unable to provide exact details on intake, so unable to determine her typical intake of calories and protein. Patient seemed to be downplaying how little her intake is of food to this RD because per note from NP at Oncology clinic yesterday patient had reported she was unable to eat or drink anything for a few days PTA due to nausea and abdominal pain. Patient reports before she was diagnosed with cancer [facility]. She would like to be able to start going back there.  Patient reports her UBW is 143 lbs. Noted in chart patient was 145.5 lbs on 09/02/2016. Patient has lost 21.4 lbs (14.7% body weight). Patient reports this weight loss has occurred over the past 6 months (since 12/2016), which would be significant for time frame.   Meal Completion: 20% of lunch today - fruit and cottage cheese plate (only 78 kcal and 3 grams of protein)  Medications reviewed and include: vitamin D 1000 units daily, Cipro, famotidine, levothyroxine, Flagyl, Ocuvite-Lutein one capsule daily, NS @ 75 ml/hr.  Labs reviewed: Sodium 132, Potassium 3.3, Chloride 100.  Nutrition-Focused physical exam completed. Findings are mild-moderate fat depletion (orbital region, upper arm region), mild-moderate muscle depletion (temple region, clavicle bone region, clavicle and acromion bone region, scapular bone region), and no edema.  Discussed with RN.  Diet Order:  Diet regular Room service appropriate? Yes; Fluid consistency: Thin  Skin:  Reviewed, no issues  Last BM:  07/05/2017  Height:   Ht Readings from Last 1 Encounters:  07/05/17  5' (1.524 m)    Weight:   Wt Readings from Last 1 Encounters:  07/05/17 124 lb 2 oz (56.3 kg)    Ideal Body Weight:  45.5 kg  BMI:  Body mass index is 24.24 kg/m.  Estimated Nutritional Needs:   Kcal:  1690-1970 (30-35 kcal/kg)  Protein:  68-85 grams (1.2-1.5 grams/kg)  Fluid:  1.4 L/day (25  ml/kg)  EDUCATION NEEDS:   No education needs identified at this time  Willey Blade, MS, RD, LDN Pager: [phone] After Hours Pager: [phone]

## 2017-07-06 NOTE — Care Management Obs Status (Signed)
[place] NOTIFICATION   Patient Details  Name: Brittney Jackson MRN: [id number] Date of Birth: 10-07-1930   Medicare Observation Status Notification Given:  Yes    Shelbie Ammons, RN 07/06/2017, 1:34 PM

## 2017-07-06 NOTE — Progress Notes (Signed)
Please note patient is currently receiving Home Palliative services. CMRN Hassan Rowan made aware. Thank you. Flo Shanks RN, BSN, [place] [facility] [place], [facility] [phone]

## 2017-07-06 NOTE — Progress Notes (Addendum)
ANTICOAGULATION CONSULT NOTE - Initial Consult  Pharmacy Consult for Lovenox dosing Indication: VTE prophylaxis  Allergies  Allergen Reactions  . Amlodipine Swelling    Patient Measurements: Height: 5' (152.4 cm) Weight: 124 lb 2 oz (56.3 kg) IBW/kg (Calculated) : 45.5 Heparin Dosing Weight: n/a  Vital Signs: Temp: 98 F (36.7 C) (07/09 2325) Temp Source: Oral (07/09 2325) BP: 153/71 (07/09 2325) Pulse Rate: 95 (07/09 2325)  Labs:  Recent Labs  07/03/17 1532 07/05/17 1325  HGB 12.3  --   HCT 35.1  --   PLT 358  --   LABPROT 12.7  --   INR 0.95  --   CREATININE 0.90 1.00    Estimated Creatinine Clearance: 32.3 mL/min (by C-G formula based on SCr of 1 mg/dL).   Medical History: Past Medical History:  Diagnosis Date  . Arthritis    left knee  . Cancer ([place])    esophageal cancer  . Function kidney decreased   . Hypertension   . Hypothyroidism   . Inappropriate ADH secretion (H[facility])   . Low sodium levels   . Osteoporosis   . Renal disorder     Medications:  Not on anticoagulation PTA per med rec.  Assessment:  Goal of Therapy:   Monitor platelets by anticoagulation protocol: Yes   Plan:  Lovenox 40 mg q 24 hours ordered. F/u labs per protocol.  Brittney Jackson 07/06/2017,1:46 AM

## 2017-07-06 NOTE — Care Management (Signed)
Admitted to this facility with the diagnosis of diverticulitis. Lives alone. Daughter is Jeani Hawking [phone]). Sees Dr. Derrel Nip every 6 months. Prescriptions are filled at [facility]. No home Health. No skilled facility. No home oxygen. Rolling walker, if needed. Takes care of all basic activities of daily living herself, drives.  Daughter, Jeani Hawking, states that [facility] is already in the home. Georgeanne Nim NP and Donzetta Sprung.  "We are coordinating everything needed." Physical therapy evaluation completed. No follow-up therapy  needs.  Shelbie Ammons RN MSN CCM Care Management [phone]

## 2017-07-06 NOTE — Discharge Instructions (Signed)
Chronic Diarrhea Diarrhea is a condition in which a person passes frequent loose and watery stools. It can cause you to feel weak and dehydrated. Dehydration can make you tired and thirsty. It can also cause a dry mouth, decreased urination, and dark yellow urine. Diarrhea is a sign of another underlying problem, such as:  Infection.  Medication side effects.  Dietary intolerance, such as lactose intolerance.  Conditions such as celiac disease, irritable bowel syndrome (IBS), or inflammatory bowel disease (IBD).  In most cases, diarrhea lasts 2-3 days. Diarrhea that lasts longer than 4 weeks is called long-lasting (chronic) diarrhea. It is important that you treat your diarrhea as told by your health care provider. Follow these instructions at home: Follow these recommendations as told by your health care provider. Eating and drinking  Take an oral rehydration solution (ORS). This is a drink that is designed to keep you hydrated. It can be found at pharmacies and retail stores.  Drink clear fluids, such as water, ice chips, diluted fruit juice, and low-calorie sports drinks.  Follow the diet recommended by your health care provider. You may need to avoid foods that trigger diarrhea for you.  Avoid foods and beverages that contain a lot of sugar or caffeine.  Avoid alcohol.  Avoid spicy or fatty foods. General instructions  Drink enough fluid to keep your urine clear or pale yellow.  Wash your hands often and after each diarrhea episode. If soap and water are not available, use hand sanitizer.  Make sure that all people in your household wash their hands well and often.  Take over-the-counter and prescription medicines only as told by your health care provider.  If you were prescribed an antibiotic medicine, take it as told by your health care provider. Do not stop taking the antibiotic even if you start to feel better.  Rest at home while you recover.  Watch your condition  for any changes.  Take a warm bath to relieve any burning or pain from frequent diarrhea episodes.  Keep all follow-up visits as told by your health care provider. This is important. Contact a health care provider if:  You have a fever.  Your diarrhea gets worse or does not get better.  You have new symptoms.  You cannot drink fluids without vomiting.  You feel light-headed or dizzy.  You have a headache.  You have muscle cramps.  You have severe pain in the rectum. Get help right away if:  You have persistent vomiting.  You have chest pain.  You feel extremely weak or you faint.  You have bloody or black stools, or stools that look like tar.  You have severe pain, cramping, or bloating in your abdomen, or pain that stays in one place.  You have trouble breathing or you are breathing very quickly.  Your heart is beating very quickly.  Your skin feels cold and clammy.  You feel confused.  You have a severe headache.  You have signs of dehydration, such as: ? Dark urine, very little urine, or no urine. ? Cracked lips. ? Dry mouth. ? Sunken eyes. ? Sleepiness. ? Weakness. Summary  Chronic diarrhea is a condition in which a person passes frequent loose and watery stools for more than 4 weeks.  Diarrhea is a sign of another underlying problem.  Drink enough fluid to keep your urine clear or pale yellow to avoid dehydration.  Wash your hands often and after each diarrhea episode. If soap and water are not available, use  hand sanitizer.  It is important that you treat your diarrhea as told by your health care provider. This information is not intended to replace advice given to you by your health care provider. Make sure you discuss any questions you have with your health care provider. Document Released: 03/05/2004 Document Revised: 11/02/2016 Document Reviewed: 11/02/2016 Elsevier Interactive Patient Education  2017 [employer]   Diverticulitis Diverticulitis is inflammation or infection of small pouches in your colon that form when you have a condition called diverticulosis. The pouches in your colon are called diverticula. Your colon, or large intestine, is where water is absorbed and stool is formed. Complications of diverticulitis can include:  Bleeding.  Severe infection.  Severe pain.  Perforation of your colon.  Obstruction of your colon.  What are the causes? Diverticulitis is caused by bacteria. Diverticulitis happens when stool becomes trapped in diverticula. This allows bacteria to grow in the diverticula, which can lead to inflammation and infection. What increases the risk? People with diverticulosis are at risk for diverticulitis. Eating a diet that does not include enough fiber from fruits and vegetables may make diverticulitis more likely to develop. What are the signs or symptoms? Symptoms of diverticulitis may include:  Abdominal pain and tenderness. The pain is normally located on the left side of the abdomen, but may occur in other areas.  Fever and chills.  Bloating.  Cramping.  Nausea.  Vomiting.  Constipation.  Diarrhea.  Blood in your stool.  How is this diagnosed? Your health care provider will ask you about your medical history and do a physical exam. You may need to have tests done because many medical conditions can cause the same symptoms as diverticulitis. Tests may include:  Blood tests.  Urine tests.  Imaging tests of the abdomen, including X-rays and CT scans.  When your condition is under control, your health care provider may recommend that you have a colonoscopy. A colonoscopy can show how severe your diverticula are and whether something else is causing your symptoms. How is this treated? Most cases of diverticulitis are mild and can be treated at home. Treatment may include:  Taking over-the-counter pain medicines.  Following a clear liquid  diet.  Taking antibiotic medicines by mouth for 7-10 days.  More severe cases may be treated at a hospital. Treatment may include:  Not eating or drinking.  Taking prescription pain medicine.  Receiving antibiotic medicines through an IV tube.  Receiving fluids and nutrition through an IV tube.  Surgery.  Follow these instructions at home:  Follow your health care providers instructions carefully.  Follow a full liquid diet or other diet as directed by your health care provider. After your symptoms improve, your health care provider may tell you to change your diet. He or she may recommend you eat a high-fiber diet. Fruits and vegetables are good sources of fiber. Fiber makes it easier to pass stool.  Take fiber supplements or probiotics as directed by your health care provider.  Only take medicines as directed by your health care provider.  Keep all your follow-up appointments. Contact a health care provider if:  Your pain does not improve.  You have a hard time eating food.  Your bowel movements do not return to normal. Get help right away if:  Your pain becomes worse.  Your symptoms do not get better.  Your symptoms suddenly get worse.  You have a fever.  You have repeated vomiting.  You have bloody or black, tarry stools. This  information is not intended to replace advice given to you by your health care provider. Make sure you discuss any questions you have with your health care provider. Document Released: 09/23/2005 Document Revised: 05/21/2016 Document Reviewed: 11/08/2013 Elsevier Interactive Patient Education  2017 [employer].

## 2017-07-07 ENCOUNTER — Ambulatory Visit
Admission: RE | Admit: 2017-07-07 | Discharge: 2017-07-07 | Disposition: A | Discharge status: Home / Self Care | Payer: Medicare Other | Source: Ambulatory Visit | Attending: Internal Medicine | Admitting: Internal Medicine

## 2017-07-07 DIAGNOSIS — I700 Atherosclerosis of aorta: Secondary | ICD-10-CM | POA: Insufficient documentation

## 2017-07-07 DIAGNOSIS — K5732 Diverticulitis of large intestine without perforation or abscess without bleeding: Secondary | ICD-10-CM | POA: Insufficient documentation

## 2017-07-07 DIAGNOSIS — I2510 Atherosclerotic heart disease of native coronary artery without angina pectoris: Secondary | ICD-10-CM | POA: Insufficient documentation

## 2017-07-07 DIAGNOSIS — N2889 Other specified disorders of kidney and ureter: Secondary | ICD-10-CM | POA: Diagnosis not present

## 2017-07-07 DIAGNOSIS — E860 Dehydration: Secondary | ICD-10-CM | POA: Diagnosis not present

## 2017-07-07 DIAGNOSIS — C159 Malignant neoplasm of esophagus, unspecified: Secondary | ICD-10-CM | POA: Diagnosis not present

## 2017-07-07 DIAGNOSIS — C155 Malignant neoplasm of lower third of esophagus: Secondary | ICD-10-CM

## 2017-07-07 LAB — GLUCOSE, CAPILLARY: GLUCOSE-CAPILLARY: 100 mg/dL — AB (ref 65–99)

## 2017-07-07 MED ORDER — FLUDEOXYGLUCOSE F - 18 (FDG) INJECTION
12.0000 | Freq: Once | INTRAVENOUS | Status: AC | PRN
  Administered 2017-07-07: 12.59 via INTRAVENOUS

## 2017-07-08 LAB — URINE CULTURE: Culture: >=100000 — AB

## 2017-07-08 NOTE — Discharge Summary (Signed)
Brittney Jackson, is a 83 y.o. female  DOB 22-Nov-1930  MRN [id number].  Admission date:  07/05/2017  Admitting Physician  Harvie Bridge, DO  Discharge Date:  07/06/2017   Primary MD  Crecencio Mc, MD  Recommendations for primary care physician for things to follow:   PCP in 1 week Follow up for a PET scan tomorrow.   Admission Diagnosis  Colitis [K52.9] Diverticulitis [K57.92] Hyponatremia [E87.1] Acute cystitis without hematuria [N30.00]   Discharge Diagnosis  Colitis [K52.9] Diverticulitis [K57.92] Hyponatremia [E87.1] Acute cystitis without hematuria [N30.00]   Active Problems:   Diverticulitis      Past Medical History:  Diagnosis Date  . Arthritis    left knee  . Cancer ([facility])    esophageal cancer  . Function kidney decreased   . Hypertension   . Hypothyroidism   . Inappropriate ADH secretion ([facility])   . Low sodium levels   . Osteoporosis   . Renal disorder     Past Surgical History:  Procedure Laterality Date  . CATARACT EXTRACTION W/PHACO Left 05/29/2015   Procedure: CATARACT EXTRACTION PHACO AND INTRAOCULAR LENS PLACEMENT (IOC);  Surgeon: Leandrew Koyanagi, MD;  Location: [facility];  Service: Ophthalmology;  Laterality: Left;  . CATARACT EXTRACTION W/PHACO Right 01/15/2016   Procedure: CATARACT EXTRACTION PHACO AND INTRAOCULAR LENS PLACEMENT (IOC);  Surgeon: Leandrew Koyanagi, MD;  Location: [facility];  Service: Ophthalmology;  Laterality: Right;  SHUGARCAINE  . ESOPHAGOGASTRODUODENOSCOPY (EGD) WITH PROPOFOL N/A 03/16/2017   Procedure: ESOPHAGOGASTRODUODENOSCOPY (EGD) WITH PROPOFOL;  Surgeon: [name], MD;  Location: [facility] ENDOSCOPY;  Service: Endoscopy;  Laterality: N/A;  . ESOPHAGOGASTRODUODENOSCOPY (EGD) WITH PROPOFOL N/A 06/25/2017   Procedure:  ESOPHAGOGASTRODUODENOSCOPY (EGD) WITH PROPOFOL;  Surgeon: Jonathon Bellows, MD;  Location: [facility] ENDOSCOPY;  Service: Endoscopy;  Laterality: N/A;  . TONSILLECTOMY         History of present illness and  Hospital Course:     Kindly see H&P for history of present illness and admission details, please review complete Labs, Consult reports and Test reports for all details in brief  HPI  from the history and physical done on the day of admission 61-year-old female patient came in because of abdominal pain, diarrhea, possible dehydration. Patient has a history of esophageal cancer with possible malignant stricture and having trouble swallowing and in the process of getting a PET scan for evaluation of dysphagia and further treatment options came in because of abdominal pain, diarrhea and dehydration and admitted for the same, patient finished radiation therapy and chemotherapy for esophageal cancer. . Found to have  sigmoid colon diverticulitis/colitis on the CAT scan of abdomen.  Hospital Course   abdominal pain with nausea, diarrhea due to diverticulitis admitted to hospitalist service, started on Cipro, Flagyl IV, also given IV fluids for dehydration, and wanted to go home because she is scheduled to have a PET scan on July 11. When she wanted to have the PET scan and she's been waiting for the PET scan for long time. So discharge home with Cipro, Flagyl, patient is given suspension of Cipro, Flagyl and liquid form for her to make it easier to swallow. Given prescription for 7 days. Abdominal pain, diarrhea resolved this time. Patient does have chronic dysphagia due to her cancer and has been getting evaluated with PET scan Today and she is really wanted to go home and daughter also requested that she be discharged so that she can go for PET scan on July 11 as scheduled. #2 UTI:  Showed Enterobacter sensitive to Cipro. #3. A GI chronic, patient received radiation, chemotherapy, going for PET scan to  evaluate for her dysphagia. #4 chronic hyponatremia and SIADH.; 122, potassium 3.3 this admission. #5 esophageal cancer with radiation, now has difficulty swallowing and follows up with Dr. Rogue Bussing , PET scan is due tomorrow, follow up with cancer center as scheduled.   discharge Condition:stable   Follow UP  Follow-up Information    Crecencio Mc, MD Follow up in 1 week(s).   Specialty:  Internal Medicine Contact information: [address] [place] [address] [phone]             Discharge Instructions  and  Discharge Medications     Allergies as of 07/06/2017      Reactions   Amlodipine Swelling      Medication List    STOP taking these medications   ondansetron 4 MG disintegrating tablet Commonly known as:  ZOFRAN ODT     TAKE these medications   ciprofloxacin 500 MG/5ML (10%) suspension Commonly known as:  CIPRO Take 5 mLs (500 mg total) by mouth 2 (two) times daily.   dexamethasone 4 MG tablet Commonly known as:  DECADRON Take 1 tablet (4 mg total) by mouth 2 (two) times daily.   HYDROcodone-acetaminophen 5-325 MG tablet Commonly known as:  NORCO Take 1 tablet by mouth every 6 (six) hours as needed for moderate pain.   labetalol 300 MG tablet Commonly known as:  NORMODYNE Take 0.5 tablets (150 mg total) by mouth 3 (three) times daily. 1/2 tab (150 mg) Am and PM   levothyroxine 88 MCG tablet Commonly known as:  SYNTHROID, LEVOTHROID TAKE ONE (1) TABLET EACH DAY   losartan 100 MG tablet Commonly known as:  COZAAR TAKE ONE (1) TABLET BY MOUTH EVERY DAY   metroNIDAZOLE 50 mg/ml oral suspension Commonly known as:  FLAGYL Take 10 mLs (500 mg total) by mouth 3 (three) times daily.   ranitidine 75 MG tablet Commonly known as:  ZANTAC Take 75 mg by mouth 2 (two) times daily.   simvastatin 40 MG tablet Commonly known as:  ZOCOR TAKE ONE (1) TABLET AT BEDTIME   sucralfate 1 g tablet Commonly known as:  CARAFATE Take 1  tablet (1 g total) by mouth 4 (four) times daily -  with meals and at bedtime. Dissolve in warm water, swish and swallow         Diet and Activity recommendation: See Discharge Instructions above   Consults obtained - hospice    Major procedures and Radiology Reports - PLEASE review detailed and final reports for all details, in brief -     Ct Abdomen Pelvis W Contrast  Result Date: 07/05/2017 CLINICAL DATA:  Lower abdominal pain, couple days of diarrhea, history of esophageal cancer post radiation therapy and chemotherapy that ended in May, additional history of hypertension, former smoker EXAM: CT ABDOMEN AND PELVIS WITH CONTRAST TECHNIQUE: Multidetector CT imaging of the abdomen and pelvis was performed using the standard protocol following bolus administration of intravenous contrast. Sagittal and coronal MPR images reconstructed from axial data set. CONTRAST:  145mL ISOVUE-300 IOPAMIDOL (ISOVUE-300) INJECTION 61% IV. No oral contrast. COMPARISON:  CT abdomen and pelvis 03/14/2017; PET-CT 03/24/2017 FINDINGS: Lower chest: 8 x 5 mm irregular nodule LEFT lower lobe image 10, previously 9 x 7 mm. Linear subsegmental atelectasis RIGHT lower lobe. Hepatobiliary: Probable mild fatty infiltration of liver adjacent to falciform fissure. Gallbladder and liver otherwise normal appearance Pancreas: Normal appearance Spleen:  Normal appearance Adrenals/Urinary Tract: Minimal thickening of adrenal glands without discrete mass. Small cyst upper pole LEFT kidney image 26. Enhancing mass within the posterior LEFT kidney, 21 x 20 mm cyst with washout on delayed images question renal neoplasm, grossly unchanged. No additional year tarry mass. No hydronephrosis or hydroureter. Bladder unremarkable. Stomach/Bowel: Wall thickening of distal esophagus likely related to patient's known tumor. Stomach decompressed. Small bowel loops unremarkable. Appendix not localized but no definite evidence of a pericecal  inflammatory process is seen. Wall thickening of sigmoid colon with mild hazy infiltration of pericolic fat, few sigmoid diverticular noted, question colitis or diverticulitis. No evidence of perforation or abscess. Remainder of colon unremarkable. Vascular/Lymphatic: Scattered pelvic phleboliths. Atherosclerotic calcifications aorta and iliac arteries as well as within celiac axis and SMA. No aortic aneurysm. Few normal sized retroperitoneal nodes. No definite adenopathy. Reproductive: Atrophic uterus and ovaries. Other: No free air or free fluid.  No hernia. Musculoskeletal: Diffuse osseous demineralization. No focal bone lesions. IMPRESSION: Wall thickening of the sigmoid colon with hazy infiltration of pericolic fat and note of scattered sigmoid diverticula, question diverticulitis versus colitis. Wall thickening of the distal esophagus likely related to patient's primary tumor. Slight decrease in size of a LEFT lower lobe pulmonary nodule. Enhancing mass LEFT kidney 21 x 20 mm question neoplasm not significantly changed. Aortic Atherosclerosis (ICD10-I70.0). Electronically Signed   By: Lavonia Dana M.D.   On: 07/05/2017 18:23   Nm Pet Image Restag (ps) Skull Base To Thigh  Result Date: 07/07/2017 CLINICAL DATA:  Subsequent treatment strategy for esophageal cancer. EXAM: NUCLEAR MEDICINE PET SKULL BASE TO THIGH TECHNIQUE: 12.6 mCi F-18 FDG was injected intravenously. Full-ring PET imaging was performed from the skull base to thigh after the radiotracer. CT data was obtained and used for attenuation correction and anatomic localization. FASTING BLOOD GLUCOSE:  Value: 100 mg/dl COMPARISON:  Multiple exams, including 03/24/2017 FINDINGS: NECK No hypermetabolic lymph nodes in the neck. Bilateral carotid atherosclerotic calcification. CHEST The distal esophageal mass has a maximum standard uptake value of 5.8 (formerly 8.3). Slight indistinctness of adjacent tissue planes or small adjacent lymph nodes, not  appreciably changed. Coronary, aortic arch, and branch vessel atherosclerotic vascular disease. Mild biapical pleuroparenchymal scarring. Old granulomatous disease. Calcified granuloma in the right middle lobe. Mild scarring in the right lower lobe. Mild scarring in the lingula. Curvilinear left lower lobe nodule, 0.8 by 0.5 cm, formerly 0.8 by 0.9 cm, no current appreciable metabolic activity. ABDOMEN/PELVIS No abnormal hypermetabolic activity within the liver, pancreas, adrenal glands, or spleen. No hypermetabolic lymph nodes in the abdomen or pelvis. The patient has a known enhancing mass in the left mid kidney which based on diagnostic postcontrast CT images would be considered compatible with renal cell carcinoma. The lesion has metabolic activity similar to the rest of the kidney and is accordingly very inconspicuous on PET-CT. Physiologic activity in bowel. Vicarious excretion of contrast from recent CT scan into the gallbladder. Aortoiliac atherosclerotic vascular disease. Sigmoid colon diverticulosis. Mild wall thickening along the sigmoid colon could reflect diverticulitis, and there is new accentuated activity in the vicinity of this wall thickening with maximum SUV of 2 9.0. This region of the bowel was not hypermetabolic on prior PET-CT from 03/24/2017 and accordingly inflammatory etiology is favored over neoplastic. SKELETON No focal hypermetabolic activity to suggest skeletal metastasis. IMPRESSION: 1. Reduced activity in the distal esophageal mass, previous 8.3, current maximum SUV 5.8. Slightly indistinct margins with stranding or tiny adjacent paraesophageal lymph nodes, morphology similar to prior. 2.  The patient has a known enhancing mass in the left mid kidney, very indistinct on PET-CT given that the metabolic activity of this lesion is similar to that of the surrounding kidney. Probably a renal cell carcinoma. 3. Sigmoid diverticulitis, with some new accentuated metabolic activity in the  vicinity of this inflammation. 4. The curving left lower lobe nodule is reduced in size compared to the prior exam. Surveillance likely warranted. 5.  Aortic Atherosclerosis (ICD10-I70.0).  Coronary atherosclerosis. Electronically Signed   By: Van Clines M.D.   On: 07/07/2017 13:28   Dg Chest Port 1 View  Result Date: 07/03/2017 CLINICAL DATA:  Cough and shortness of breath.  Esophageal carcinoma EXAM: PORTABLE CHEST 1 VIEW COMPARISON:  PET-CT March 24, 2017; chest radiograph December 31, 2013 FINDINGS: There is no edema or consolidation. Heart size is upper normal with pulmonary vascularity within normal limits. No adenopathy evident. There is aortic atherosclerosis. There is calcification in each carotid artery. There is no obvious esophageal thickening by radiography. No bone lesions. IMPRESSION: No edema or consolidation. No evident adenopathy. Aortic atherosclerosis. There is carotid artery calcification bilaterally. Aortic Atherosclerosis (ICD10-I70.0). Electronically Signed   By: Lowella Grip III M.D.   On: 07/03/2017 16:21    Micro Results    Recent Results (from the past 240 hour(s))  Urine culture     Status: Abnormal   Collection Time: 07/05/17  2:49 PM  Result Value Ref Range Status   Specimen Description URINE, CLEAN CATCH  Final   Special Requests NONE  Final   Culture >=100,000 COLONIES/mL ENTEROBACTER AEROGENES (A)  Final   Report Status 07/08/2017 FINAL  Final   Organism ID, Bacteria ENTEROBACTER AEROGENES (A)  Final      Susceptibility   Enterobacter aerogenes - MIC*    CEFAZOLIN RESISTANT Resistant     CEFTRIAXONE <=1 SENSITIVE Sensitive     CIPROFLOXACIN <=0.25 SENSITIVE Sensitive     GENTAMICIN <=1 SENSITIVE Sensitive     IMIPENEM 1 SENSITIVE Sensitive     NITROFURANTOIN 64 INTERMEDIATE Intermediate     TRIMETH/SULFA <=20 SENSITIVE Sensitive     PIP/TAZO <=4 SENSITIVE Sensitive     * >=100,000 COLONIES/mL ENTEROBACTER AEROGENES  C difficile quick scan w  PCR reflex     Status: None   Collection Time: 07/06/17  9:50 AM  Result Value Ref Range Status   C Diff antigen NEGATIVE NEGATIVE Final   C Diff toxin NEGATIVE NEGATIVE Final   C Diff interpretation No C. difficile detected.  Final  Gastrointestinal Panel by PCR , Stool     Status: None   Collection Time: 07/06/17  9:50 AM  Result Value Ref Range Status   Campylobacter species NOT DETECTED NOT DETECTED Final   Plesimonas shigelloides NOT DETECTED NOT DETECTED Final   Salmonella species NOT DETECTED NOT DETECTED Final   Yersinia enterocolitica NOT DETECTED NOT DETECTED Final   Vibrio species NOT DETECTED NOT DETECTED Final   Vibrio cholerae NOT DETECTED NOT DETECTED Final   Enteroaggregative E coli (EAEC) NOT DETECTED NOT DETECTED Final   Enteropathogenic E coli (EPEC) NOT DETECTED NOT DETECTED Final   Enterotoxigenic E coli (ETEC) NOT DETECTED NOT DETECTED Final   Shiga like toxin producing E coli (STEC) NOT DETECTED NOT DETECTED Final   Shigella/Enteroinvasive E coli (EIEC) NOT DETECTED NOT DETECTED Final   Cryptosporidium NOT DETECTED NOT DETECTED Final   Cyclospora cayetanensis NOT DETECTED NOT DETECTED Final   Entamoeba histolytica NOT DETECTED NOT DETECTED Final   Giardia lamblia NOT  DETECTED NOT DETECTED Final   Adenovirus F40/41 NOT DETECTED NOT DETECTED Final   Astrovirus NOT DETECTED NOT DETECTED Final   Norovirus GI/GII NOT DETECTED NOT DETECTED Final   Rotavirus A NOT DETECTED NOT DETECTED Final   Sapovirus (I, II, IV, and V) NOT DETECTED NOT DETECTED Final       Today   Subjective:   Brittney Jackson today has no headache,no chest abdominal pain,no new weakness tingling or numbness, feels much better wants to go home today.   Objective:   Blood pressure (!) 104/44, pulse 74, temperature 97.6 F (36.4 C), temperature source Oral, resp. rate 18, height 5' (1.524 m), weight 56.3 kg (124 lb 2 oz), SpO2 97 %.  No intake or output data in the 24 hours ending 07/08/17  1517  Exam Awake Alert, Oriented x 3, No new F.N deficits, Normal affect [place].AT,PERRAL Supple Neck,No JVD, No cervical lymphadenopathy appriciated.  Symmetrical Chest wall movement, Good air movement bilaterally, CTAB RRR,No Gallops,Rubs or new Murmurs, No Parasternal Heave +ve B.Sounds, Abd Soft, Non tender, No organomegaly appriciated, No rebound -guarding or rigidity. No Cyanosis, Clubbing or edema, No new Rash or bruise  Data Review   CBC w Diff:  Lab Results  Component Value Date   WBC 15.4 (H) 07/06/2017   HGB 10.9 (L) 07/06/2017   HGB 11.1 (L) 01/03/2014   HCT 31.3 (L) 07/06/2017   HCT 32.6 (L) 01/03/2014   PLT 285 07/06/2017   PLT 376 01/03/2014   LYMPHOPCT 16 07/03/2017   LYMPHOPCT 21.7 01/03/2014   BANDSPCT 2 06/11/2017   MONOPCT 16 07/03/2017   MONOPCT 17.3 01/03/2014   EOSPCT 1 07/03/2017   EOSPCT 2.5 01/03/2014   BASOPCT 1 07/03/2017   BASOPCT 0.8 01/03/2014    CMP:  Lab Results  Component Value Date   NA 132 (L) 07/06/2017   NA 139 02/26/2017   NA 130 (L) 01/05/2014   K 3.3 (L) 07/06/2017   K 4.3 01/05/2014   CL 100 (L) 07/06/2017   CL 97 (L) 01/05/2014   CO2 27 07/06/2017   CO2 28 01/05/2014   BUN 20 07/06/2017   BUN 17 02/26/2017   BUN 20 (H) 01/05/2014   CREATININE 0.69 07/06/2017   CREATININE 0.82 01/05/2014   GLU 107 02/26/2017   PROT 7.1 07/05/2017   PROT 7.4 12/27/2013   ALBUMIN 3.3 (L) 07/05/2017   ALBUMIN 3.6 12/27/2013   BILITOT 0.6 07/05/2017   BILITOT 0.4 12/27/2013   ALKPHOS 110 07/05/2017   ALKPHOS 134 (H) 12/27/2013   AST 25 07/05/2017   AST 35 12/27/2013   ALT 20 07/05/2017   ALT 28 12/27/2013  .   Total Time in preparing paper work, data evaluation and todays exam - 41 minutes  Jenelle Drennon M.D on 7/102018 at 3:17 PM    Note: This dictation was prepared with Dragon dictation along with smaller phrase technology. Any transcriptional errors that result from this process are unintentional.

## 2017-07-09 ENCOUNTER — Inpatient Hospital Stay: Payer: Medicare Other

## 2017-07-09 ENCOUNTER — Inpatient Hospital Stay (HOSPITAL_BASED_OUTPATIENT_CLINIC_OR_DEPARTMENT_OTHER): Payer: Medicare Other | Admitting: Internal Medicine

## 2017-07-09 VITALS — BP 158/91 | HR 95 | Temp 97.7°F | Resp 18 | Wt 123.0 lb

## 2017-07-09 DIAGNOSIS — E871 Hypo-osmolality and hyponatremia: Secondary | ICD-10-CM

## 2017-07-09 DIAGNOSIS — Z79899 Other long term (current) drug therapy: Secondary | ICD-10-CM

## 2017-07-09 DIAGNOSIS — K5792 Diverticulitis of intestine, part unspecified, without perforation or abscess without bleeding: Secondary | ICD-10-CM

## 2017-07-09 DIAGNOSIS — R918 Other nonspecific abnormal finding of lung field: Secondary | ICD-10-CM

## 2017-07-09 DIAGNOSIS — Z87891 Personal history of nicotine dependence: Secondary | ICD-10-CM

## 2017-07-09 DIAGNOSIS — R109 Unspecified abdominal pain: Secondary | ICD-10-CM | POA: Diagnosis not present

## 2017-07-09 DIAGNOSIS — R5383 Other fatigue: Secondary | ICD-10-CM

## 2017-07-09 DIAGNOSIS — R531 Weakness: Secondary | ICD-10-CM

## 2017-07-09 DIAGNOSIS — E222 Syndrome of inappropriate secretion of antidiuretic hormone: Secondary | ICD-10-CM

## 2017-07-09 DIAGNOSIS — M129 Arthropathy, unspecified: Secondary | ICD-10-CM

## 2017-07-09 DIAGNOSIS — M818 Other osteoporosis without current pathological fracture: Secondary | ICD-10-CM

## 2017-07-09 DIAGNOSIS — C155 Malignant neoplasm of lower third of esophagus: Secondary | ICD-10-CM

## 2017-07-09 DIAGNOSIS — R1310 Dysphagia, unspecified: Secondary | ICD-10-CM

## 2017-07-09 DIAGNOSIS — I10 Essential (primary) hypertension: Secondary | ICD-10-CM

## 2017-07-09 DIAGNOSIS — E876 Hypokalemia: Secondary | ICD-10-CM

## 2017-07-09 DIAGNOSIS — E860 Dehydration: Secondary | ICD-10-CM

## 2017-07-09 DIAGNOSIS — M549 Dorsalgia, unspecified: Secondary | ICD-10-CM | POA: Diagnosis not present

## 2017-07-09 DIAGNOSIS — M25472 Effusion, left ankle: Secondary | ICD-10-CM | POA: Diagnosis not present

## 2017-07-09 DIAGNOSIS — R634 Abnormal weight loss: Secondary | ICD-10-CM

## 2017-07-09 DIAGNOSIS — Z8051 Family history of malignant neoplasm of kidney: Secondary | ICD-10-CM

## 2017-07-09 DIAGNOSIS — R197 Diarrhea, unspecified: Secondary | ICD-10-CM

## 2017-07-09 DIAGNOSIS — Z923 Personal history of irradiation: Secondary | ICD-10-CM

## 2017-07-09 DIAGNOSIS — R911 Solitary pulmonary nodule: Secondary | ICD-10-CM | POA: Diagnosis not present

## 2017-07-09 DIAGNOSIS — Z9221 Personal history of antineoplastic chemotherapy: Secondary | ICD-10-CM

## 2017-07-09 DIAGNOSIS — E039 Hypothyroidism, unspecified: Secondary | ICD-10-CM

## 2017-07-09 DIAGNOSIS — M25471 Effusion, right ankle: Secondary | ICD-10-CM | POA: Diagnosis not present

## 2017-07-09 DIAGNOSIS — N2889 Other specified disorders of kidney and ureter: Secondary | ICD-10-CM

## 2017-07-09 LAB — CBC WITH DIFFERENTIAL/PLATELET
BASOS PCT: 1 %
Basophils Absolute: 0 10*3/uL (ref 0–0.1)
EOS ABS: 0.1 10*3/uL (ref 0–0.7)
Eosinophils Relative: 1 %
HEMATOCRIT: 31.9 % — AB (ref 35.0–47.0)
HEMOGLOBIN: 11.1 g/dL — AB (ref 12.0–16.0)
LYMPHS ABS: 0.8 10*3/uL — AB (ref 1.0–3.6)
Lymphocytes Relative: 10 %
MCH: 30.9 pg (ref 26.0–34.0)
MCHC: 34.8 g/dL (ref 32.0–36.0)
MCV: 88.8 fL (ref 80.0–100.0)
MONOS PCT: 15 %
Monocytes Absolute: 1.1 10*3/uL — ABNORMAL HIGH (ref 0.2–0.9)
NEUTROS ABS: 5.7 10*3/uL (ref 1.4–6.5)
NEUTROS PCT: 73 %
Platelets: 325 10*3/uL (ref 150–440)
RBC: 3.59 MIL/uL — AB (ref 3.80–5.20)
RDW: 16.3 % — ABNORMAL HIGH (ref 11.5–14.5)
WBC: 7.6 10*3/uL (ref 3.6–11.0)

## 2017-07-09 LAB — BASIC METABOLIC PANEL
Anion gap: 5 (ref 5–15)
BUN: 18 mg/dL (ref 6–20)
CHLORIDE: 102 mmol/L (ref 101–111)
CO2: 27 mmol/L (ref 22–32)
Calcium: 8.7 mg/dL — ABNORMAL LOW (ref 8.9–10.3)
Creatinine, Ser: 0.86 mg/dL (ref 0.44–1.00)
GFR calc non Af Amer: 60 mL/min — ABNORMAL LOW (ref >60)
Glucose, Bld: 110 mg/dL — ABNORMAL HIGH (ref 65–99)
POTASSIUM: 2.8 mmol/L — AB (ref 3.5–5.1)
SODIUM: 134 mmol/L — AB (ref 135–145)

## 2017-07-09 MED ORDER — SODIUM CHLORIDE 0.9 % IV SOLN
INTRAVENOUS | Status: DC
  Administered 2017-07-09: 12:00:00 via INTRAVENOUS
  Filled 2017-07-09: qty 1000

## 2017-07-09 MED ORDER — SODIUM CHLORIDE 0.9 % IV SOLN
Freq: Once | INTRAVENOUS | Status: AC
  Administered 2017-07-09: 12:00:00 via INTRAVENOUS
  Filled 2017-07-09: qty 20

## 2017-07-09 MED ORDER — POTASSIUM CHLORIDE CRYS ER 20 MEQ PO TBCR: 20.0000 meq | EXTENDED_RELEASE_TABLET | Freq: Two times a day (BID) | ORAL | 3 refills | Status: AC

## 2017-07-09 NOTE — Progress Notes (Signed)
Brittney Jackson CONSULT NOTE  Patient Care Team: Crecencio Mc, MD as PCP - General (Internal Medicine) Clent Jacks, RN as Registered Nurse  CHIEF COMPLAINTS/PURPOSE OF CONSULTATION: Esophageal cancer  #  Oncology History   # MARCH 2018- ADENO CA; mod diff [lower third of esophagus; EGD Bx; Dr.Wohl]; CT- A/P- no distant mets/LN. PET- TxN1; no EUS.  # April 5th 2018- Carbo-taxol with RT [until May 22nd 2018]; July 12h PET-improved.    # Dysphagia- s/p EGD [Dr. Vicente Males; July 2018]  # March 2018- LEFT KIDNEY COMPLEX MASS ~2cm [incidental]; ~ 2 cm left lower lobe nodule [question second primary]  # Hx of Hyponatremia [~[phone]; Dr.Lateef]  # MOLECULAR TESTING- Her 2 neu-NEGATIVE; PDL-1- POSITIVE [CPS-2]; MMR- STABLE.      Malignant neoplasm of lower third of esophagus (HCC)     HISTORY OF PRESENTING ILLNESS:  Brittney Jackson 83 y.o.  female diagnosed adenocarcinoma the Lower esophagus currently s/p  chemoradiation is here for follow-up [finished RT on may 21st] Is here for follow-up To review the results of her restaging PET scan.  In the interim patient was admitted to the hospital for abdominal pain CT scan shows diverticulitis. She is on ciprofloxacin and Flagyl. Symptoms are improving.  Her back pain is resolved.  Patient continues to complain of difficulty swallowing with regurgitation. She is having difficulty swallowing potassium pills. However she is gaining some weight. Patient admitted fluids almost every week in the last few weeks.  Denies any tingling or numbness.  No chest pain or shortness of breath or cough.   ROS: A complete 10 point review of system is done which is negative except mentioned above in history of present illness  MEDICAL HISTORY:  Past Medical History:  Diagnosis Date  . Arthritis    left knee  . Cancer ([place])    esophageal cancer  . Function kidney decreased   . Hypertension   . Hypothyroidism   . Inappropriate ADH  secretion (HCC)   . Low sodium levels   . Osteoporosis   . Renal disorder     SURGICAL HISTORY: Past Surgical History:  Procedure Laterality Date  . CATARACT EXTRACTION W/PHACO Left 05/29/2015   Procedure: CATARACT EXTRACTION PHACO AND INTRAOCULAR LENS PLACEMENT (IOC);  Surgeon: Leandrew Koyanagi, MD;  Location: [facility];  Service: Ophthalmology;  Laterality: Left;  . CATARACT EXTRACTION W/PHACO Right 01/15/2016   Procedure: CATARACT EXTRACTION PHACO AND INTRAOCULAR LENS PLACEMENT (IOC);  Surgeon: Leandrew Koyanagi, MD;  Location: [facility];  Service: Ophthalmology;  Laterality: Right;  SHUGARCAINE  . ESOPHAGOGASTRODUODENOSCOPY (EGD) WITH PROPOFOL N/A 03/16/2017   Procedure: ESOPHAGOGASTRODUODENOSCOPY (EGD) WITH PROPOFOL;  Surgeon: [name], MD;  Location: [facility] ENDOSCOPY;  Service: Endoscopy;  Laterality: N/A;  . ESOPHAGOGASTRODUODENOSCOPY (EGD) WITH PROPOFOL N/A 06/25/2017   Procedure: ESOPHAGOGASTRODUODENOSCOPY (EGD) WITH PROPOFOL;  Surgeon: Jonathon Bellows, MD;  Location: [facility] ENDOSCOPY;  Service: Endoscopy;  Laterality: N/A;  . 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 Family is in the area.  Social History   Social History  . Marital status: Widowed    Spouse name: N/A  . Number of children: N/A  . Years of education: N/A   Occupational History  . 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 Smoking status: Former Smoker    Types: Cigarettes    Quit date: 07/21/2000  . Smokeless tobacco: Never Used  .  Alcohol use No  . Drug use: No  . Sexual activity: No   Other Topics Concern  . Not on file   Social History Narrative   Church activities, community service.   Lives alone.    FAMILY HISTORY: Family History  Problem Relation Age of Onset  . Heart attack Mother   . Cancer Maternal Uncle        renal cancer   . COPD Neg Hx        no breast,  ovarian or colon    ALLERGIES:  is allergic to amlodipine.  MEDICATIONS:  Current Outpatient Prescriptions  Medication Sig Dispense Refill  . ciprofloxacin (CIPRO) 500 MG/5ML (10%) suspension Take 5 mLs (500 mg total) by mouth 2 (two) times daily. 100 mL 0  . HYDROcodone-acetaminophen (NORCO) 5-325 MG tablet Take 1 tablet by mouth every 6 (six) hours as needed for moderate pain. 60 tablet 0  . levothyroxine (SYNTHROID, LEVOTHROID) 88 MCG tablet TAKE ONE (1) TABLET EACH DAY 90 tablet 1  . metroNIDAZOLE (FLAGYL) 50 mg/ml oral suspension Take 10 mLs (500 mg total) by mouth 3 (three) times daily. 300 mL 0  . sucralfate (CARAFATE) 1 g tablet Take 1 tablet (1 g total) by mouth 4 (four) times daily -  with meals and at bedtime. Dissolve in warm water, swish and swallow 90 tablet 3  . dexamethasone (DECADRON) 4 MG tablet Take 1 tablet (4 mg total) by mouth 2 (two) times daily. (Patient not taking: Reported on 07/01/2017) 30 tablet 0  . labetalol (NORMODYNE) 300 MG tablet Take 0.5 tablets (150 mg total) by mouth 3 (three) times daily. 1/2 tab (150 mg) Am and PM (Patient not taking: Reported on 07/09/2017) 270 tablet 1  . losartan (COZAAR) 100 MG tablet TAKE ONE (1) TABLET BY MOUTH EVERY DAY (Patient not taking: Reported on 07/09/2017) 90 tablet 1  . Metronidazole Benzoate POWD     . ondansetron (ZOFRAN-ODT) 4 MG disintegrating tablet     . potassium chloride SA (K-DUR,KLOR-CON) 20 MEQ tablet Take 1 tablet (20 mEq total) by mouth 2 (two) times daily. 30 tablet 3  . ranitidine (ZANTAC) 75 MG tablet Take 75 mg by mouth 2 (two) times daily.    . simvastatin (ZOCOR) 40 MG tablet TAKE ONE (1) TABLET AT BEDTIME (Patient not taking: Reported on 03/19/2017) 90 tablet 1   No current facility-administered medications for this visit.    Facility-Administered Medications Ordered in Other Visits  Medication Dose Route Frequency Provider Last Rate Last Dose  . 0.9 %  sodium chloride infusion   Intravenous Continuous  Cammie Sickle, MD 10 mL/hr at 07/09/17 1205    . sodium chloride 0.9 % 500 mL with potassium chloride 40 mEq infusion   Intravenous Once Jacquelin Hawking, NP          .  PHYSICAL EXAMINATION: ECOG PERFORMANCE STATUS: 0 - Asymptomatic  Vitals:   07/09/17 1050  BP: (!) 158/91  Pulse: 95  Resp: 18  Temp: 97.7 F (36.5 C)   Filed Weights   07/09/17 1050  Weight: 123 lb (55.8 kg)    GENERAL: Well-nourished well-developed; Alert, no distress and comfortable.   Accompanied byAnother daughter.  EYES: no pallor or icterus OROPHARYNX: no thrush or ulceration; good dentition  NECK: supple, no masses felt LYMPH:  no palpable lymphadenopathy in the cervical, axillary or inguinal regions LUNGS: clear to auscultation and  No wheeze or crackles HEART/CVS: regular rate & rhythm and no murmurs; No lower extremity  edema ABDOMEN: abdomen soft, non-tender and normal bowel sounds Musculoskeletal:no cyanosis of digits and no clubbing  PSYCH: alert & oriented x 3 with fluent speech NEURO: no focal motor/sensory deficits SKIN:  no rashes or significant lesions  LABORATORY DATA:  I have reviewed the data as listed Lab Results  Component Value Date   WBC 7.6 07/09/2017   HGB 11.1 (L) 07/09/2017   HCT 31.9 (L) 07/09/2017   MCV 88.8 07/09/2017   PLT 325 07/09/2017    Recent Labs  06/13/17 1347  07/03/17 1532 07/05/17 1325 07/05/17 1655 07/06/17 0429 07/09/17 1005  NA 130*  < > 131* 129*  --  132* 134*  K 3.8  < > 3.1* 3.5  --  3.3* 2.8*  CL 97*  < > 93* 95*  --  100* 102  CO2 27  < > 29 27  --  27 27  GLUCOSE 123*  < > 120* 130*  --  124* 110*  BUN 29*  < > 16 24*  --  20 18  CREATININE 0.89  < > 0.90 1.00  --  0.69 0.86  CALCIUM 9.1  < > 9.1 9.3  --  8.1* 8.7*  GFRNONAA 57*  < > 57* 50*  --  >60 60*  GFRAA >60  < > >60 58*  --  >60 >60  PROT 6.7  --  7.3  --  7.1  --   --   ALBUMIN 3.2*  --  3.5  --  3.3*  --   --   AST 24  --  37  --  25  --   --   ALT 16  --  24  --   20  --   --   ALKPHOS 95  --  108  --  110  --   --   BILITOT 0.6  --  0.6  --  0.6  --   --   BILIDIR  --   --  <0.1*  --  0.1  --   --   IBILI  --   --  NOT CALCULATED  --  0.5  --   --   < > = values in this interval not displayed.  RADIOGRAPHIC STUDIES: I have personally reviewed the radiological images as listed and agreed with the findings in the report. Ct Abdomen Pelvis W Contrast  Result Date: 07/05/2017 CLINICAL DATA:  Lower abdominal pain, couple days of diarrhea, history of esophageal cancer post radiation therapy and chemotherapy that ended in May, additional history of hypertension, former smoker EXAM: CT ABDOMEN AND PELVIS WITH CONTRAST TECHNIQUE: Multidetector CT imaging of the abdomen and pelvis was performed using the standard protocol following bolus administration of intravenous contrast. Sagittal and coronal MPR images reconstructed from axial data set. CONTRAST:  134m ISOVUE-300 IOPAMIDOL (ISOVUE-300) INJECTION 61% IV. No oral contrast. COMPARISON:  CT abdomen and pelvis 03/14/2017; PET-CT 03/24/2017 FINDINGS: Lower chest: 8 x 5 mm irregular nodule LEFT lower lobe image 10, previously 9 x 7 mm. Linear subsegmental atelectasis RIGHT lower lobe. Hepatobiliary: Probable mild fatty infiltration of liver adjacent to falciform fissure. Gallbladder and liver otherwise normal appearance Pancreas: Normal appearance Spleen: Normal appearance Adrenals/Urinary Tract: Minimal thickening of adrenal glands without discrete mass. Small cyst upper pole LEFT kidney image 26. Enhancing mass within the posterior LEFT kidney, 21 x 20 mm cyst with washout on delayed images question renal neoplasm, grossly unchanged. No additional year tarry mass. No hydronephrosis or hydroureter. Bladder unremarkable. Stomach/Bowel:  Wall thickening of distal esophagus likely related to patient's known tumor. Stomach decompressed. Small bowel loops unremarkable. Appendix not localized but no definite evidence of a  pericecal inflammatory process is seen. Wall thickening of sigmoid colon with mild hazy infiltration of pericolic fat, few sigmoid diverticular noted, question colitis or diverticulitis. No evidence of perforation or abscess. Remainder of colon unremarkable. Vascular/Lymphatic: Scattered pelvic phleboliths. Atherosclerotic calcifications aorta and iliac arteries as well as within celiac axis and SMA. No aortic aneurysm. Few normal sized retroperitoneal nodes. No definite adenopathy. Reproductive: Atrophic uterus and ovaries. Other: No free air or free fluid.  No hernia. Musculoskeletal: Diffuse osseous demineralization. No focal bone lesions. IMPRESSION: Wall thickening of the sigmoid colon with hazy infiltration of pericolic fat and note of scattered sigmoid diverticula, question diverticulitis versus colitis. Wall thickening of the distal esophagus likely related to patient's primary tumor. Slight decrease in size of a LEFT lower lobe pulmonary nodule. Enhancing mass LEFT kidney 21 x 20 mm question neoplasm not significantly changed. Aortic Atherosclerosis (ICD10-I70.0). Electronically Signed   By: Lavonia Dana M.D.   On: 07/05/2017 18:23   Nm Pet Image Restag (ps) Skull Base To Thigh  Result Date: 07/07/2017 CLINICAL DATA:  Subsequent treatment strategy for esophageal cancer. EXAM: NUCLEAR MEDICINE PET SKULL BASE TO THIGH TECHNIQUE: 12.6 mCi F-18 FDG was injected intravenously. Full-ring PET imaging was performed from the skull base to thigh after the radiotracer. CT data was obtained and used for attenuation correction and anatomic localization. FASTING BLOOD GLUCOSE:  Value: 100 mg/dl COMPARISON:  Multiple exams, including 03/24/2017 FINDINGS: NECK No hypermetabolic lymph nodes in the neck. Bilateral carotid atherosclerotic calcification. CHEST The distal esophageal mass has a maximum standard uptake value of 5.8 (formerly 8.3). Slight indistinctness of adjacent tissue planes or small adjacent lymph nodes,  not appreciably changed. Coronary, aortic arch, and branch vessel atherosclerotic vascular disease. Mild biapical pleuroparenchymal scarring. Old granulomatous disease. Calcified granuloma in the right middle lobe. Mild scarring in the right lower lobe. Mild scarring in the lingula. Curvilinear left lower lobe nodule, 0.8 by 0.5 cm, formerly 0.8 by 0.9 cm, no current appreciable metabolic activity. ABDOMEN/PELVIS No abnormal hypermetabolic activity within the liver, pancreas, adrenal glands, or spleen. No hypermetabolic lymph nodes in the abdomen or pelvis. The patient has a known enhancing mass in the left mid kidney which based on diagnostic postcontrast CT images would be considered compatible with renal cell carcinoma. The lesion has metabolic activity similar to the rest of the kidney and is accordingly very inconspicuous on PET-CT. Physiologic activity in bowel. Vicarious excretion of contrast from recent CT scan into the gallbladder. Aortoiliac atherosclerotic vascular disease. Sigmoid colon diverticulosis. Mild wall thickening along the sigmoid colon could reflect diverticulitis, and there is new accentuated activity in the vicinity of this wall thickening with maximum SUV of 2 9.0. This region of the bowel was not hypermetabolic on prior PET-CT from 03/24/2017 and accordingly inflammatory etiology is favored over neoplastic. SKELETON No focal hypermetabolic activity to suggest skeletal metastasis. IMPRESSION: 1. Reduced activity in the distal esophageal mass, previous 8.3, current maximum SUV 5.8. Slightly indistinct margins with stranding or tiny adjacent paraesophageal lymph nodes, morphology similar to prior. 2. The patient has a known enhancing mass in the left mid kidney, very indistinct on PET-CT given that the metabolic activity of this lesion is similar to that of the surrounding kidney. Probably a renal cell carcinoma. 3. Sigmoid diverticulitis, with some new accentuated metabolic activity in the  vicinity of this inflammation. 4.  The curving left lower lobe nodule is reduced in size compared to the prior exam. Surveillance likely warranted. 5.  Aortic Atherosclerosis (ICD10-I70.0).  Coronary atherosclerosis. Electronically Signed   By: Van Clines M.D.   On: 07/07/2017 13:28   Dg Chest Port 1 View  Result Date: 07/03/2017 CLINICAL DATA:  Cough and shortness of breath.  Esophageal carcinoma EXAM: PORTABLE CHEST 1 VIEW COMPARISON:  PET-CT March 24, 2017; chest radiograph December 31, 2013 FINDINGS: There is no edema or consolidation. Heart size is upper normal with pulmonary vascularity within normal limits. No adenopathy evident. There is aortic atherosclerosis. There is calcification in each carotid artery. There is no obvious esophageal thickening by radiography. No bone lesions. IMPRESSION: No edema or consolidation. No evident adenopathy. Aortic atherosclerosis. There is carotid artery calcification bilaterally. Aortic Atherosclerosis (ICD10-I70.0). Electronically Signed   By: Lowella Grip III M.D.   On: 07/03/2017 16:21    ASSESSMENT & PLAN:   Malignant neoplasm of lower third of esophagus ([place]) # Moderate differentiated adenocarcinoma of the lower esophagus. PET- positive paraesophgeal uptake.Txn1- stage III. S/p  concurrent chemoradiation- carbo-taxol weekly [finished May 21st]. PET scan- July 11- 2018- improved SUV uptake at the lower end of the esophagus.  # Poor by mouth intake/dysphagia- status post recent EGD. Malignant stricture. Discussed with Dr. Vicente Males. Given the family's preference discussed with Dr. Allen Norris- who kindly agreed to see the patient next week to discuss the treatment options including- possible stenting. Declines PEG tube  # Left lower lobe centimeter lung nodule- question etiology. slightly smaller. Monitor for now  # Left renal mass- suspicious for malignancy. However monitor for now  # Diverticulitis- currently on antibiotics improving. Follow  clinically.  # Hypokalemia/dehydration-recommend 40 mg potassium; recommend IV fluids  # Follow up weekly BMP/IV fluids potassium; follow-up with me in 4 weeks.   # 40 minutes face-to-face with the patient discussing the above plan of care; more than 50% of time spent on prognosis/ natural history; counseling and coordination.  # I reviewed the blood work- with the patient in detail; also reviewed the imaging independently [as summarized above]; and with the patient in detail.        Cammie Sickle, MD 07/09/2017 1:25 PM

## 2017-07-09 NOTE — Assessment & Plan Note (Addendum)
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 PET- positive paraesophgeal uptake.Txn1- stage III. S/p  concurrent chemoradiation- carbo-taxol weekly [finished May 21st]. PET scan- July 11- 2018- improved SUV uptake at the lower end of the esophagus.  # Poor by mouth intake/dysphagia- status post recent EGD. Malignant stricture. Discussed with Dr. Vicente Males. Given the family's preference discussed with Dr. Allen Norris- who kindly agreed to see the patient next week to discuss the treatment options including- possible stenting. Declines PEG tube  # Left lower lobe centimeter lung nodule- question etiology. slightly smaller. Monitor for now  # Left renal mass- suspicious for malignancy. However monitor for now  # Diverticulitis- currently on antibiotics improving. Follow clinically.  # Hypokalemia/dehydration-recommend 40 mg potassium; recommend IV fluids  # Follow up weekly BMP/IV fluids potassium; follow-up with me in 4 weeks.   # 40 minutes face-to-face with the patient discussing the above plan of care; more than 50% of time spent on prognosis/ natural history; counseling and coordination.  # I reviewed the blood work- with the patient in detail; also reviewed the imaging independently [as summarized above]; and with the patient in detail.

## 2017-07-12 ENCOUNTER — Telehealth: Payer: Self-pay

## 2017-07-12 NOTE — Telephone Encounter (Signed)
  Oncology Nurse Navigator Documentation Received call from Daughter, Brittney Jackson. Brittney Jackson had a new onset of ankle swelling over the weekend. She stopped taking oral potassium since that was only thing that had change in her care. She denies any other symptoms including SOB or pain. States swelling is some better today but is increasing with being up and moving. Daughter asking if Dr. Holley Raring should be involved with electrolyte balance. Notified Dr. Rogue Bussing with above information. He is okay with her seeing Dr. Holley Raring for this. Notified Brittney Jackson regarding. She will call and make the appointment. Also encouraged to keep legs elevated. Navigator Location: [facility] (07/12/17 1000)   )Navigator Encounter Type: Telephone (07/12/17 1000) Telephone: Symptom Mgt (07/12/17 1000)                                                  Time Spent with Patient: 15 (07/12/17 1000)

## 2017-07-12 NOTE — Telephone Encounter (Signed)
Spoke with pt regarding scheduling an office appt with Dr. Allen Norris. She has requested I contact her daughter, Jeani Hawking to schedule. Left vm for Jeani Hawking to return my call to schedule.   Requested by Dr. Rogue Bussing for pt to be seen this week.

## 2017-07-12 NOTE — Telephone Encounter (Signed)
Pt's daughter returned call and has scheduled an office visit with Dr. Allen Norris on Tuesday, July 17th in our [place] office.

## 2017-07-13 ENCOUNTER — Ambulatory Visit (INDEPENDENT_AMBULATORY_CARE_PROVIDER_SITE_OTHER): Payer: Medicare Other | Admitting: Gastroenterology

## 2017-07-13 ENCOUNTER — Other Ambulatory Visit: Payer: Self-pay

## 2017-07-13 ENCOUNTER — Encounter: Payer: Self-pay | Admitting: Gastroenterology

## 2017-07-13 VITALS — BP 182/71 | HR 112 | Temp 97.6°F | Ht 60.0 in | Wt 124.2 lb

## 2017-07-13 DIAGNOSIS — C155 Malignant neoplasm of lower third of esophagus: Secondary | ICD-10-CM

## 2017-07-13 NOTE — Progress Notes (Signed)
Primary Care Physician: Crecencio Mc, MD  Primary Gastroenterologist:  Dr. [name]  Chief Complaint  Patient presents with  . Esophageal Cancer    HPI: Brittney Jackson is a 83 y.o. female here for follow-up of her esophageal cancer.  The patient has had a lot of difficulty swallowing and was seen by Dr. Vicente Males who did an upper endoscopy on her. The patient was told that she may need a PEG tube and wanted a second opinion and comes to see me today.  The patient had an EGD with a stricture in the distal esophagus that appeared to be a malignant stricture.  The patient has had PET scans that she reports to have been without any signs of metastases.  The patient was coming today to talk about possible other options besides the PEG tube and more questions about the PEG tube.  Current Outpatient Prescriptions  Medication Sig Dispense Refill  . ciprofloxacin (CIPRO) 500 MG/5ML (10%) suspension Take 5 mLs (500 mg total) by mouth 2 (two) times daily. 100 mL 0  . HYDROcodone-acetaminophen (NORCO) 5-325 MG tablet Take 1 tablet by mouth every 6 (six) hours as needed for moderate pain. 60 tablet 0  . levothyroxine (SYNTHROID, LEVOTHROID) 88 MCG tablet TAKE ONE (1) TABLET EACH DAY 90 tablet 1  . metroNIDAZOLE (FLAGYL) 50 mg/ml oral suspension Take 10 mLs (500 mg total) by mouth 3 (three) times daily. 300 mL 0  . Metronidazole Benzoate POWD     . ondansetron (ZOFRAN-ODT) 4 MG disintegrating tablet     . potassium chloride SA (K-DUR,KLOR-CON) 20 MEQ tablet Take 1 tablet (20 mEq total) by mouth 2 (two) times daily. 30 tablet 3  . ranitidine (ZANTAC) 75 MG tablet Take 75 mg by mouth 2 (two) times daily.    . sucralfate (CARAFATE) 1 g tablet Take 1 tablet (1 g total) by mouth 4 (four) times daily -  with meals and at bedtime. Dissolve in warm water, swish and swallow 90 tablet 3  . dexamethasone (DECADRON) 4 MG tablet Take 1 tablet (4 mg total) by mouth 2 (two) times daily. (Patient not taking:  Reported on 07/01/2017) 30 tablet 0  . labetalol (NORMODYNE) 300 MG tablet Take 0.5 tablets (150 mg total) by mouth 3 (three) times daily. 1/2 tab (150 mg) Am and PM (Patient not taking: Reported on 07/09/2017) 270 tablet 1  . losartan (COZAAR) 100 MG tablet TAKE ONE (1) TABLET BY MOUTH EVERY DAY (Patient not taking: Reported on 07/09/2017) 90 tablet 1  . simvastatin (ZOCOR) 40 MG tablet TAKE ONE (1) TABLET AT BEDTIME (Patient not taking: Reported on 03/19/2017) 90 tablet 1   No current facility-administered medications for this visit.     Allergies as of 07/13/2017 - Review Complete 07/13/2017  Allergen Reaction Noted  . Amlodipine Swelling 01/09/2014    ROS:  General: Negative for anorexia, weight loss, fever, chills, fatigue, weakness. ENT: Negative for hoarseness, difficulty swallowing , nasal congestion. CV: Negative for chest pain, angina, palpitations, dyspnea on exertion, peripheral edema.  Respiratory: Negative for dyspnea at rest, dyspnea on exertion, cough, sputum, wheezing.  GI: See history of present illness. GU:  Negative for dysuria, hematuria, urinary incontinence, urinary frequency, nocturnal urination.  Endo: Negative for unusual weight change.    Physical Examination:   BP (!) 182/71   Pulse (!) 112   Temp 97.6 F (36.4 C) (Oral)   Ht 5' (1.524 m)   Wt 124 lb 3.2 oz (56.3 kg)  BMI 24.26 kg/m   General: Well-nourished, well-developed in no acute distress.  Eyes: No icterus. Conjunctivae pink. Mouth: Oropharyngeal mucosa moist and pink , no lesions erythema or exudate. Lungs: Clear to auscultation bilaterally. Non-labored. Heart: Regular rate and rhythm, no murmurs rubs or gallops.  Abdomen: Bowel sounds are normal, nontender, nondistended, no hepatosplenomegaly or masses, no abdominal bruits or hernia , no rebound or guarding.   Extremities: No lower extremity edema. No clubbing or deformities. Neuro: Alert and oriented x 3.  Grossly intact. Skin: Warm and dry,  no jaundice.   Psych: Alert and cooperative, normal mood and affect.  Labs:    Imaging Studies: Ct Abdomen Pelvis W Contrast  Result Date: 07/05/2017 CLINICAL DATA:  Lower abdominal pain, couple days of diarrhea, history of esophageal cancer post radiation therapy and chemotherapy that ended in May, additional history of hypertension, former smoker EXAM: CT ABDOMEN AND PELVIS WITH CONTRAST TECHNIQUE: Multidetector CT imaging of the abdomen and pelvis was performed using the standard protocol following bolus administration of intravenous contrast. Sagittal and coronal MPR images reconstructed from axial data set. CONTRAST:  12mL ISOVUE-300 IOPAMIDOL (ISOVUE-300) INJECTION 61% IV. No oral contrast. COMPARISON:  CT abdomen and pelvis 03/14/2017; PET-CT 03/24/2017 FINDINGS: Lower chest: 8 x 5 mm irregular nodule LEFT lower lobe image 10, previously 9 x 7 mm. Linear subsegmental atelectasis RIGHT lower lobe. Hepatobiliary: Probable mild fatty infiltration of liver adjacent to falciform fissure. Gallbladder and liver otherwise normal appearance Pancreas: Normal appearance Spleen: Normal appearance Adrenals/Urinary Tract: Minimal thickening of adrenal glands without discrete mass. Small cyst upper pole LEFT kidney image 26. Enhancing mass within the posterior LEFT kidney, 21 x 20 mm cyst with washout on delayed images question renal neoplasm, grossly unchanged. No additional year tarry mass. No hydronephrosis or hydroureter. Bladder unremarkable. Stomach/Bowel: Wall thickening of distal esophagus likely related to patient's known tumor. Stomach decompressed. Small bowel loops unremarkable. Appendix not localized but no definite evidence of a pericecal inflammatory process is seen. Wall thickening of sigmoid colon with mild hazy infiltration of pericolic fat, few sigmoid diverticular noted, question colitis or diverticulitis. No evidence of perforation or abscess. Remainder of colon unremarkable. Vascular/Lymphatic:  Scattered pelvic phleboliths. Atherosclerotic calcifications aorta and iliac arteries as well as within celiac axis and SMA. No aortic aneurysm. Few normal sized retroperitoneal nodes. No definite adenopathy. Reproductive: Atrophic uterus and ovaries. Other: No free air or free fluid.  No hernia. Musculoskeletal: Diffuse osseous demineralization. No focal bone lesions. IMPRESSION: Wall thickening of the sigmoid colon with hazy infiltration of pericolic fat and note of scattered sigmoid diverticula, question diverticulitis versus colitis. Wall thickening of the distal esophagus likely related to patient's primary tumor. Slight decrease in size of a LEFT lower lobe pulmonary nodule. Enhancing mass LEFT kidney 21 x 20 mm question neoplasm not significantly changed. Aortic Atherosclerosis (ICD10-I70.0). Electronically Signed   By: Lavonia Dana M.D.   On: 07/05/2017 18:23   Nm Pet Image Restag (ps) Skull Base To Thigh  Result Date: 07/07/2017 CLINICAL DATA:  Subsequent treatment strategy for esophageal cancer. EXAM: NUCLEAR MEDICINE PET SKULL BASE TO THIGH TECHNIQUE: 12.6 mCi F-18 FDG was injected intravenously. Full-ring PET imaging was performed from the skull base to thigh after the radiotracer. CT data was obtained and used for attenuation correction and anatomic localization. FASTING BLOOD GLUCOSE:  Value: 100 mg/dl COMPARISON:  Multiple exams, including 03/24/2017 FINDINGS: NECK No hypermetabolic lymph nodes in the neck. Bilateral carotid atherosclerotic calcification. CHEST The distal esophageal mass has a maximum standard  uptake value of 5.8 (formerly 8.3). Slight indistinctness of adjacent tissue planes or small adjacent lymph nodes, not appreciably changed. Coronary, aortic arch, and branch vessel atherosclerotic vascular disease. Mild biapical pleuroparenchymal scarring. Old granulomatous disease. Calcified granuloma in the right middle lobe. Mild scarring in the right lower lobe. Mild scarring in the  lingula. Curvilinear left lower lobe nodule, 0.8 by 0.5 cm, formerly 0.8 by 0.9 cm, no current appreciable metabolic activity. ABDOMEN/PELVIS No abnormal hypermetabolic activity within the liver, pancreas, adrenal glands, or spleen. No hypermetabolic lymph nodes in the abdomen or pelvis. The patient has a known enhancing mass in the left mid kidney which based on diagnostic postcontrast CT images would be considered compatible with renal cell carcinoma. The lesion has metabolic activity similar to the rest of the kidney and is accordingly very inconspicuous on PET-CT. Physiologic activity in bowel. Vicarious excretion of contrast from recent CT scan into the gallbladder. Aortoiliac atherosclerotic vascular disease. Sigmoid colon diverticulosis. Mild wall thickening along the sigmoid colon could reflect diverticulitis, and there is new accentuated activity in the vicinity of this wall thickening with maximum SUV of 2 9.0. This region of the bowel was not hypermetabolic on prior PET-CT from 03/24/2017 and accordingly inflammatory etiology is favored over neoplastic. SKELETON No focal hypermetabolic activity to suggest skeletal metastasis. IMPRESSION: 1. Reduced activity in the distal esophageal mass, previous 8.3, current maximum SUV 5.8. Slightly indistinct margins with stranding or tiny adjacent paraesophageal lymph nodes, morphology similar to prior. 2. The patient has a known enhancing mass in the left mid kidney, very indistinct on PET-CT given that the metabolic activity of this lesion is similar to that of the surrounding kidney. Probably a renal cell carcinoma. 3. Sigmoid diverticulitis, with some new accentuated metabolic activity in the vicinity of this inflammation. 4. The curving left lower lobe nodule is reduced in size compared to the prior exam. Surveillance likely warranted. 5.  Aortic Atherosclerosis (ICD10-I70.0).  Coronary atherosclerosis. Electronically Signed   By: Van Clines M.D.   On:  07/07/2017 13:28   Dg Chest Port 1 View  Result Date: 07/03/2017 CLINICAL DATA:  Cough and shortness of breath.  Esophageal carcinoma EXAM: PORTABLE CHEST 1 VIEW COMPARISON:  PET-CT March 24, 2017; chest radiograph December 31, 2013 FINDINGS: There is no edema or consolidation. Heart size is upper normal with pulmonary vascularity within normal limits. No adenopathy evident. There is aortic atherosclerosis. There is calcification in each carotid artery. There is no obvious esophageal thickening by radiography. No bone lesions. IMPRESSION: No edema or consolidation. No evident adenopathy. Aortic atherosclerosis. There is carotid artery calcification bilaterally. Aortic Atherosclerosis (ICD10-I70.0). Electronically Signed   By: Lowella Grip III M.D.   On: 07/03/2017 16:21    Assessment and Plan:   KERISHA GOUGHNOUR is a 83 y.o. y/o female who has a history of esophageal cancer with a PET scan showing reduced activity in the distal esophagus with diverticulitis. The patient was told about the different options including feeding tube placement with bolus feedings, possible esophageal dilation due to a possible stricture due to the radiation in addition to the tumor.  The patient has also been told that a esophageal stents could be placed in the esophagus but this would result in regurgitation and reflux and could be painful.  The patient would like to try and see if gentle dilation of her esophagus would be helpful.  If this does not work the patient is entertaining the idea of putting in a feeding tube. I have  discussed risks & benefits which include, but are not limited to, bleeding, infection, perforation & drug reaction.  The patient agrees with this plan & written consent will be obtained.       [name], MD. Marval Regal   Note: This dictation was prepared with Dragon dictation along with smaller phrase technology. Any transcriptional errors that result from this process are unintentional.

## 2017-07-14 ENCOUNTER — Encounter: Payer: Self-pay | Admitting: *Deleted

## 2017-07-14 ENCOUNTER — Encounter
Admission: RE | Disposition: A | Discharge status: Home / Self Care | Payer: Self-pay | Source: Ambulatory Visit | Attending: Gastroenterology

## 2017-07-14 ENCOUNTER — Ambulatory Visit: Payer: Medicare Other | Admitting: Anesthesiology

## 2017-07-14 ENCOUNTER — Ambulatory Visit
Admission: RE | Admit: 2017-07-14 | Discharge: 2017-07-14 | Disposition: A | Discharge status: Home / Self Care | Payer: Medicare Other | Source: Ambulatory Visit | Attending: Gastroenterology | Admitting: Gastroenterology

## 2017-07-14 DIAGNOSIS — I700 Atherosclerosis of aorta: Secondary | ICD-10-CM | POA: Diagnosis not present

## 2017-07-14 DIAGNOSIS — Z9221 Personal history of antineoplastic chemotherapy: Secondary | ICD-10-CM | POA: Diagnosis not present

## 2017-07-14 DIAGNOSIS — K2270 Barrett's esophagus without dysplasia: Secondary | ICD-10-CM | POA: Diagnosis not present

## 2017-07-14 DIAGNOSIS — K222 Esophageal obstruction: Secondary | ICD-10-CM | POA: Diagnosis not present

## 2017-07-14 DIAGNOSIS — R1310 Dysphagia, unspecified: Secondary | ICD-10-CM | POA: Diagnosis not present

## 2017-07-14 DIAGNOSIS — E039 Hypothyroidism, unspecified: Secondary | ICD-10-CM | POA: Diagnosis not present

## 2017-07-14 DIAGNOSIS — C155 Malignant neoplasm of lower third of esophagus: Secondary | ICD-10-CM

## 2017-07-14 DIAGNOSIS — Z87891 Personal history of nicotine dependence: Secondary | ICD-10-CM | POA: Insufficient documentation

## 2017-07-14 DIAGNOSIS — Z79899 Other long term (current) drug therapy: Secondary | ICD-10-CM | POA: Diagnosis not present

## 2017-07-14 DIAGNOSIS — D490 Neoplasm of unspecified behavior of digestive system: Secondary | ICD-10-CM | POA: Diagnosis not present

## 2017-07-14 DIAGNOSIS — I10 Essential (primary) hypertension: Secondary | ICD-10-CM | POA: Diagnosis not present

## 2017-07-14 DIAGNOSIS — Z923 Personal history of irradiation: Secondary | ICD-10-CM | POA: Diagnosis not present

## 2017-07-14 HISTORY — PX: ESOPHAGOGASTRODUODENOSCOPY (EGD) WITH PROPOFOL: SHX5813

## 2017-07-14 LAB — POCT I-STAT 4, (NA,K, GLUC, HGB,HCT)
Glucose, Bld: 111 mg/dL — ABNORMAL HIGH (ref 65–99)
HCT: 33 % — ABNORMAL LOW (ref 36.0–46.0)
HEMOGLOBIN: 11.2 g/dL — AB (ref 12.0–15.0)
Potassium: 3.1 mmol/L — ABNORMAL LOW (ref 3.5–5.1)
SODIUM: 139 mmol/L (ref 135–145)

## 2017-07-14 SURGERY — ESOPHAGOGASTRODUODENOSCOPY (EGD) WITH PROPOFOL
Anesthesia: General

## 2017-07-14 MED ORDER — LIDOCAINE HCL (PF) 2 % IJ SOLN
INTRAMUSCULAR | Status: AC
  Filled 2017-07-14: qty 2

## 2017-07-14 MED ORDER — PROPOFOL 500 MG/50ML IV EMUL
INTRAVENOUS | Status: DC | PRN
  Administered 2017-07-14: 140 ug/kg/min via INTRAVENOUS

## 2017-07-14 MED ORDER — PROPOFOL 500 MG/50ML IV EMUL
INTRAVENOUS | Status: AC
  Filled 2017-07-14: qty 50

## 2017-07-14 MED ORDER — LIDOCAINE HCL (CARDIAC) 20 MG/ML IV SOLN
INTRAVENOUS | Status: DC | PRN
  Administered 2017-07-14: 60 mg via INTRATRACHEAL

## 2017-07-14 MED ORDER — SODIUM CHLORIDE 0.9 % IV SOLN
INTRAVENOUS | Status: DC
  Administered 2017-07-14: 1000 mL via INTRAVENOUS

## 2017-07-14 MED ORDER — PROPOFOL 10 MG/ML IV BOLUS
INTRAVENOUS | Status: DC | PRN
  Administered 2017-07-14: 20 mg via INTRAVENOUS
  Administered 2017-07-14: 40 mg via INTRAVENOUS
  Administered 2017-07-14: 10 mg via INTRAVENOUS

## 2017-07-14 MED ORDER — GLYCOPYRROLATE 0.2 MG/ML IJ SOLN
INTRAMUSCULAR | Status: DC | PRN
  Administered 2017-07-14: 0.2 mg via INTRAVENOUS

## 2017-07-14 NOTE — Interval H&P Note (Signed)
History and Physical Interval Note:  07/14/2017 11:19 AM  Brittney Jackson  has presented today for surgery, with the diagnosis of Malignant neoplasm of lower third of esophagus C15.5  The various methods of treatment have been discussed with the patient and family. After consideration of risks, benefits and other options for treatment, the patient has consented to  Procedure(s): ESOPHAGOGASTRODUODENOSCOPY (EGD) WITH PROPOFOL with dilation (N/A) as a surgical intervention .  The patient's history has been reviewed, patient examined, no change in status, stable for surgery.  I have reviewed the patient's chart and labs.  Questions were answered to the patient's satisfaction.     Sandy Haye [employer]

## 2017-07-14 NOTE — Anesthesia Preprocedure Evaluation (Signed)
Anesthesia Evaluation  Patient identified by MRN, date of birth, ID band Patient awake    Reviewed: Allergy & Precautions, H&P , NPO status , Patient's Chart, lab work & pertinent test results  History of Anesthesia Complications Negative for: history of anesthetic complications  Airway Mallampati: III  TM Distance: <3 FB Neck ROM: limited    Dental  (+) Poor Dentition, Chipped, Caps   Pulmonary neg shortness of breath, former smoker,           Cardiovascular Exercise Tolerance: Good hypertension, (-) angina(-) Past MI and (-) DOE      Neuro/Psych negative neurological ROS  negative psych ROS   GI/Hepatic negative GI ROS, Neg liver ROS, neg GERD  ,  Endo/Other  Hypothyroidism   Renal/GU Renal disease  negative genitourinary   Musculoskeletal  (+) Arthritis ,   Abdominal   Peds  Hematology negative hematology ROS (+)   Anesthesia Other Findings Past Medical History: No date: Arthritis     Comment:  left knee No date: Cancer ([place])     Comment:  esophageal cancer No date: Function kidney decreased No date: Hypertension No date: Hypothyroidism No date: Inappropriate ADH secretion (HCC) No date: Low sodium levels No date: Osteoporosis No date: Renal disorder  Past Surgical History: 05/29/2015: CATARACT EXTRACTION W/PHACO; Left     Comment:  Procedure: CATARACT EXTRACTION PHACO AND INTRAOCULAR               LENS PLACEMENT (IOC);  Surgeon: Leandrew Koyanagi, MD;               Location: [place];  Service: Ophthalmology;                Laterality: Left; 01/15/2016: CATARACT EXTRACTION W/PHACO; Right     Comment:  Procedure: CATARACT EXTRACTION PHACO AND INTRAOCULAR               LENS PLACEMENT (IOC);  Surgeon: Leandrew Koyanagi, MD;               Location: [place];  Service: Ophthalmology;                Laterality: Right;  [place] 03/16/2017: ESOPHAGOGASTRODUODENOSCOPY (EGD) WITH  PROPOFOL; N/A     Comment:  Procedure: ESOPHAGOGASTRODUODENOSCOPY (EGD) WITH               PROPOFOL;  Surgeon: [name], MD;  Location: [facility]               ENDOSCOPY;  Service: Endoscopy;  Laterality: N/A; 06/25/2017: ESOPHAGOGASTRODUODENOSCOPY (EGD) WITH PROPOFOL; N/A     Comment:  Procedure: ESOPHAGOGASTRODUODENOSCOPY (EGD) WITH               PROPOFOL;  Surgeon: Jonathon Bellows, MD;  Location: [facility]               ENDOSCOPY;  Service: Endoscopy;  Laterality: N/A; No date: TONSILLECTOMY     Reproductive/Obstetrics negative OB ROS                             Anesthesia Physical Anesthesia Plan  ASA: III  Anesthesia Plan: General   Post-op Pain Management:    Induction: Intravenous  PONV Risk Score and Plan:   Airway Management Planned: Natural Airway and Nasal Cannula  Additional Equipment:   Intra-op Plan:   Post-operative Plan:   Informed Consent: I have reviewed the patients History and Physical, chart, labs and discussed the  procedure including the risks, benefits and alternatives for the proposed anesthesia with the patient or authorized representative who has indicated his/her understanding and acceptance.   Dental Advisory Given  Plan Discussed with: Anesthesiologist, CRNA and Surgeon  Anesthesia Plan Comments: (Plan to re check serum K before we proceed  Patient consented for risks of anesthesia including but not limited to:  - adverse reactions to medications - risk of intubation if required - damage to teeth, lips or other oral mucosa - sore throat or hoarseness - Damage to heart, brain, lungs or loss of life  Patient voiced understanding.)        Anesthesia Quick Evaluation

## 2017-07-14 NOTE — Anesthesia Post-op Follow-up Note (Cosign Needed)
Anesthesia QCDR form completed.        

## 2017-07-14 NOTE — Op Note (Signed)
[facility] Gastroenterology Patient Name: Brittney Jackson Procedure Date: 07/14/2017 10:55 AM MRN: [id number] Account #: [id number] Date of Birth: 1930-07-07 Admit Type: Outpatient Age: 83 Room: [facility] ENDO ROOM 4 Gender: Female Note Status: Finalized Procedure:            Upper GI endoscopy Indications:          For palliative treatment of stenosing neoplasm of the                        esophagus, Dysphagia Providers:            [name] MD, MD Referring MD:         Deborra Medina, MD (Referring MD) Medicines:            Propofol per Anesthesia Complications:        No immediate complications. Procedure:            Pre-Anesthesia Assessment:                       - Prior to the procedure, a History and Physical was                        performed, and patient medications and allergies were                        reviewed. The patient's tolerance of previous                        anesthesia was also reviewed. The risks and benefits of                        the procedure and the sedation options and risks were                        discussed with the patient. All questions were                        answered, and informed consent was obtained. Prior                        Anticoagulants: The patient has taken no previous                        anticoagulant or antiplatelet agents. ASA Grade                        Assessment: III - A patient with severe systemic                        disease. After reviewing the risks and benefits, the                        patient was deemed in satisfactory condition to undergo                        the procedure.                       After obtaining informed consent, the endoscope was  passed under direct vision. Throughout the procedure,                        the patient's blood pressure, pulse, and oxygen                        saturations were monitored continuously. The                         Colonoscope was introduced through the mouth, and                        advanced to the second part of duodenum. The upper GI                        endoscopy was accomplished without difficulty. The                        patient tolerated the procedure well. Findings:      One moderate stenosis was found in the lower third of the esophagus. And       was traversed. A TTS dilator was passed through the scope. Dilation with       a 12-13.5-15 mm balloon dilator was performed to 15 mm. The dilation       site was examined following endoscope reinsertion and showed moderate       improvement in luminal narrowing.      The stomach was normal.      The examined duodenum was normal. Impression:           - Esophageal stenosis. Dilated.                       - Normal stomach.                       - Normal examined duodenum.                       - No specimens collected. Recommendation:       - Discharge patient to home.                       - Resume previous diet.                       - Continue present medications. Procedure Code(s):    --- Professional ---                       [phone], Esophagogastroduodenoscopy, flexible, transoral;                        with transendoscopic balloon dilation of esophagus                        (less than 30 mm diameter) Diagnosis Code(s):    --- Professional ---                       R13.10, Dysphagia, unspecified                       K22.2, Esophageal obstruction  D49.0, Neoplasm of unspecified behavior of digestive                        system CPT copyright 2016 [employer]. All rights reserved. The codes documented in this report are preliminary and upon coder review may  be revised to meet current compliance requirements. [name] MD, MD 07/14/2017 11:28:11 AM This report has been signed electronically. Number of Addenda: 0 Note Initiated On: 07/14/2017 10:55 AM      [facility]

## 2017-07-14 NOTE — H&P (View-Only) (Signed)
Primary Care Physician: Crecencio Mc, MD  Primary Gastroenterologist:  Dr. [name]  Chief Complaint  Patient presents with  . Esophageal Cancer    HPI: Brittney Jackson is a 83 y.o. female here for follow-up of her esophageal cancer.  The patient has had a lot of difficulty swallowing and was seen by Dr. Vicente Males who did an upper endoscopy on her. The patient was told that she may need a PEG tube and wanted a second opinion and comes to see me today.  The patient had an EGD with a stricture in the distal esophagus that appeared to be a malignant stricture.  The patient has had PET scans that she reports to have been without any signs of metastases.  The patient was coming today to talk about possible other options besides the PEG tube and more questions about the PEG tube.  Current Outpatient Prescriptions  Medication Sig Dispense Refill  . ciprofloxacin (CIPRO) 500 MG/5ML (10%) suspension Take 5 mLs (500 mg total) by mouth 2 (two) times daily. 100 mL 0  . HYDROcodone-acetaminophen (NORCO) 5-325 MG tablet Take 1 tablet by mouth every 6 (six) hours as needed for moderate pain. 60 tablet 0  . levothyroxine (SYNTHROID, LEVOTHROID) 88 MCG tablet TAKE ONE (1) TABLET EACH DAY 90 tablet 1  . metroNIDAZOLE (FLAGYL) 50 mg/ml oral suspension Take 10 mLs (500 mg total) by mouth 3 (three) times daily. 300 mL 0  . Metronidazole Benzoate POWD     . ondansetron (ZOFRAN-ODT) 4 MG disintegrating tablet     . potassium chloride SA (K-DUR,KLOR-CON) 20 MEQ tablet Take 1 tablet (20 mEq total) by mouth 2 (two) times daily. 30 tablet 3  . ranitidine (ZANTAC) 75 MG tablet Take 75 mg by mouth 2 (two) times daily.    . sucralfate (CARAFATE) 1 g tablet Take 1 tablet (1 g total) by mouth 4 (four) times daily -  with meals and at bedtime. Dissolve in warm water, swish and swallow 90 tablet 3  . dexamethasone (DECADRON) 4 MG tablet Take 1 tablet (4 mg total) by mouth 2 (two) times daily. (Patient not taking:  Reported on 07/01/2017) 30 tablet 0  . labetalol (NORMODYNE) 300 MG tablet Take 0.5 tablets (150 mg total) by mouth 3 (three) times daily. 1/2 tab (150 mg) Am and PM (Patient not taking: Reported on 07/09/2017) 270 tablet 1  . losartan (COZAAR) 100 MG tablet TAKE ONE (1) TABLET BY MOUTH EVERY DAY (Patient not taking: Reported on 07/09/2017) 90 tablet 1  . simvastatin (ZOCOR) 40 MG tablet TAKE ONE (1) TABLET AT BEDTIME (Patient not taking: Reported on 03/19/2017) 90 tablet 1   No current facility-administered medications for this visit.     Allergies as of 07/13/2017 - Review Complete 07/13/2017  Allergen Reaction Noted  . Amlodipine Swelling 01/09/2014    ROS:  General: Negative for anorexia, weight loss, fever, chills, fatigue, weakness. ENT: Negative for hoarseness, difficulty swallowing , nasal congestion. CV: Negative for chest pain, angina, palpitations, dyspnea on exertion, peripheral edema.  Respiratory: Negative for dyspnea at rest, dyspnea on exertion, cough, sputum, wheezing.  GI: See history of present illness. GU:  Negative for dysuria, hematuria, urinary incontinence, urinary frequency, nocturnal urination.  Endo: Negative for unusual weight change.    Physical Examination:   BP (!) 182/71   Pulse (!) 112   Temp 97.6 F (36.4 C) (Oral)   Ht 5' (1.524 m)   Wt 124 lb 3.2 oz (56.3 kg)  BMI 24.26 kg/m   General: Well-nourished, well-developed in no acute distress.  Eyes: No icterus. Conjunctivae pink. Mouth: Oropharyngeal mucosa moist and pink , no lesions erythema or exudate. Lungs: Clear to auscultation bilaterally. Non-labored. Heart: Regular rate and rhythm, no murmurs rubs or gallops.  Abdomen: Bowel sounds are normal, nontender, nondistended, no hepatosplenomegaly or masses, no abdominal bruits or hernia , no rebound or guarding.   Extremities: No lower extremity edema. No clubbing or deformities. Neuro: Alert and oriented x 3.  Grossly intact. Skin: Warm and dry,  no jaundice.   Psych: Alert and cooperative, normal mood and affect.  Labs:    Imaging Studies: Ct Abdomen Pelvis W Contrast  Result Date: 07/05/2017 CLINICAL DATA:  Lower abdominal pain, couple days of diarrhea, history of esophageal cancer post radiation therapy and chemotherapy that ended in May, additional history of hypertension, former smoker EXAM: CT ABDOMEN AND PELVIS WITH CONTRAST TECHNIQUE: Multidetector CT imaging of the abdomen and pelvis was performed using the standard protocol following bolus administration of intravenous contrast. Sagittal and coronal MPR images reconstructed from axial data set. CONTRAST:  11mL ISOVUE-300 IOPAMIDOL (ISOVUE-300) INJECTION 61% IV. No oral contrast. COMPARISON:  CT abdomen and pelvis 03/14/2017; PET-CT 03/24/2017 FINDINGS: Lower chest: 8 x 5 mm irregular nodule LEFT lower lobe image 10, previously 9 x 7 mm. Linear subsegmental atelectasis RIGHT lower lobe. Hepatobiliary: Probable mild fatty infiltration of liver adjacent to falciform fissure. Gallbladder and liver otherwise normal appearance Pancreas: Normal appearance Spleen: Normal appearance Adrenals/Urinary Tract: Minimal thickening of adrenal glands without discrete mass. Small cyst upper pole LEFT kidney image 26. Enhancing mass within the posterior LEFT kidney, 21 x 20 mm cyst with washout on delayed images question renal neoplasm, grossly unchanged. No additional year tarry mass. No hydronephrosis or hydroureter. Bladder unremarkable. Stomach/Bowel: Wall thickening of distal esophagus likely related to patient's known tumor. Stomach decompressed. Small bowel loops unremarkable. Appendix not localized but no definite evidence of a pericecal inflammatory process is seen. Wall thickening of sigmoid colon with mild hazy infiltration of pericolic fat, few sigmoid diverticular noted, question colitis or diverticulitis. No evidence of perforation or abscess. Remainder of colon unremarkable. Vascular/Lymphatic:  Scattered pelvic phleboliths. Atherosclerotic calcifications aorta and iliac arteries as well as within celiac axis and SMA. No aortic aneurysm. Few normal sized retroperitoneal nodes. No definite adenopathy. Reproductive: Atrophic uterus and ovaries. Other: No free air or free fluid.  No hernia. Musculoskeletal: Diffuse osseous demineralization. No focal bone lesions. IMPRESSION: Wall thickening of the sigmoid colon with hazy infiltration of pericolic fat and note of scattered sigmoid diverticula, question diverticulitis versus colitis. Wall thickening of the distal esophagus likely related to patient's primary tumor. Slight decrease in size of a LEFT lower lobe pulmonary nodule. Enhancing mass LEFT kidney 21 x 20 mm question neoplasm not significantly changed. Aortic Atherosclerosis (ICD10-I70.0). Electronically Signed   By: Lavonia Dana M.D.   On: 07/05/2017 18:23   Nm Pet Image Restag (ps) Skull Base To Thigh  Result Date: 07/07/2017 CLINICAL DATA:  Subsequent treatment strategy for esophageal cancer. EXAM: NUCLEAR MEDICINE PET SKULL BASE TO THIGH TECHNIQUE: 12.6 mCi F-18 FDG was injected intravenously. Full-ring PET imaging was performed from the skull base to thigh after the radiotracer. CT data was obtained and used for attenuation correction and anatomic localization. FASTING BLOOD GLUCOSE:  Value: 100 mg/dl COMPARISON:  Multiple exams, including 03/24/2017 FINDINGS: NECK No hypermetabolic lymph nodes in the neck. Bilateral carotid atherosclerotic calcification. CHEST The distal esophageal mass has a maximum standard  uptake value of 5.8 (formerly 8.3). Slight indistinctness of adjacent tissue planes or small adjacent lymph nodes, not appreciably changed. Coronary, aortic arch, and branch vessel atherosclerotic vascular disease. Mild biapical pleuroparenchymal scarring. Old granulomatous disease. Calcified granuloma in the right middle lobe. Mild scarring in the right lower lobe. Mild scarring in the  lingula. Curvilinear left lower lobe nodule, 0.8 by 0.5 cm, formerly 0.8 by 0.9 cm, no current appreciable metabolic activity. ABDOMEN/PELVIS No abnormal hypermetabolic activity within the liver, pancreas, adrenal glands, or spleen. No hypermetabolic lymph nodes in the abdomen or pelvis. The patient has a known enhancing mass in the left mid kidney which based on diagnostic postcontrast CT images would be considered compatible with renal cell carcinoma. The lesion has metabolic activity similar to the rest of the kidney and is accordingly very inconspicuous on PET-CT. Physiologic activity in bowel. Vicarious excretion of contrast from recent CT scan into the gallbladder. Aortoiliac atherosclerotic vascular disease. Sigmoid colon diverticulosis. Mild wall thickening along the sigmoid colon could reflect diverticulitis, and there is new accentuated activity in the vicinity of this wall thickening with maximum SUV of 2 9.0. This region of the bowel was not hypermetabolic on prior PET-CT from 03/24/2017 and accordingly inflammatory etiology is favored over neoplastic. SKELETON No focal hypermetabolic activity to suggest skeletal metastasis. IMPRESSION: 1. Reduced activity in the distal esophageal mass, previous 8.3, current maximum SUV 5.8. Slightly indistinct margins with stranding or tiny adjacent paraesophageal lymph nodes, morphology similar to prior. 2. The patient has a known enhancing mass in the left mid kidney, very indistinct on PET-CT given that the metabolic activity of this lesion is similar to that of the surrounding kidney. Probably a renal cell carcinoma. 3. Sigmoid diverticulitis, with some new accentuated metabolic activity in the vicinity of this inflammation. 4. The curving left lower lobe nodule is reduced in size compared to the prior exam. Surveillance likely warranted. 5.  Aortic Atherosclerosis (ICD10-I70.0).  Coronary atherosclerosis. Electronically Signed   By: Van Clines M.D.   On:  07/07/2017 13:28   Dg Chest Port 1 View  Result Date: 07/03/2017 CLINICAL DATA:  Cough and shortness of breath.  Esophageal carcinoma EXAM: PORTABLE CHEST 1 VIEW COMPARISON:  PET-CT March 24, 2017; chest radiograph December 31, 2013 FINDINGS: There is no edema or consolidation. Heart size is upper normal with pulmonary vascularity within normal limits. No adenopathy evident. There is aortic atherosclerosis. There is calcification in each carotid artery. There is no obvious esophageal thickening by radiography. No bone lesions. IMPRESSION: No edema or consolidation. No evident adenopathy. Aortic atherosclerosis. There is carotid artery calcification bilaterally. Aortic Atherosclerosis (ICD10-I70.0). Electronically Signed   By: Lowella Grip III M.D.   On: 07/03/2017 16:21    Assessment and Plan:   Brittney Jackson is a 83 y.o. y/o female who has a history of esophageal cancer with a PET scan showing reduced activity in the distal esophagus with diverticulitis. The patient was told about the different options including feeding tube placement with bolus feedings, possible esophageal dilation due to a possible stricture due to the radiation in addition to the tumor.  The patient has also been told that a esophageal stents could be placed in the esophagus but this would result in regurgitation and reflux and could be painful.  The patient would like to try and see if gentle dilation of her esophagus would be helpful.  If this does not work the patient is entertaining the idea of putting in a feeding tube. I have  discussed risks & benefits which include, but are not limited to, bleeding, infection, perforation & drug reaction.  The patient agrees with this plan & written consent will be obtained.       [name], MD. Marval Regal   Note: This dictation was prepared with Dragon dictation along with smaller phrase technology. Any transcriptional errors that result from this process are unintentional.

## 2017-07-14 NOTE — Anesthesia Procedure Notes (Signed)
Performed by: Demetrius Charity Pre-anesthesia Checklist: Emergency Drugs available, Patient identified, Suction available, Patient being monitored and Timeout performed Patient Re-evaluated:Patient Re-evaluated prior to induction Oxygen Delivery Method: Nasal cannula Induction Type: IV induction

## 2017-07-14 NOTE — Transfer of Care (Signed)
Immediate Anesthesia Transfer of Care Note  Patient: Brittney Jackson  Procedure(s) Performed: Procedure(s): ESOPHAGOGASTRODUODENOSCOPY (EGD) WITH PROPOFOL with dilation (N/A)  Patient Location: PACU  Anesthesia Type:General  Level of Consciousness: sedated  Airway & Oxygen Therapy: Patient Spontanous Breathing and Patient connected to nasal cannula oxygen  Post-op Assessment: Report given to RN and Post -op Vital signs reviewed and stable  Post vital signs: Reviewed and stable  Last Vitals:  Vitals:   07/14/17 1034  BP: (!) 175/86  Pulse: (!) 101  Resp: 20  Temp: (!) 35.7 C    Last Pain:  Vitals:   07/14/17 1034  TempSrc: Oral  PainSc: 0-No pain         Complications: No apparent anesthesia complications

## 2017-07-14 NOTE — Progress Notes (Signed)
   07/06/17 0942  PT Time Calculation  PT Start Time (ACUTE ONLY) 0905  PT Stop Time (ACUTE ONLY) 0920  PT Time Calculation (min) (ACUTE ONLY) 15 min  PT G-Codes **NOT FOR INPATIENT CLASS**  Functional Assessment Tool Used AM-PAC 6 Clicks Basic Mobility  Functional Limitation Mobility: Walking and moving around  Mobility: Walking and Moving Around Current Status (L9357) [facility]  Mobility: Walking and Moving Around Goal Status (S1779) [facility]  Mobility: Walking and Moving Around Discharge Status (T9030) [place]  PT General Charges  $$ ACUTE PT VISIT 1 Procedure  PT Evaluation  $PT Eval Low Complexity 1 Procedure   Late-entry g-codes added after review of initial evaluation/documentation.  Quantavius Humm H. Owens Shark, PT, DPT, NCS 07/14/17, 8:36 AM [phone]

## 2017-07-15 ENCOUNTER — Encounter: Payer: Self-pay | Admitting: Gastroenterology

## 2017-07-15 NOTE — Anesthesia Postprocedure Evaluation (Signed)
Anesthesia Post Note  Patient: DAISEE CENTNER  Procedure(s) Performed: Procedure(s) (LRB): ESOPHAGOGASTRODUODENOSCOPY (EGD) WITH PROPOFOL with dilation (N/A)  Patient location during evaluation: Endoscopy Anesthesia Type: General Level of consciousness: awake and alert Pain management: pain level controlled Vital Signs Assessment: post-procedure vital signs reviewed and stable Respiratory status: spontaneous breathing, nonlabored ventilation, respiratory function stable and patient connected to nasal cannula oxygen Cardiovascular status: blood pressure returned to baseline and stable Postop Assessment: no signs of nausea or vomiting Anesthetic complications: no     Last Vitals:  Vitals:   07/14/17 1150 07/14/17 1200  BP: (!) 169/99 (!) 166/90  Pulse: 99 100  Resp: 20 (!) 22  Temp:      Last Pain:  Vitals:   07/15/17 0743  TempSrc:   PainSc: 0-No pain                 Precious Haws Piscitello

## 2017-07-16 ENCOUNTER — Inpatient Hospital Stay (HOSPITAL_BASED_OUTPATIENT_CLINIC_OR_DEPARTMENT_OTHER): Payer: Medicare Other | Admitting: Internal Medicine

## 2017-07-16 ENCOUNTER — Inpatient Hospital Stay: Payer: Medicare Other

## 2017-07-16 DIAGNOSIS — Z923 Personal history of irradiation: Secondary | ICD-10-CM

## 2017-07-16 DIAGNOSIS — R911 Solitary pulmonary nodule: Secondary | ICD-10-CM | POA: Diagnosis not present

## 2017-07-16 DIAGNOSIS — M25471 Effusion, right ankle: Secondary | ICD-10-CM

## 2017-07-16 DIAGNOSIS — Z79899 Other long term (current) drug therapy: Secondary | ICD-10-CM | POA: Diagnosis not present

## 2017-07-16 DIAGNOSIS — M25472 Effusion, left ankle: Secondary | ICD-10-CM | POA: Diagnosis not present

## 2017-07-16 DIAGNOSIS — R1310 Dysphagia, unspecified: Secondary | ICD-10-CM | POA: Diagnosis not present

## 2017-07-16 DIAGNOSIS — N2889 Other specified disorders of kidney and ureter: Secondary | ICD-10-CM

## 2017-07-16 DIAGNOSIS — Z9221 Personal history of antineoplastic chemotherapy: Secondary | ICD-10-CM

## 2017-07-16 DIAGNOSIS — E876 Hypokalemia: Secondary | ICD-10-CM

## 2017-07-16 DIAGNOSIS — C155 Malignant neoplasm of lower third of esophagus: Secondary | ICD-10-CM | POA: Diagnosis not present

## 2017-07-16 LAB — BASIC METABOLIC PANEL
Anion gap: 6 (ref 5–15)
BUN: 14 mg/dL (ref 6–20)
CO2: 29 mmol/L (ref 22–32)
Calcium: 9.1 mg/dL (ref 8.9–10.3)
Chloride: 100 mmol/L — ABNORMAL LOW (ref 101–111)
Creatinine, Ser: 1.55 mg/dL — ABNORMAL HIGH (ref 0.44–1.00)
GFR, EST AFRICAN AMERICAN: 34 mL/min — AB (ref >60)
GFR, EST NON AFRICAN AMERICAN: 29 mL/min — AB (ref >60)
Glucose, Bld: 126 mg/dL — ABNORMAL HIGH (ref 65–99)
POTASSIUM: 3 mmol/L — AB (ref 3.5–5.1)
SODIUM: 135 mmol/L (ref 135–145)

## 2017-07-16 LAB — CBC WITH DIFFERENTIAL/PLATELET
BASOS ABS: 0 10*3/uL (ref 0–0.1)
Basophils Relative: 1 %
EOS PCT: 1 %
Eosinophils Absolute: 0.1 10*3/uL (ref 0–0.7)
HCT: 32.1 % — ABNORMAL LOW (ref 35.0–47.0)
HEMOGLOBIN: 11.3 g/dL — AB (ref 12.0–16.0)
LYMPHS PCT: 11 %
Lymphs Abs: 0.7 10*3/uL — ABNORMAL LOW (ref 1.0–3.6)
MCH: 31.2 pg (ref 26.0–34.0)
MCHC: 35.2 g/dL (ref 32.0–36.0)
MCV: 88.7 fL (ref 80.0–100.0)
Monocytes Absolute: 1 10*3/uL — ABNORMAL HIGH (ref 0.2–0.9)
Monocytes Relative: 16 %
NEUTROS PCT: 71 %
Neutro Abs: 4.3 10*3/uL (ref 1.4–6.5)
PLATELETS: 418 10*3/uL (ref 150–440)
RBC: 3.62 MIL/uL — AB (ref 3.80–5.20)
RDW: 16.6 % — ABNORMAL HIGH (ref 11.5–14.5)
WBC: 6 10*3/uL (ref 3.6–11.0)

## 2017-07-16 MED ORDER — SODIUM CHLORIDE 0.9 % IV SOLN
Freq: Once | INTRAVENOUS | Status: AC
  Administered 2017-07-16: 10:00:00 via INTRAVENOUS
  Filled 2017-07-16: qty 20

## 2017-07-16 NOTE — Progress Notes (Signed)
Patient here for acute add on with Dr. Rogue Bussing.  Daughter voices "concerns about's GFR level/creatinine". Family concerned that if pt "receives IVF today then mom's renal function will be affected." pt had esohageal stent placed under the care of Dr. Allen Norris last week. Since this pt feels that she is able to swallow fluids and routine medications. She has been able to to intake her oral potassium 20 meq daily. However, pt stopped taking this on her own due to ankle edema.

## 2017-07-16 NOTE — Progress Notes (Signed)
[facility] CONSULT NOTE  Patient Care Team: Crecencio Mc, MD as PCP - General (Internal Medicine) Clent Jacks, RN as Registered Nurse  CHIEF COMPLAINTS/PURPOSE OF CONSULTATION: Esophageal cancer  #  Oncology History   # MARCH 2018- ADENO CA; mod diff [lower third of esophagus; EGD Bx; Dr.Wohl]; CT- A/P- no distant mets/LN. PET- TxN1; no EUS.  # April 5th 2018- Carbo-taxol with RT [until May 22nd 2018]; July 12h PET-improved.    # Dysphagia- s/p EGD [Dr. Vicente Males; July 2018]  # March 2018- LEFT KIDNEY COMPLEX MASS ~2cm [incidental]; ~ 2 cm left lower lobe nodule [question second primary]  # Hx of Hyponatremia [[phone]; Dr.Lateef]  # MOLECULAR TESTING- Her 2 neu-NEGATIVE; PDL-1- POSITIVE [CPS-2]; MMR- STABLE.      Malignant neoplasm of lower third of esophagus (HCC)     HISTORY OF PRESENTING ILLNESS:  Brittney Jackson 83 y.o.  female diagnosed adenocarcinoma the Lower esophagus currently s/p  chemoradiation is here for follow-up [finished RT on may 21st] Is here for follow-upGiven the concerns of leg swelling/hypokalemia.  Patient was evaluated by Dr.Wohl- underwent EGD with dilatation of the stricture. Patient states that she has been able to eat and drink better. She states that she has been taking boost up to 2 a day.  She is also been taking potassium pills.  She complains of leg swelling has gotten worse in the last few days/mostly in the ankles. Worse towards the end of the day. Denies any shortness of breath or chest pain. No new cough. Abdominal pain has resolved.  ROS: A complete 10 point review of system is done which is negative except mentioned above in history of present illness  MEDICAL HISTORY:  Past Medical History:  Diagnosis Date  . Arthritis    left knee  . Cancer ([place])    esophageal cancer  . Function kidney decreased   . Hypertension   . Hypothyroidism   . Inappropriate ADH secretion (HCC)   . Low sodium levels   .  Osteoporosis   . Renal disorder     SURGICAL HISTORY: Past Surgical History:  Procedure Laterality Date  . CATARACT EXTRACTION W/PHACO Left 05/29/2015   Procedure: CATARACT EXTRACTION PHACO AND INTRAOCULAR LENS PLACEMENT (IOC);  Surgeon: Leandrew Koyanagi, MD;  Location: [facility];  Service: Ophthalmology;  Laterality: Left;  . CATARACT EXTRACTION W/PHACO Right 01/15/2016   Procedure: CATARACT EXTRACTION PHACO AND INTRAOCULAR LENS PLACEMENT (IOC);  Surgeon: Leandrew Koyanagi, MD;  Location: [facility];  Service: Ophthalmology;  Laterality: Right;  SHUGARCAINE  . ESOPHAGOGASTRODUODENOSCOPY (EGD) WITH PROPOFOL N/A 03/16/2017   Procedure: ESOPHAGOGASTRODUODENOSCOPY (EGD) WITH PROPOFOL;  Surgeon: [name], MD;  Location: [facility] ENDOSCOPY;  Service: Endoscopy;  Laterality: N/A;  . ESOPHAGOGASTRODUODENOSCOPY (EGD) WITH PROPOFOL N/A 06/25/2017   Procedure: ESOPHAGOGASTRODUODENOSCOPY (EGD) WITH PROPOFOL;  Surgeon: Jonathon Bellows, MD;  Location: [facility] ENDOSCOPY;  Service: Endoscopy;  Laterality: N/A;  . ESOPHAGOGASTRODUODENOSCOPY (EGD) WITH PROPOFOL N/A 07/14/2017   Procedure: ESOPHAGOGASTRODUODENOSCOPY (EGD) WITH PROPOFOL with dilation;  Surgeon: [name], MD;  Location: [facility] ENDOSCOPY;  Service: Endoscopy;  Laterality: N/A;  . 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 Family is in the area.  Social History   Social History  . Marital status: Widowed    Spouse name: N/A  . Number of children: N/A  . Years of education: N/A   Occupational History  . 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 Smoking status: Former Smoker    Types: Cigarettes    Quit date: 07/21/2000  . Smokeless tobacco: Never Used  . Alcohol use No  . Drug use: No  . Sexual activity: No   Other Topics Concern  . Not on file   Social History Narrative   Church activities, community service.   Lives  alone.    FAMILY HISTORY: Family History  Problem Relation Age of Onset  . Heart attack Mother   . Cancer Maternal Uncle        renal cancer   . COPD Neg Hx        no breast, ovarian or colon    ALLERGIES:  is allergic to amlodipine.  MEDICATIONS:  Current Outpatient Prescriptions  Medication Sig Dispense Refill  . ciprofloxacin (CIPRO) 500 MG/5ML (10%) suspension Take 5 mLs (500 mg total) by mouth 2 (two) times daily. 100 mL 0  . levothyroxine (SYNTHROID, LEVOTHROID) 88 MCG tablet TAKE ONE (1) TABLET EACH DAY 90 tablet 1  . metroNIDAZOLE (FLAGYL) 50 mg/ml oral suspension Take 10 mLs (500 mg total) by mouth 3 (three) times daily. 300 mL 0  . ranitidine (ZANTAC) 75 MG tablet Take 75 mg by mouth 2 (two) times daily.    . sucralfate (CARAFATE) 1 g tablet Take 1 tablet (1 g total) by mouth 4 (four) times daily -  with meals and at bedtime. Dissolve in warm water, swish and swallow 90 tablet 3  . HYDROcodone-acetaminophen (NORCO) 5-325 MG tablet Take 1 tablet by mouth every 6 (six) hours as needed for moderate pain. (Patient not taking: Reported on 07/16/2017) 60 tablet 0  . labetalol (NORMODYNE) 300 MG tablet Take 0.5 tablets (150 mg total) by mouth 3 (three) times daily. 1/2 tab (150 mg) Am and PM (Patient not taking: Reported on 07/16/2017) 270 tablet 1  . losartan (COZAAR) 100 MG tablet TAKE ONE (1) TABLET BY MOUTH EVERY DAY (Patient not taking: Reported on 07/16/2017) 90 tablet 1  . ondansetron (ZOFRAN-ODT) 4 MG disintegrating tablet Take 4 mg by mouth every 8 (eight) hours as needed.     . potassium chloride SA (K-DUR,KLOR-CON) 20 MEQ tablet Take 1 tablet (20 mEq total) by mouth 2 (two) times daily. (Patient not taking: Reported on 07/16/2017) 30 tablet 3   No current facility-administered medications for this visit.       Marland Kitchen  PHYSICAL EXAMINATION: ECOG PERFORMANCE STATUS: 0 - Asymptomatic  Vitals:   07/16/17 1040  BP: (!) 184/95  Pulse: 88  Resp: 16  Temp: 98.7 F (37.1 C)    Filed Weights   07/16/17 1040  Weight: 122 lb 4.8 oz (55.5 kg)    GENERAL: Well-nourished well-developed; Alert, no distress and comfortable.   Accompanied daughter.  EYES: no pallor or icterus OROPHARYNX: no thrush or ulceration; good dentition  NECK: supple, no masses felt LYMPH:  no palpable lymphadenopathy in the cervical, axillary or inguinal regions LUNGS: clear to auscultation and  No wheeze or crackles HEART/CVS: regular rate & rhythm and no murmurs; positive for ankle edema bilaterally. ABDOMEN: abdomen soft, non-tender and normal bowel sounds Musculoskeletal:no cyanosis of digits and no clubbing  PSYCH: alert & oriented x 3 with fluent speech NEURO: no focal motor/sensory deficits SKIN:  no rashes or significant lesions  LABORATORY DATA:  I have reviewed the data as listed Lab Results  Component Value Date   WBC 6.0 07/16/2017   HGB 11.3 (L) 07/16/2017   HCT 32.1 (L)  07/16/2017   MCV 88.7 07/16/2017   PLT 418 07/16/2017    Recent Labs  06/13/17 1347  07/03/17 1532  07/05/17 1655 07/06/17 0429 07/09/17 1005 07/14/17 1055 07/16/17 0905  NA 130*  < > 131*  < >  --  132* 134* 139 135  K 3.8  < > 3.1*  < >  --  3.3* 2.8* 3.1* 3.0*  CL 97*  < > 93*  < >  --  100* 102  --  100*  CO2 27  < > 29  < >  --  27 27  --  29  GLUCOSE 123*  < > 120*  < >  --  124* 110* 111* 126*  BUN 29*  < > 16  < >  --  20 18  --  14  CREATININE 0.89  < > 0.90  < >  --  0.69 0.86  --  1.55*  CALCIUM 9.1  < > 9.1  < >  --  8.1* 8.7*  --  9.1  GFRNONAA 57*  < > 57*  < >  --  >60 60*  --  29*  GFRAA >60  < > >60  < >  --  >60 >60  --  34*  PROT 6.7  --  7.3  --  7.1  --   --   --   --   ALBUMIN 3.2*  --  3.5  --  3.3*  --   --   --   --   AST 24  --  37  --  25  --   --   --   --   ALT 16  --  24  --  20  --   --   --   --   ALKPHOS 95  --  108  --  110  --   --   --   --   BILITOT 0.6  --  0.6  --  0.6  --   --   --   --   BILIDIR  --   --  <0.1*  --  0.1  --   --   --   --    IBILI  --   --  NOT CALCULATED  --  0.5  --   --   --   --   < > = values in this interval not displayed.  RADIOGRAPHIC STUDIES: I have personally reviewed the radiological images as listed and agreed with the findings in the report. Ct Abdomen Pelvis W Contrast  Result Date: 07/05/2017 CLINICAL DATA:  Lower abdominal pain, couple days of diarrhea, history of esophageal cancer post radiation therapy and chemotherapy that ended in May, additional history of hypertension, former smoker EXAM: CT ABDOMEN AND PELVIS WITH CONTRAST TECHNIQUE: Multidetector CT imaging of the abdomen and pelvis was performed using the standard protocol following bolus administration of intravenous contrast. Sagittal and coronal MPR images reconstructed from axial data set. CONTRAST:  1106m ISOVUE-300 IOPAMIDOL (ISOVUE-300) INJECTION 61% IV. No oral contrast. COMPARISON:  CT abdomen and pelvis 03/14/2017; PET-CT 03/24/2017 FINDINGS: Lower chest: 8 x 5 mm irregular nodule LEFT lower lobe image 10, previously 9 x 7 mm. Linear subsegmental atelectasis RIGHT lower lobe. Hepatobiliary: Probable mild fatty infiltration of liver adjacent to falciform fissure. Gallbladder and liver otherwise normal appearance Pancreas: Normal appearance Spleen: Normal appearance Adrenals/Urinary Tract: Minimal thickening of adrenal glands without discrete mass. Small cyst upper pole LEFT kidney image 26.  Enhancing mass within the posterior LEFT kidney, 21 x 20 mm cyst with washout on delayed images question renal neoplasm, grossly unchanged. No additional year tarry mass. No hydronephrosis or hydroureter. Bladder unremarkable. Stomach/Bowel: Wall thickening of distal esophagus likely related to patient's known tumor. Stomach decompressed. Small bowel loops unremarkable. Appendix not localized but no definite evidence of a pericecal inflammatory process is seen. Wall thickening of sigmoid colon with mild hazy infiltration of pericolic fat, few sigmoid  diverticular noted, question colitis or diverticulitis. No evidence of perforation or abscess. Remainder of colon unremarkable. Vascular/Lymphatic: Scattered pelvic phleboliths. Atherosclerotic calcifications aorta and iliac arteries as well as within celiac axis and SMA. No aortic aneurysm. Few normal sized retroperitoneal nodes. No definite adenopathy. Reproductive: Atrophic uterus and ovaries. Other: No free air or free fluid.  No hernia. Musculoskeletal: Diffuse osseous demineralization. No focal bone lesions. IMPRESSION: Wall thickening of the sigmoid colon with hazy infiltration of pericolic fat and note of scattered sigmoid diverticula, question diverticulitis versus colitis. Wall thickening of the distal esophagus likely related to patient's primary tumor. Slight decrease in size of a LEFT lower lobe pulmonary nodule. Enhancing mass LEFT kidney 21 x 20 mm question neoplasm not significantly changed. Aortic Atherosclerosis (ICD10-I70.0). Electronically Signed   By: Lavonia Dana M.D.   On: 07/05/2017 18:23   Nm Pet Image Restag (ps) Skull Base To Thigh  Result Date: 07/07/2017 CLINICAL DATA:  Subsequent treatment strategy for esophageal cancer. EXAM: NUCLEAR MEDICINE PET SKULL BASE TO THIGH TECHNIQUE: 12.6 mCi F-18 FDG was injected intravenously. Full-ring PET imaging was performed from the skull base to thigh after the radiotracer. CT data was obtained and used for attenuation correction and anatomic localization. FASTING BLOOD GLUCOSE:  Value: 100 mg/dl COMPARISON:  Multiple exams, including 03/24/2017 FINDINGS: NECK No hypermetabolic lymph nodes in the neck. Bilateral carotid atherosclerotic calcification. CHEST The distal esophageal mass has a maximum standard uptake value of 5.8 (formerly 8.3). Slight indistinctness of adjacent tissue planes or small adjacent lymph nodes, not appreciably changed. Coronary, aortic arch, and branch vessel atherosclerotic vascular disease. Mild biapical pleuroparenchymal  scarring. Old granulomatous disease. Calcified granuloma in the right middle lobe. Mild scarring in the right lower lobe. Mild scarring in the lingula. Curvilinear left lower lobe nodule, 0.8 by 0.5 cm, formerly 0.8 by 0.9 cm, no current appreciable metabolic activity. ABDOMEN/PELVIS No abnormal hypermetabolic activity within the liver, pancreas, adrenal glands, or spleen. No hypermetabolic lymph nodes in the abdomen or pelvis. The patient has a known enhancing mass in the left mid kidney which based on diagnostic postcontrast CT images would be considered compatible with renal cell carcinoma. The lesion has metabolic activity similar to the rest of the kidney and is accordingly very inconspicuous on PET-CT. Physiologic activity in bowel. Vicarious excretion of contrast from recent CT scan into the gallbladder. Aortoiliac atherosclerotic vascular disease. Sigmoid colon diverticulosis. Mild wall thickening along the sigmoid colon could reflect diverticulitis, and there is new accentuated activity in the vicinity of this wall thickening with maximum SUV of 2 9.0. This region of the bowel was not hypermetabolic on prior PET-CT from 03/24/2017 and accordingly inflammatory etiology is favored over neoplastic. SKELETON No focal hypermetabolic activity to suggest skeletal metastasis. IMPRESSION: 1. Reduced activity in the distal esophageal mass, previous 8.3, current maximum SUV 5.8. Slightly indistinct margins with stranding or tiny adjacent paraesophageal lymph nodes, morphology similar to prior. 2. The patient has a known enhancing mass in the left mid kidney, very indistinct on PET-CT given that the metabolic  activity of this lesion is similar to that of the surrounding kidney. Probably a renal cell carcinoma. 3. Sigmoid diverticulitis, with some new accentuated metabolic activity in the vicinity of this inflammation. 4. The curving left lower lobe nodule is reduced in size compared to the prior exam. Surveillance  likely warranted. 5.  Aortic Atherosclerosis (ICD10-I70.0).  Coronary atherosclerosis. Electronically Signed   By: Van Clines M.D.   On: 07/07/2017 13:28   Dg Chest Port 1 View  Result Date: 07/03/2017 CLINICAL DATA:  Cough and shortness of breath.  Esophageal carcinoma EXAM: PORTABLE CHEST 1 VIEW COMPARISON:  PET-CT March 24, 2017; chest radiograph December 31, 2013 FINDINGS: There is no edema or consolidation. Heart size is upper normal with pulmonary vascularity within normal limits. No adenopathy evident. There is aortic atherosclerosis. There is calcification in each carotid artery. There is no obvious esophageal thickening by radiography. No bone lesions. IMPRESSION: No edema or consolidation. No evident adenopathy. Aortic atherosclerosis. There is carotid artery calcification bilaterally. Aortic Atherosclerosis (ICD10-I70.0). Electronically Signed   By: Lowella Grip III M.D.   On: 07/03/2017 16:21    ASSESSMENT & PLAN:   Malignant neoplasm of lower third of esophagus ([place]) # Moderate differentiated adenocarcinoma of the lower esophagus. PET- positive paraesophgeal uptake.Txn1- stage III. S/p  concurrent chemoradiation- carbo-taxol weekly [finished May 21st].   # PET scan- July 11- 2018- improved SUV uptake at the lower end of the esophagus; no evidence of any distant metastatic disease. No concerns for progression/recurrence at this time. Patient is currently on surveillance. No active treatments planned at this time.  # Difficulty swallowing - second malignancy/radiation- status post dilatation. Improved. Encouraged protein intake.  # # Hypokalemia/intravascular depletion-/ dehydration- creatinine 1.5/baseline 0.9-recommend 40 mg potassium at home; recommend IV fluids. Has appt with Dr.Lateef early next week. We will inform.   # Bilateral ankle swelling- likely dependent/malnutrition- recommend leg elevation/stockings.  # Left lower lobe centimeter lung nodule- question  etiology. slightly smaller. Monitor for now  # Left renal mass- suspicious for malignancy. Stable on the recent PET scan. Monitor for now  # Follow up weekly BMP/IV fluids potassium;keep appts as planned.   Cc; Dr.Lateef.      Cammie Sickle, MD 07/16/2017 1:15 PM

## 2017-07-16 NOTE — Assessment & Plan Note (Addendum)
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 PET- positive paraesophgeal uptake.Txn1- stage III. S/p  concurrent chemoradiation- carbo-taxol weekly [finished May 21st].   # PET scan- July 11- 2018- improved SUV uptake at the lower end of the esophagus; no evidence of any distant metastatic disease. No concerns for progression/recurrence at this time. Patient is currently on surveillance. No active treatments planned at this time.  # Difficulty swallowing - second malignancy/radiation- status post dilatation. Improved. Encouraged protein intake.  # # Hypokalemia/intravascular depletion-/ dehydration- creatinine 1.5/baseline 0.9-recommend 40 mg potassium at home; recommend IV fluids. Has appt with Dr.Lateef early next week. We will inform.   # Bilateral ankle swelling- likely dependent/malnutrition- recommend leg elevation/stockings.  # Left lower lobe centimeter lung nodule- question etiology. slightly smaller. Monitor for now  # Left renal mass- suspicious for malignancy. Stable on the recent PET scan. Monitor for now  # Follow up weekly BMP/IV fluids potassium;keep appts as planned.   Cc; Dr.Lateef.

## 2017-07-19 DIAGNOSIS — N183 Chronic kidney disease, stage 3 (moderate): Secondary | ICD-10-CM | POA: Diagnosis not present

## 2017-07-19 DIAGNOSIS — N2581 Secondary hyperparathyroidism of renal origin: Secondary | ICD-10-CM | POA: Diagnosis not present

## 2017-07-19 DIAGNOSIS — N179 Acute kidney failure, unspecified: Secondary | ICD-10-CM | POA: Diagnosis not present

## 2017-07-19 DIAGNOSIS — E871 Hypo-osmolality and hyponatremia: Secondary | ICD-10-CM | POA: Diagnosis not present

## 2017-07-20 ENCOUNTER — Ambulatory Visit: Payer: Medicare Other

## 2017-07-21 ENCOUNTER — Ambulatory Visit: Payer: Medicare Other | Admitting: Internal Medicine

## 2017-07-21 ENCOUNTER — Other Ambulatory Visit: Payer: Medicare Other

## 2017-07-23 ENCOUNTER — Inpatient Hospital Stay: Payer: Medicare Other

## 2017-07-23 DIAGNOSIS — R911 Solitary pulmonary nodule: Secondary | ICD-10-CM | POA: Diagnosis not present

## 2017-07-23 DIAGNOSIS — R1310 Dysphagia, unspecified: Secondary | ICD-10-CM | POA: Diagnosis not present

## 2017-07-23 DIAGNOSIS — E876 Hypokalemia: Secondary | ICD-10-CM | POA: Diagnosis not present

## 2017-07-23 DIAGNOSIS — C155 Malignant neoplasm of lower third of esophagus: Secondary | ICD-10-CM

## 2017-07-23 DIAGNOSIS — M25471 Effusion, right ankle: Secondary | ICD-10-CM | POA: Diagnosis not present

## 2017-07-23 DIAGNOSIS — M25472 Effusion, left ankle: Secondary | ICD-10-CM | POA: Diagnosis not present

## 2017-07-23 LAB — BASIC METABOLIC PANEL
ANION GAP: 9 (ref 5–15)
BUN: 37 mg/dL — AB (ref 6–20)
CALCIUM: 9.3 mg/dL (ref 8.9–10.3)
CO2: 28 mmol/L (ref 22–32)
CREATININE: 1.35 mg/dL — AB (ref 0.44–1.00)
Chloride: 100 mmol/L — ABNORMAL LOW (ref 101–111)
GFR calc Af Amer: 40 mL/min — ABNORMAL LOW (ref >60)
GFR, EST NON AFRICAN AMERICAN: 35 mL/min — AB (ref >60)
GLUCOSE: 152 mg/dL — AB (ref 65–99)
Potassium: 4 mmol/L (ref 3.5–5.1)
Sodium: 137 mmol/L (ref 135–145)

## 2017-07-29 ENCOUNTER — Ambulatory Visit: Payer: Medicare Other

## 2017-07-30 ENCOUNTER — Other Ambulatory Visit: Payer: Self-pay

## 2017-07-30 ENCOUNTER — Inpatient Hospital Stay: Payer: Medicare Other

## 2017-07-30 ENCOUNTER — Inpatient Hospital Stay: Payer: Medicare Other | Attending: Internal Medicine

## 2017-07-30 DIAGNOSIS — Z87891 Personal history of nicotine dependence: Secondary | ICD-10-CM | POA: Diagnosis not present

## 2017-07-30 DIAGNOSIS — R1310 Dysphagia, unspecified: Secondary | ICD-10-CM | POA: Diagnosis not present

## 2017-07-30 DIAGNOSIS — Z923 Personal history of irradiation: Secondary | ICD-10-CM | POA: Diagnosis not present

## 2017-07-30 DIAGNOSIS — K222 Esophageal obstruction: Secondary | ICD-10-CM | POA: Insufficient documentation

## 2017-07-30 DIAGNOSIS — Z79899 Other long term (current) drug therapy: Secondary | ICD-10-CM | POA: Diagnosis not present

## 2017-07-30 DIAGNOSIS — C155 Malignant neoplasm of lower third of esophagus: Secondary | ICD-10-CM

## 2017-07-30 DIAGNOSIS — E871 Hypo-osmolality and hyponatremia: Secondary | ICD-10-CM | POA: Diagnosis not present

## 2017-07-30 DIAGNOSIS — Z9221 Personal history of antineoplastic chemotherapy: Secondary | ICD-10-CM | POA: Insufficient documentation

## 2017-07-30 DIAGNOSIS — I10 Essential (primary) hypertension: Secondary | ICD-10-CM | POA: Insufficient documentation

## 2017-07-30 DIAGNOSIS — E222 Syndrome of inappropriate secretion of antidiuretic hormone: Secondary | ICD-10-CM | POA: Insufficient documentation

## 2017-07-30 DIAGNOSIS — N2889 Other specified disorders of kidney and ureter: Secondary | ICD-10-CM | POA: Insufficient documentation

## 2017-07-30 DIAGNOSIS — E039 Hypothyroidism, unspecified: Secondary | ICD-10-CM | POA: Diagnosis not present

## 2017-07-30 DIAGNOSIS — M810 Age-related osteoporosis without current pathological fracture: Secondary | ICD-10-CM | POA: Diagnosis not present

## 2017-07-30 DIAGNOSIS — R911 Solitary pulmonary nodule: Secondary | ICD-10-CM | POA: Insufficient documentation

## 2017-07-30 DIAGNOSIS — E876 Hypokalemia: Secondary | ICD-10-CM

## 2017-07-30 DIAGNOSIS — N179 Acute kidney failure, unspecified: Secondary | ICD-10-CM | POA: Diagnosis not present

## 2017-07-30 LAB — BASIC METABOLIC PANEL
Anion gap: 10 (ref 5–15)
BUN: 36 mg/dL — AB (ref 6–20)
CO2: 25 mmol/L (ref 22–32)
CREATININE: 1.4 mg/dL — AB (ref 0.44–1.00)
Calcium: 9.5 mg/dL (ref 8.9–10.3)
Chloride: 104 mmol/L (ref 101–111)
GFR, EST AFRICAN AMERICAN: 39 mL/min — AB (ref >60)
GFR, EST NON AFRICAN AMERICAN: 33 mL/min — AB (ref >60)
Glucose, Bld: 147 mg/dL — ABNORMAL HIGH (ref 65–99)
POTASSIUM: 4 mmol/L (ref 3.5–5.1)
SODIUM: 139 mmol/L (ref 135–145)

## 2017-08-06 ENCOUNTER — Inpatient Hospital Stay: Payer: Medicare Other

## 2017-08-06 ENCOUNTER — Inpatient Hospital Stay (HOSPITAL_BASED_OUTPATIENT_CLINIC_OR_DEPARTMENT_OTHER): Payer: Medicare Other | Admitting: Internal Medicine

## 2017-08-06 VITALS — BP 153/86 | HR 99 | Temp 97.6°F | Resp 18 | Ht 60.0 in | Wt 121.2 lb

## 2017-08-06 DIAGNOSIS — E222 Syndrome of inappropriate secretion of antidiuretic hormone: Secondary | ICD-10-CM

## 2017-08-06 DIAGNOSIS — E871 Hypo-osmolality and hyponatremia: Secondary | ICD-10-CM | POA: Diagnosis not present

## 2017-08-06 DIAGNOSIS — I10 Essential (primary) hypertension: Secondary | ICD-10-CM

## 2017-08-06 DIAGNOSIS — K222 Esophageal obstruction: Secondary | ICD-10-CM

## 2017-08-06 DIAGNOSIS — R911 Solitary pulmonary nodule: Secondary | ICD-10-CM | POA: Diagnosis not present

## 2017-08-06 DIAGNOSIS — N179 Acute kidney failure, unspecified: Secondary | ICD-10-CM

## 2017-08-06 DIAGNOSIS — C155 Malignant neoplasm of lower third of esophagus: Secondary | ICD-10-CM

## 2017-08-06 DIAGNOSIS — R1310 Dysphagia, unspecified: Secondary | ICD-10-CM | POA: Diagnosis not present

## 2017-08-06 DIAGNOSIS — E876 Hypokalemia: Secondary | ICD-10-CM

## 2017-08-06 DIAGNOSIS — Z79899 Other long term (current) drug therapy: Secondary | ICD-10-CM

## 2017-08-06 DIAGNOSIS — Z87891 Personal history of nicotine dependence: Secondary | ICD-10-CM

## 2017-08-06 DIAGNOSIS — M810 Age-related osteoporosis without current pathological fracture: Secondary | ICD-10-CM

## 2017-08-06 DIAGNOSIS — Z9221 Personal history of antineoplastic chemotherapy: Secondary | ICD-10-CM

## 2017-08-06 DIAGNOSIS — N2889 Other specified disorders of kidney and ureter: Secondary | ICD-10-CM

## 2017-08-06 DIAGNOSIS — E039 Hypothyroidism, unspecified: Secondary | ICD-10-CM | POA: Diagnosis not present

## 2017-08-06 DIAGNOSIS — Z923 Personal history of irradiation: Secondary | ICD-10-CM

## 2017-08-06 LAB — BASIC METABOLIC PANEL
ANION GAP: 8 (ref 5–15)
BUN: 33 mg/dL — ABNORMAL HIGH (ref 6–20)
CALCIUM: 9.6 mg/dL (ref 8.9–10.3)
CO2: 27 mmol/L (ref 22–32)
Chloride: 102 mmol/L (ref 101–111)
Creatinine, Ser: 1.07 mg/dL — ABNORMAL HIGH (ref 0.44–1.00)
GFR, EST AFRICAN AMERICAN: 53 mL/min — AB (ref >60)
GFR, EST NON AFRICAN AMERICAN: 46 mL/min — AB (ref >60)
GLUCOSE: 157 mg/dL — AB (ref 65–99)
POTASSIUM: 4 mmol/L (ref 3.5–5.1)
Sodium: 137 mmol/L (ref 135–145)

## 2017-08-06 NOTE — Progress Notes (Signed)
[place] CONSULT NOTE  Patient Care Team: Crecencio Mc, MD as PCP - General (Internal Medicine) Clent Jacks, RN as Registered Nurse  CHIEF COMPLAINTS/PURPOSE OF CONSULTATION: Esophageal cancer  #  Oncology History   # MARCH 2018- ADENO CA; mod diff [lower third of esophagus; EGD Bx; Dr.Wohl]; CT- A/P- no distant mets/LN. PET- TxN1; no EUS.  # April 5th 2018- Carbo-taxol with RT [until May 22nd 2018]; July 12h PET-improved.    # Dysphagia- s/p EGD [Dr. Vicente Males; July 2018]; Repeat EGD [Dr.Wohl] s/p dilatation.  # March 2018- LEFT KIDNEY COMPLEX MASS ~2cm [incidental]; ~ 2 cm left lower lobe nodule [question second primary]  # Hx of Hyponatremia [~[phone]; Dr.Lateef]  # MOLECULAR TESTING- Her 2 neu-NEGATIVE; PDL-1- POSITIVE [CPS-2]; MMR- STABLE.      Malignant neoplasm of lower third of esophagus (HCC)     HISTORY OF PRESENTING ILLNESS:  Brittney Jackson 83 y.o.  female diagnosed adenocarcinoma the Lower esophagus currently s/p  chemoradiation is here for follow-up [finished RT on may 21st] Is here for follow-up.  In the interim patient was evaluated by nephrology for worsening renal function; recommended continued conservative measures.  Patient states that she has been drinking/and eating more since the dilatation of her Esophagus stricture.  She states that she has been taking boost up to 2 a day.  She is also been taking potassium pills once every other day.  Denies any shortness of breath or chest pain. No new cough. She is actually thinking of going back to the gym.  Patient notes to have some difficulty with vision blurriness. No double vision. She is awaiting ophthalmology evaluation in the end of the month.  ROS: A complete 10 point review of system is done which is negative except mentioned above in history of present illness  MEDICAL HISTORY:  Past Medical History:  Diagnosis Date  . Arthritis    left knee  . Cancer ([place])    esophageal  cancer  . Function kidney decreased   . Hypertension   . Hypothyroidism   . Inappropriate ADH secretion (HCC)   . Low sodium levels   . Osteoporosis   . Renal disorder     SURGICAL HISTORY: Past Surgical History:  Procedure Laterality Date  . CATARACT EXTRACTION W/PHACO Left 05/29/2015   Procedure: CATARACT EXTRACTION PHACO AND INTRAOCULAR LENS PLACEMENT (IOC);  Surgeon: Leandrew Koyanagi, MD;  Location: [facility];  Service: Ophthalmology;  Laterality: Left;  . CATARACT EXTRACTION W/PHACO Right 01/15/2016   Procedure: CATARACT EXTRACTION PHACO AND INTRAOCULAR LENS PLACEMENT (IOC);  Surgeon: Leandrew Koyanagi, MD;  Location: [place];  Service: Ophthalmology;  Laterality: Right;  [place]  . ESOPHAGOGASTRODUODENOSCOPY (EGD) WITH PROPOFOL N/A 03/16/2017   Procedure: ESOPHAGOGASTRODUODENOSCOPY (EGD) WITH PROPOFOL;  Surgeon: [name], MD;  Location: [facility] ENDOSCOPY;  Service: Endoscopy;  Laterality: N/A;  . ESOPHAGOGASTRODUODENOSCOPY (EGD) WITH PROPOFOL N/A 06/25/2017   Procedure: ESOPHAGOGASTRODUODENOSCOPY (EGD) WITH PROPOFOL;  Surgeon: Jonathon Bellows, MD;  [facility]: [facility] ENDOSCOPY;  Service: Endoscopy;  Laterality: N/A;  . ESOPHAGOGASTRODUODENOSCOPY (EGD) WITH PROPOFOL N/A 07/14/2017   Procedure: ESOPHAGOGASTRODUODENOSCOPY (EGD) WITH PROPOFOL with dilation;  Surgeon: [name], MD;  Location: [facility] ENDOSCOPY;  Service: Endoscopy;  Laterality: N/A;  . 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 Family is in the area.  Social History   Social History  . Marital status: Widowed    Spouse name: N/A  . Number of children: N/A  .  Years of education: N/A   Occupational History  . 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 Smoking status: Former Smoker    Types: Cigarettes    Quit date: 07/21/2000  . Smokeless tobacco: Never Used  . Alcohol use No  . Drug use: No   . Sexual activity: No   Other Topics Concern  . Not on file   Social History Narrative   Church activities, community service.   Lives alone.    FAMILY HISTORY: Family History  Problem Relation Age of Onset  . Heart attack Mother   . Cancer Maternal Uncle        renal cancer   . COPD Neg Hx        no breast, ovarian or colon    ALLERGIES:  is allergic to amlodipine.  MEDICATIONS:  Current Outpatient Prescriptions  Medication Sig Dispense Refill  . levothyroxine (SYNTHROID, LEVOTHROID) 88 MCG tablet TAKE ONE (1) TABLET EACH DAY 90 tablet 1  . potassium chloride SA (K-DUR,KLOR-CON) 20 MEQ tablet Take 1 tablet (20 mEq total) by mouth 2 (two) times daily. (Patient taking differently: Take 20 mEq by mouth every other day. ) 30 tablet 3  . HYDROcodone-acetaminophen (NORCO) 5-325 MG tablet Take 1 tablet by mouth every 6 (six) hours as needed for moderate pain. (Patient not taking: Reported on 07/16/2017) 60 tablet 0  . labetalol (NORMODYNE) 300 MG tablet Take 0.5 tablets (150 mg total) by mouth 3 (three) times daily. 1/2 tab (150 mg) Am and PM (Patient not taking: Reported on 07/16/2017) 270 tablet 1  . losartan (COZAAR) 100 MG tablet TAKE ONE (1) TABLET BY MOUTH EVERY DAY (Patient not taking: Reported on 07/16/2017) 90 tablet 1  . ondansetron (ZOFRAN-ODT) 4 MG disintegrating tablet Take 4 mg by mouth every 8 (eight) hours as needed.     . ranitidine (ZANTAC) 75 MG tablet Take 75 mg by mouth 2 (two) times daily.    . sucralfate (CARAFATE) 1 g tablet Take 1 tablet (1 g total) by mouth 4 (four) times daily -  with meals and at bedtime. Dissolve in warm water, swish and swallow (Patient not taking: Reported on 08/06/2017) 90 tablet 3   No current facility-administered medications for this visit.       Marland Kitchen  PHYSICAL EXAMINATION: ECOG PERFORMANCE STATUS: 0 - Asymptomatic  Vitals:   08/06/17 1057  BP: (!) 153/86  Pulse: 99  Resp: 18  Temp: 97.6 F (36.4 C)   Filed Weights    08/06/17 1057  Weight: 121 lb 3.2 oz (55 kg)    GENERAL: Well-nourished well-developed; Alert, no distress and comfortable.   Accompanied Son-in-law. EYES: no pallor or icterus OROPHARYNX: no thrush or ulceration; good dentition  NECK: supple, no masses felt LYMPH:  no palpable lymphadenopathy in the cervical, axillary or inguinal regions LUNGS: clear to auscultation and  No wheeze or crackles HEART/CVS: regular rate & rhythm and no murmurs; no swelling bilateral lower extremities ABDOMEN: abdomen soft, non-tender and normal bowel sounds Musculoskeletal:no cyanosis of digits and no clubbing  PSYCH: alert & oriented x 3 with fluent speech NEURO: no focal motor/sensory deficits SKIN:  no rashes or significant lesions  LABORATORY DATA:  I have reviewed the data as listed Lab Results  Component Value Date   WBC 6.0 07/16/2017   HGB 11.3 (L) 07/16/2017   HCT 32.1 (L) 07/16/2017   MCV 88.7 07/16/2017   PLT 418 07/16/2017  Recent Labs  06/13/17 1347  07/03/17 1532  07/05/17 1655  07/23/17 0856 07/30/17 0856 08/06/17 1038  NA 130*  < > 131*  < >  --   < > 137 139 137  K 3.8  < > 3.1*  < >  --   < > 4.0 4.0 4.0  CL 97*  < > 93*  < >  --   < > 100* 104 102  CO2 27  < > 29  < >  --   < > 28 25 27   GLUCOSE 123*  < > 120*  < >  --   < > 152* 147* 157*  BUN 29*  < > 16  < >  --   < > 37* 36* 33*  CREATININE 0.89  < > 0.90  < >  --   < > 1.35* 1.40* 1.07*  CALCIUM 9.1  < > 9.1  < >  --   < > 9.3 9.5 9.6  GFRNONAA 57*  < > 57*  < >  --   < > 35* 33* 46*  GFRAA >60  < > >60  < >  --   < > 40* 39* 53*  PROT 6.7  --  7.3  --  7.1  --   --   --   --   ALBUMIN 3.2*  --  3.5  --  3.3*  --   --   --   --   AST 24  --  37  --  25  --   --   --   --   ALT 16  --  24  --  20  --   --   --   --   ALKPHOS 95  --  108  --  110  --   --   --   --   BILITOT 0.6  --  0.6  --  0.6  --   --   --   --   BILIDIR  --   --  <0.1*  --  0.1  --   --   --   --   IBILI  --   --  NOT CALCULATED  --  0.5   --   --   --   --   < > = values in this interval not displayed.  RADIOGRAPHIC STUDIES: I have personally reviewed the radiological images as listed and agreed with the findings in the report. No results found.  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 PET- positive paraesophgeal uptake.Txn1- stage III. S/p  concurrent chemoradiation- carbo-taxol weekly [finished May 21st].   # PET scan- July 11- 2018- improved SUV uptake at the lower end of the esophagus; no evidence of any distant metastatic disease. Continue surveillance. We'll plan to get a repeat scan in approximately 3 months or so from the last scan/mid-November 2018. Discussed with the patient that further treatment options would be based upon the results of the PET scan. If no recurrence continue surveillance; however she has recurrence- immunotherapy might be a reasonable option. Patient is interested in treatments.  # Difficulty swallowing - second malignancy/radiation- status post dilatation. Improved. Patient also awaiting repeat EGD- with possible repeat dilatation.  # Acute renal failure- creatinine improved today at 1.0 [from previous 1.5]; stable sodium stable potassium. Would not need any IV fluids or potassium today.  # History  of hypertension- currently blood pressure is 130s to 140s as per patient; patient continues to be off her blood pressure medications given the recent acute renal failure. Recommend checking on a frequent basis; and if the systolic is 276-147 or above recommend restarting the blood pressure medications. She states to follow up with PCP  # Left lower lobe centimeter lung nodule- question etiology. slightly smaller. Monitor for now  # Left renal mass- suspicious for malignancy. Stable on the recent PET scan; await repeat PET scan is planned in mid November 2018. Monitor for now  # vision issues-recommend  follow up with opthalmology  # follow up in 1 month/labs.   cc; Dr.Lateef; Dr.Tullo     Cammie Sickle, MD 08/06/2017 1:28 PM

## 2017-08-06 NOTE — Assessment & Plan Note (Addendum)
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 PET- positive paraesophgeal uptake.Txn1- stage III. S/p  concurrent chemoradiation- carbo-taxol weekly [finished May 21st].   # PET scan- July 11- 2018- improved SUV uptake at the lower end of the esophagus; no evidence of any distant metastatic disease. Continue surveillance. We'll plan to get a repeat scan in approximately 3 months or so from the last scan/mid-November 2018. Discussed with the patient that further treatment options would be based upon the results of the PET scan. If no recurrence continue surveillance; however she has recurrence- immunotherapy might be a reasonable option. Patient is interested in treatments.  # Difficulty swallowing - second malignancy/radiation- status post dilatation. Improved. Patient also awaiting repeat EGD- with possible repeat dilatation.  # Acute renal failure- creatinine improved today at 1.0 [from previous 1.5]; stable sodium stable potassium. Would not need any IV fluids or potassium today.  # History of hypertension- currently blood pressure is 130s to 140s as per patient; patient continues to be off her blood pressure medications given the recent acute renal failure. Recommend checking on a frequent basis; and if the systolic is 630-160 or above recommend restarting the blood pressure medications. She states to follow up with PCP  # Left lower lobe centimeter lung nodule- question etiology. slightly smaller. Monitor for now  # Left renal mass- suspicious for malignancy. Stable on the recent PET scan; await repeat PET scan is planned in mid November 2018. Monitor for now  # vision issues-recommend follow up with opthalmology  # follow up in 1 month/labs.   cc; Dr.Lateef; Dr.Tullo

## 2017-08-06 NOTE — Progress Notes (Signed)
Patient here for follow-up for esophageal cancer. Pt states that her appetite has improved since stent placement. She is eating 3 meals a day. Denies any dysphagia.

## 2017-08-23 DIAGNOSIS — H353132 Nonexudative age-related macular degeneration, bilateral, intermediate dry stage: Secondary | ICD-10-CM | POA: Diagnosis not present

## 2017-08-23 DIAGNOSIS — H26492 Other secondary cataract, left eye: Secondary | ICD-10-CM | POA: Diagnosis not present

## 2017-08-26 ENCOUNTER — Other Ambulatory Visit: Payer: Self-pay

## 2017-08-26 ENCOUNTER — Telehealth: Payer: Self-pay | Admitting: Gastroenterology

## 2017-08-26 DIAGNOSIS — R1310 Dysphagia, unspecified: Secondary | ICD-10-CM

## 2017-08-26 DIAGNOSIS — R1319 Other dysphagia: Secondary | ICD-10-CM

## 2017-08-26 NOTE — Telephone Encounter (Signed)
Patient needs to schedule again. EGD? She already had one and now has the same symptoms. Please call today

## 2017-08-26 NOTE — Telephone Encounter (Signed)
Pt has been scheduled for an EGD at [facility] on 08/31/17.

## 2017-08-27 ENCOUNTER — Encounter: Payer: Self-pay | Admitting: *Deleted

## 2017-08-31 ENCOUNTER — Ambulatory Visit: Payer: Medicare Other | Admitting: Certified Registered Nurse Anesthetist

## 2017-08-31 ENCOUNTER — Telehealth: Payer: Self-pay | Admitting: Gastroenterology

## 2017-08-31 ENCOUNTER — Encounter
Admission: RE | Disposition: A | Discharge status: Home / Self Care | Payer: Self-pay | Source: Ambulatory Visit | Attending: Gastroenterology

## 2017-08-31 ENCOUNTER — Encounter: Payer: Self-pay | Admitting: Certified Registered Nurse Anesthetist

## 2017-08-31 ENCOUNTER — Ambulatory Visit
Admission: RE | Admit: 2017-08-31 | Discharge: 2017-08-31 | Disposition: A | Discharge status: Home / Self Care | Payer: Medicare Other | Source: Ambulatory Visit | Attending: Gastroenterology | Admitting: Gastroenterology

## 2017-08-31 DIAGNOSIS — N289 Disorder of kidney and ureter, unspecified: Secondary | ICD-10-CM | POA: Insufficient documentation

## 2017-08-31 DIAGNOSIS — Z9841 Cataract extraction status, right eye: Secondary | ICD-10-CM | POA: Diagnosis not present

## 2017-08-31 DIAGNOSIS — Z8501 Personal history of malignant neoplasm of esophagus: Secondary | ICD-10-CM | POA: Insufficient documentation

## 2017-08-31 DIAGNOSIS — Z8249 Family history of ischemic heart disease and other diseases of the circulatory system: Secondary | ICD-10-CM | POA: Diagnosis not present

## 2017-08-31 DIAGNOSIS — Z9842 Cataract extraction status, left eye: Secondary | ICD-10-CM | POA: Diagnosis not present

## 2017-08-31 DIAGNOSIS — E222 Syndrome of inappropriate secretion of antidiuretic hormone: Secondary | ICD-10-CM | POA: Insufficient documentation

## 2017-08-31 DIAGNOSIS — R1310 Dysphagia, unspecified: Secondary | ICD-10-CM | POA: Insufficient documentation

## 2017-08-31 DIAGNOSIS — Z825 Family history of asthma and other chronic lower respiratory diseases: Secondary | ICD-10-CM | POA: Diagnosis not present

## 2017-08-31 DIAGNOSIS — M810 Age-related osteoporosis without current pathological fracture: Secondary | ICD-10-CM | POA: Insufficient documentation

## 2017-08-31 DIAGNOSIS — Z79899 Other long term (current) drug therapy: Secondary | ICD-10-CM | POA: Insufficient documentation

## 2017-08-31 DIAGNOSIS — K222 Esophageal obstruction: Secondary | ICD-10-CM | POA: Insufficient documentation

## 2017-08-31 DIAGNOSIS — M1990 Unspecified osteoarthritis, unspecified site: Secondary | ICD-10-CM | POA: Diagnosis not present

## 2017-08-31 DIAGNOSIS — E039 Hypothyroidism, unspecified: Secondary | ICD-10-CM | POA: Diagnosis not present

## 2017-08-31 DIAGNOSIS — M1712 Unilateral primary osteoarthritis, left knee: Secondary | ICD-10-CM | POA: Diagnosis not present

## 2017-08-31 DIAGNOSIS — Z8051 Family history of malignant neoplasm of kidney: Secondary | ICD-10-CM | POA: Diagnosis not present

## 2017-08-31 DIAGNOSIS — I10 Essential (primary) hypertension: Secondary | ICD-10-CM | POA: Insufficient documentation

## 2017-08-31 DIAGNOSIS — Z87891 Personal history of nicotine dependence: Secondary | ICD-10-CM | POA: Insufficient documentation

## 2017-08-31 DIAGNOSIS — R1319 Other dysphagia: Secondary | ICD-10-CM

## 2017-08-31 HISTORY — PX: ESOPHAGOGASTRODUODENOSCOPY (EGD) WITH PROPOFOL: SHX5813

## 2017-08-31 SURGERY — ESOPHAGOGASTRODUODENOSCOPY (EGD) WITH PROPOFOL
Anesthesia: General

## 2017-08-31 MED ORDER — LIDOCAINE HCL (CARDIAC) 20 MG/ML IV SOLN
INTRAVENOUS | Status: DC | PRN
  Administered 2017-08-31: 40 mg via INTRAVENOUS

## 2017-08-31 MED ORDER — PROPOFOL 10 MG/ML IV BOLUS
INTRAVENOUS | Status: DC | PRN
  Administered 2017-08-31: 20 mg via INTRAVENOUS
  Administered 2017-08-31: 10 mg via INTRAVENOUS

## 2017-08-31 MED ORDER — PROPOFOL 10 MG/ML IV BOLUS
INTRAVENOUS | Status: AC
  Filled 2017-08-31: qty 20

## 2017-08-31 MED ORDER — LIDOCAINE HCL (PF) 1 % IJ SOLN
2.0000 mL | Freq: Once | INTRAMUSCULAR | Status: AC
  Administered 2017-08-31: 0.3 mL via INTRADERMAL

## 2017-08-31 MED ORDER — SODIUM CHLORIDE 0.9 % IV SOLN
INTRAVENOUS | Status: DC
  Administered 2017-08-31: 1000 mL via INTRAVENOUS

## 2017-08-31 MED ORDER — LIDOCAINE HCL (PF) 2 % IJ SOLN
INTRAMUSCULAR | Status: AC
  Filled 2017-08-31: qty 2

## 2017-08-31 MED ORDER — LIDOCAINE HCL (PF) 1 % IJ SOLN
INTRAMUSCULAR | Status: AC
  Administered 2017-08-31: 0.3 mL via INTRADERMAL
  Filled 2017-08-31: qty 2

## 2017-08-31 MED ORDER — PROPOFOL 500 MG/50ML IV EMUL
INTRAVENOUS | Status: DC | PRN
  Administered 2017-08-31: 150 ug/kg/min via INTRAVENOUS

## 2017-08-31 NOTE — Transfer of Care (Signed)
Immediate Anesthesia Transfer of Care Note  Patient: Brittney Jackson  Procedure(s) Performed: Procedure(s): ESOPHAGOGASTRODUODENOSCOPY (EGD) WITH PROPOFOL (N/A)  Patient Location: PACU  Anesthesia Type:General  Level of Consciousness: sedated  Airway & Oxygen Therapy: Patient Spontanous Breathing and Patient connected to nasal cannula oxygen  Post-op Assessment: Report given to RN and Post -op Vital signs reviewed and stable  Post vital signs: Reviewed and stable  Last Vitals:  Vitals:   08/31/17 0845  BP: (!) 183/78  Pulse: 96  Resp: 17  Temp: (!) 35.7 C  SpO2: 98%    Last Pain:  Vitals:   08/31/17 0845  TempSrc: Tympanic         Complications: No apparent anesthesia complications

## 2017-08-31 NOTE — Anesthesia Preprocedure Evaluation (Signed)
Anesthesia Evaluation  Patient identified by MRN, date of birth, ID band Patient awake    Reviewed: Allergy & Precautions, H&P , NPO status , Patient's Chart, lab work & pertinent test results  History of Anesthesia Complications Negative for: history of anesthetic complications  Airway Mallampati: III  TM Distance: <3 FB Neck ROM: limited    Dental  (+) Poor Dentition, Chipped, Caps   Pulmonary neg shortness of breath, neg recent URI, former smoker,           Cardiovascular Exercise Tolerance: Good hypertension, (-) angina(-) CAD, (-) Past MI, (-) Cardiac Stents, (-) CABG and (-) DOE (-) dysrhythmias (-) Valvular Problems/Murmurs     Neuro/Psych negative neurological ROS  negative psych ROS   GI/Hepatic negative GI ROS, Neg liver ROS, neg GERD  ,  Endo/Other  neg diabetesHypothyroidism   Renal/GU Renal disease  negative genitourinary   Musculoskeletal  (+) Arthritis ,   Abdominal   Peds  Hematology negative hematology ROS (+)   Anesthesia Other Findings Past Medical History: No date: Arthritis     Comment:  left knee No date: Cancer ([place])     Comment:  esophageal cancer No date: Function kidney decreased No date: Hypertension No date: Hypothyroidism No date: Inappropriate ADH secretion (HCC) No date: Low sodium levels No date: Osteoporosis No date: Renal disorder  Past Surgical History: 05/29/2015: CATARACT EXTRACTION W/PHACO; Left     Comment:  Procedure: CATARACT EXTRACTION PHACO AND INTRAOCULAR               LENS PLACEMENT (IOC);  Surgeon: Leandrew Koyanagi, MD;               Location: [place];  Service: Ophthalmology;                Laterality: Left; 01/15/2016: CATARACT EXTRACTION W/PHACO; Right     Comment:  Procedure: CATARACT EXTRACTION PHACO AND INTRAOCULAR               LENS PLACEMENT (IOC);  Surgeon: Leandrew Koyanagi, MD;               Location: [employer];  Service:  Ophthalmology;                Laterality: Right;  [place] 03/16/2017: ESOPHAGOGASTRODUODENOSCOPY (EGD) WITH PROPOFOL; N/A     Comment:  Procedure: ESOPHAGOGASTRODUODENOSCOPY (EGD) WITH               PROPOFOL;  Surgeon: [name], MD;  Location: [facility]               ENDOSCOPY;  Service: Endoscopy;  Laterality: N/A; 06/25/2017: ESOPHAGOGASTRODUODENOSCOPY (EGD) WITH PROPOFOL; N/A     Comment:  Procedure: ESOPHAGOGASTRODUODENOSCOPY (EGD) WITH               PROPOFOL;  Surgeon: Jonathon Bellows, MD;  Location: [facility]               ENDOSCOPY;  Service: Endoscopy;  Laterality: N/A; No date: TONSILLECTOMY     Reproductive/Obstetrics negative OB ROS                             Anesthesia Physical  Anesthesia Plan  ASA: III  Anesthesia Plan: General   Post-op Pain Management:    Induction: Intravenous  PONV Risk Score and Plan: 3 and Propofol infusion  Airway Management Planned: Natural Airway and Nasal Cannula  Additional Equipment:   Intra-op Plan:  Post-operative Plan:   Informed Consent: I have reviewed the patients History and Physical, chart, labs and discussed the procedure including the risks, benefits and alternatives for the proposed anesthesia with the patient or authorized representative who has indicated his/her understanding and acceptance.   Dental Advisory Given  Plan Discussed with: Anesthesiologist, CRNA and Surgeon  Anesthesia Plan Comments: (Plan to re check serum K before we proceed  Patient consented for risks of anesthesia including but not limited to:  - adverse reactions to medications - risk of intubation if required - damage to teeth, lips or other oral mucosa - sore throat or hoarseness - Damage to heart, brain, lungs or loss of life  Patient voiced understanding.)        Anesthesia Quick Evaluation

## 2017-08-31 NOTE — Op Note (Signed)
[facility] Gastroenterology Patient Name: Brittney Jackson Procedure Date: 08/31/2017 9:31 AM MRN: [id number] Account #: [id number] Date of Birth: [birth year] Admit Type: Outpatient Age: 83 Room: [facility] ENDO ROOM 4 Gender: Female Note Status: Finalized Procedure:            Upper GI endoscopy Indications:          Dysphagia Providers:            [name] MD, MD Referring MD:         Deborra Medina, MD (Referring MD) Medicines:            Propofol per Anesthesia Complications:        No immediate complications. Procedure:            Pre-Anesthesia Assessment:                       - Prior to the procedure, a History and Physical was                        performed, and patient medications and allergies were                        reviewed. The patient's tolerance of previous                        anesthesia was also reviewed. The risks and benefits of                        the procedure and the sedation options and risks were                        discussed with the patient. All questions were                        answered, and informed consent was obtained. Prior                        Anticoagulants: The patient has taken no previous                        anticoagulant or antiplatelet agents. ASA Grade                        Assessment: II - A patient with mild systemic disease.                        After reviewing the risks and benefits, the patient was                        deemed in satisfactory condition to undergo the                        procedure.                       After obtaining informed consent, the endoscope was                        passed under direct vision. Throughout the procedure,  the patient's blood pressure, pulse, and oxygen                        saturations were monitored continuously. The Endoscope                        was introduced through the mouth, and advanced to the                        second  part of duodenum. The upper GI endoscopy was                        accomplished without difficulty. The patient tolerated                        the procedure well. Findings:      One severe benign-appearing, intrinsic stenosis was found at the       gastroesophageal junction. This measured 8 mm (inner diameter) and was       traversed after dilation. A TTS dilator was passed through the scope.       Dilation with a 10-08-11 mm balloon and a 12-13.5-15 mm balloon dilator       was performed to 13.5 mm. The dilation site was examined following       endoscope reinsertion and showed moderate improvement in luminal       narrowing.      The stomach was normal.      The examined duodenum was normal. Impression:           - Benign-appearing esophageal stenosis. Dilated.                       - Normal stomach.                       - Normal examined duodenum.                       - No specimens collected. Recommendation:       - Discharge patient to home.                       - Resume previous diet.                       - Continue present medications.                       - Repeat upper endoscopy PRN for retreatment. Procedure Code(s):    --- Professional ---                       [phone], Esophagogastroduodenoscopy, flexible, transoral;                        with transendoscopic balloon dilation of esophagus                        (less than 30 mm diameter) Diagnosis Code(s):    --- Professional ---                       R13.10, Dysphagia, unspecified  K22.2, Esophageal obstruction CPT copyright 2016 American Medical Association. All rights reserved. The codes documented in this report are preliminary and upon coder review may  be revised to meet current compliance requirements. [name] MD, MD 08/31/2017 9:54:07 AM This report has been signed electronically. Number of Addenda: 0 Note Initiated On: 08/31/2017 9:31 AM      [facility]

## 2017-08-31 NOTE — Telephone Encounter (Signed)
Patient needs to schedule another EDG.

## 2017-08-31 NOTE — H&P (Signed)
[name], MD [address] [place], [place] [address] [phone] Fax : [phone]  Primary Care Physician:  Crecencio Mc, MD Primary Gastroenterologist:  Dr. Allen Norris  Pre-Procedure History & Physical: HPI:  Brittney Jackson is a 83 y.o. female is here for an endoscopy.   Past Medical History:  Diagnosis Date  . Arthritis    left knee  . Cancer ([place])    esophageal cancer  . Function kidney decreased   . Hypertension   . Hypothyroidism   . Inappropriate ADH secretion (HCC)   . Low sodium levels   . Osteoporosis   . Renal disorder     Past Surgical History:  Procedure Laterality Date  . CATARACT EXTRACTION W/PHACO Left 05/29/2015   Procedure: CATARACT EXTRACTION PHACO AND INTRAOCULAR LENS PLACEMENT (IOC);  Surgeon: Leandrew Koyanagi, MD;  Location: [place];  Service: Ophthalmology;  Laterality: Left;  . CATARACT EXTRACTION W/PHACO Right 01/15/2016   Procedure: CATARACT EXTRACTION PHACO AND INTRAOCULAR LENS PLACEMENT (IOC);  Surgeon: Leandrew Koyanagi, MD;  Location: [place];  Service: Ophthalmology;  Laterality: Right;  SHUGARCAINE  . ESOPHAGOGASTRODUODENOSCOPY (EGD) WITH PROPOFOL N/A 03/16/2017   Procedure: ESOPHAGOGASTRODUODENOSCOPY (EGD) WITH PROPOFOL;  Surgeon: [name], MD;  Location: [facility] ENDOSCOPY;  Service: Endoscopy;  Laterality: N/A;  . ESOPHAGOGASTRODUODENOSCOPY (EGD) WITH PROPOFOL N/A 06/25/2017   Procedure: ESOPHAGOGASTRODUODENOSCOPY (EGD) WITH PROPOFOL;  Surgeon: Jonathon Bellows, MD;  Location: [facility] ENDOSCOPY;  Service: Endoscopy;  Laterality: N/A;  . ESOPHAGOGASTRODUODENOSCOPY (EGD) WITH PROPOFOL N/A 07/14/2017   Procedure: ESOPHAGOGASTRODUODENOSCOPY (EGD) WITH PROPOFOL with dilation;  Surgeon: [name], MD;  Location: [facility] ENDOSCOPY;  Service: Endoscopy;  Laterality: N/A;  . TONSILLECTOMY      Prior to Admission medications   Medication Sig Start Date End Date Taking? Authorizing Provider    HYDROcodone-acetaminophen (NORCO) 5-325 MG tablet Take 1 tablet by mouth every 6 (six) hours as needed for moderate pain. Patient not taking: Reported on 07/16/2017 06/11/17   Sindy Guadeloupe, MD  labetalol (NORMODYNE) 300 MG tablet Take 0.5 tablets (150 mg total) by mouth 3 (three) times daily. 1/2 tab (150 mg) Am and PM Patient not taking: Reported on 07/16/2017 02/28/16   Crecencio Mc, MD  levothyroxine (SYNTHROID, LEVOTHROID) 88 MCG tablet TAKE ONE (1) TABLET EACH DAY 05/19/17   Crecencio Mc, MD  losartan (COZAAR) 100 MG tablet TAKE ONE (1) TABLET BY MOUTH EVERY DAY Patient not taking: Reported on 07/16/2017 12/23/16   Crecencio Mc, MD  ondansetron (ZOFRAN-ODT) 4 MG disintegrating tablet Take 4 mg by mouth every 8 (eight) hours as needed.  06/18/17   [provider]  potassium chloride SA (K-DUR,KLOR-CON) 20 MEQ tablet Take 1 tablet (20 mEq total) by mouth 2 (two) times daily. Patient taking differently: Take 20 mEq by mouth every other day.  07/09/17   Cammie Sickle, MD  ranitidine (ZANTAC) 75 MG tablet Take 75 mg by mouth 2 (two) times daily.    [provider]  sucralfate (CARAFATE) 1 g tablet Take 1 tablet (1 g total) by mouth 4 (four) times daily -  with meals and at bedtime. Dissolve in warm water, swish and swallow Patient not taking: Reported on 08/06/2017 04/06/17   Noreene Filbert, MD    Allergies as of 08/26/2017 - Review Complete 08/06/2017  Allergen Reaction Noted  . Amlodipine Swelling 01/09/2014    Family History  Problem Relation Age of Onset  . Heart attack Mother   . Cancer Maternal Uncle  renal cancer   . COPD Neg Hx        no breast, ovarian or colon    Social History   Social History  . Marital status: Widowed    Spouse name: N/A  . Number of children: N/A  . Years of education: N/A   Occupational History  . 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 Smoking status: Former  Smoker    Types: Cigarettes    Quit date: 07/21/2000  . Smokeless tobacco: Never Used  . Alcohol use No  . Drug use: No  . Sexual activity: No   Other Topics Concern  . Not on file   Social History Narrative   Church activities, community service.   Lives alone.    Review of Systems: See HPI, otherwise negative ROS  Physical Exam: BP (!) 183/78   Pulse 96   Temp (!) 96.2 F (35.7 C) (Tympanic)   Resp 17   Ht 5' (1.524 m)   Wt 125 lb (56.7 kg)   SpO2 98%   BMI 24.41 kg/m  General:   Alert,  pleasant and cooperative in NAD Head:  Normocephalic and atraumatic. Neck:  Supple; no masses or thyromegaly. Lungs:  Clear throughout to auscultation.    Heart:  Regular rate and rhythm. Abdomen:  Soft, nontender and nondistended. Normal bowel sounds, without guarding, and without rebound.   Neurologic:  Alert and  oriented x4;  grossly normal neurologically.  Impression/Plan: Brittney Jackson is here for an endoscopy to be performed for dysphagia  Risks, benefits, limitations, and alternatives regarding  endoscopy have been reviewed with the patient.  Questions have been answered.  All parties agreeable.   [name], MD  08/31/2017, 9:55 AM

## 2017-08-31 NOTE — Anesthesia Postprocedure Evaluation (Signed)
Anesthesia Post Note  Patient: Brittney Jackson  Procedure(s) Performed: Procedure(s) (LRB): ESOPHAGOGASTRODUODENOSCOPY (EGD) WITH PROPOFOL (N/A)  Patient location during evaluation: Endoscopy Anesthesia Type: General Level of consciousness: awake and alert Pain management: pain level controlled Vital Signs Assessment: post-procedure vital signs reviewed and stable Respiratory status: spontaneous breathing, nonlabored ventilation, respiratory function stable and patient connected to nasal cannula oxygen Cardiovascular status: blood pressure returned to baseline and stable Postop Assessment: no signs of nausea or vomiting Anesthetic complications: no     Last Vitals:  Vitals:   08/31/17 1017 08/31/17 1027  BP: (!) 203/78 (!) 193/83  Pulse: 75 77  Resp: 17 (!) 26  Temp:    SpO2: 100% 99%    Last Pain:  Vitals:   08/31/17 0957  TempSrc: Tympanic                 Martha Clan

## 2017-08-31 NOTE — Anesthesia Procedure Notes (Signed)
Date/Time: 08/31/2017 9:42 AM Performed by: Johnna Acosta Pre-anesthesia Checklist: Patient identified, Emergency Drugs available, Suction available, Patient being monitored and Timeout performed Patient Re-evaluated:Patient Re-evaluated prior to induction Oxygen Delivery Method: Nasal cannula

## 2017-08-31 NOTE — Anesthesia Post-op Follow-up Note (Signed)
Anesthesia QCDR form completed.        

## 2017-09-01 ENCOUNTER — Encounter: Payer: Self-pay | Admitting: Gastroenterology

## 2017-09-01 NOTE — Telephone Encounter (Signed)
Patient needs to schedule another EGD on the 21st

## 2017-09-02 ENCOUNTER — Other Ambulatory Visit: Payer: Self-pay

## 2017-09-02 DIAGNOSIS — N183 Chronic kidney disease, stage 3 (moderate): Secondary | ICD-10-CM | POA: Diagnosis not present

## 2017-09-02 DIAGNOSIS — I10 Essential (primary) hypertension: Secondary | ICD-10-CM | POA: Diagnosis not present

## 2017-09-02 DIAGNOSIS — E871 Hypo-osmolality and hyponatremia: Secondary | ICD-10-CM | POA: Diagnosis not present

## 2017-09-02 DIAGNOSIS — R1310 Dysphagia, unspecified: Secondary | ICD-10-CM

## 2017-09-02 DIAGNOSIS — N2581 Secondary hyperparathyroidism of renal origin: Secondary | ICD-10-CM | POA: Diagnosis not present

## 2017-09-02 DIAGNOSIS — R1319 Other dysphagia: Secondary | ICD-10-CM

## 2017-09-02 NOTE — Telephone Encounter (Signed)
Pt rescheduled for a repeat EGD at [facility] with [place] on 09/21/17. Instructions mailed.

## 2017-09-03 ENCOUNTER — Inpatient Hospital Stay: Payer: Medicare Other | Attending: Internal Medicine | Admitting: Internal Medicine

## 2017-09-03 ENCOUNTER — Inpatient Hospital Stay: Payer: Medicare Other

## 2017-09-03 ENCOUNTER — Ambulatory Visit: Payer: Medicare Other | Admitting: Internal Medicine

## 2017-09-03 VITALS — BP 168/96 | HR 86 | Temp 96.3°F | Resp 20 | Ht 60.0 in | Wt 124.0 lb

## 2017-09-03 DIAGNOSIS — Z9221 Personal history of antineoplastic chemotherapy: Secondary | ICD-10-CM | POA: Insufficient documentation

## 2017-09-03 DIAGNOSIS — N179 Acute kidney failure, unspecified: Secondary | ICD-10-CM | POA: Insufficient documentation

## 2017-09-03 DIAGNOSIS — M1712 Unilateral primary osteoarthritis, left knee: Secondary | ICD-10-CM | POA: Diagnosis not present

## 2017-09-03 DIAGNOSIS — I10 Essential (primary) hypertension: Secondary | ICD-10-CM

## 2017-09-03 DIAGNOSIS — Z87891 Personal history of nicotine dependence: Secondary | ICD-10-CM

## 2017-09-03 DIAGNOSIS — R911 Solitary pulmonary nodule: Secondary | ICD-10-CM | POA: Diagnosis not present

## 2017-09-03 DIAGNOSIS — R1310 Dysphagia, unspecified: Secondary | ICD-10-CM | POA: Insufficient documentation

## 2017-09-03 DIAGNOSIS — E222 Syndrome of inappropriate secretion of antidiuretic hormone: Secondary | ICD-10-CM | POA: Diagnosis not present

## 2017-09-03 DIAGNOSIS — E871 Hypo-osmolality and hyponatremia: Secondary | ICD-10-CM

## 2017-09-03 DIAGNOSIS — Z79899 Other long term (current) drug therapy: Secondary | ICD-10-CM | POA: Insufficient documentation

## 2017-09-03 DIAGNOSIS — N2889 Other specified disorders of kidney and ureter: Secondary | ICD-10-CM | POA: Diagnosis not present

## 2017-09-03 DIAGNOSIS — C155 Malignant neoplasm of lower third of esophagus: Secondary | ICD-10-CM

## 2017-09-03 DIAGNOSIS — E039 Hypothyroidism, unspecified: Secondary | ICD-10-CM | POA: Diagnosis not present

## 2017-09-03 DIAGNOSIS — M810 Age-related osteoporosis without current pathological fracture: Secondary | ICD-10-CM | POA: Diagnosis not present

## 2017-09-03 DIAGNOSIS — Z923 Personal history of irradiation: Secondary | ICD-10-CM | POA: Insufficient documentation

## 2017-09-03 DIAGNOSIS — Z8501 Personal history of malignant neoplasm of esophagus: Secondary | ICD-10-CM | POA: Diagnosis not present

## 2017-09-03 LAB — MAGNESIUM: MAGNESIUM: 1.8 mg/dL (ref 1.7–2.4)

## 2017-09-03 LAB — CBC WITH DIFFERENTIAL/PLATELET
Basophils Absolute: 0.1 10*3/uL (ref 0–0.1)
Basophils Relative: 1 %
EOS ABS: 0.1 10*3/uL (ref 0–0.7)
EOS PCT: 2 %
HCT: 36.4 % (ref 35.0–47.0)
Hemoglobin: 12.8 g/dL (ref 12.0–16.0)
LYMPHS ABS: 1.3 10*3/uL (ref 1.0–3.6)
Lymphocytes Relative: 27 %
MCH: 30.9 pg (ref 26.0–34.0)
MCHC: 35 g/dL (ref 32.0–36.0)
MCV: 88.3 fL (ref 80.0–100.0)
MONO ABS: 0.8 10*3/uL (ref 0.2–0.9)
MONOS PCT: 16 %
Neutro Abs: 2.6 10*3/uL (ref 1.4–6.5)
Neutrophils Relative %: 54 %
PLATELETS: 264 10*3/uL (ref 150–440)
RBC: 4.12 MIL/uL (ref 3.80–5.20)
RDW: 14.4 % (ref 11.5–14.5)
WBC: 4.9 10*3/uL (ref 3.6–11.0)

## 2017-09-03 LAB — BASIC METABOLIC PANEL
Anion gap: 9 (ref 5–15)
BUN: 31 mg/dL — ABNORMAL HIGH (ref 6–20)
CO2: 26 mmol/L (ref 22–32)
CREATININE: 1.08 mg/dL — AB (ref 0.44–1.00)
Calcium: 9.4 mg/dL (ref 8.9–10.3)
Chloride: 98 mmol/L — ABNORMAL LOW (ref 101–111)
GFR calc Af Amer: 53 mL/min — ABNORMAL LOW (ref >60)
GFR, EST NON AFRICAN AMERICAN: 45 mL/min — AB (ref >60)
Glucose, Bld: 108 mg/dL — ABNORMAL HIGH (ref 65–99)
Potassium: 4 mmol/L (ref 3.5–5.1)
SODIUM: 133 mmol/L — AB (ref 135–145)

## 2017-09-03 NOTE — Progress Notes (Signed)
Patient requests that her lab work be fwd to Dr. Anthonette Legato.

## 2017-09-03 NOTE — Assessment & Plan Note (Addendum)
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 PET- positive paraesophgeal uptake.Txn1- stage III. S/p  concurrent chemoradiation- carbo-taxol weekly [finished May 21st].   # PET scan- July 11- 2018- improved SUV uptake at the lower end of the esophagus; no evidence of any distant metastatic disease. Endoscopy 08/30/2017 -no morphological evidence of recurrence. Continue surveillance.   # Difficulty swallowing - second malignancy/radiation- status post dilatation x2- slightly  Improved. Again awaiting in end of sep 2018.   # Acute renal failure- creatinine improved today at 1.0 [from previous 1.5]; sodium- 133/improved.  No fluids needed.   # History of hypertension- elevated in 168/90s; at home currently blood pressure is 130s to 140s as per patient;  She states to follow up with PCP/Dr.Tullo,   # Left lower lobe centimeter lung nodule- question etiology. slightly smaller. Monitor for now  # Left renal mass- suspicious for malignancy. Stable on the recent PET scan; await repeat PET scan is planned in mid November 2018. Monitor for now  # vision issues-recommend follow up with opthalmology  # follow up in 2 months/ labs/PET scan prior.  cc; Dr.Lateef; Dr.Tullo; Dr. Allen Norris

## 2017-09-03 NOTE — Progress Notes (Signed)
[facility] NOTE  Patient Care Team: Crecencio Mc, MD as PCP - General (Internal Medicine) Clent Jacks, RN as Registered Nurse Anthonette Legato, MD (Internal Medicine)  CHIEF COMPLAINTS/PURPOSE OF CONSULTATION: Esophageal cancer  #  Oncology History   # MARCH 2018- ADENO CA; mod diff [lower third of esophagus; EGD Bx; Dr.Wohl]; CT- A/P- no distant mets/LN. PET- TxN1; no EUS.  # April 5th 2018- Carbo-taxol with RT [until May 22nd 2018]; July 12h PET-improved.    # Dysphagia- s/p EGD [Dr. Vicente Males; July 2018]; Repeat EGD [Dr.Wohl] s/p dilatation.  # March 2018- LEFT KIDNEY COMPLEX MASS ~2cm [incidental]; ~ 2 cm left lower lobe nodule [question second primary]  # Hx of Hyponatremia [[phone]; Dr.Lateef]  # MOLECULAR TESTING- Her 2 neu-NEGATIVE; PDL-1- POSITIVE [CPS-2]; MMR- STABLE.      Malignant neoplasm of lower third of esophagus (HCC)     HISTORY OF PRESENTING ILLNESS:  Brittney Jackson 83 y.o.  female diagnosed adenocarcinoma the Lower esophagus currently s/p  chemoradiation is here for follow-up [finished RT on may 21st] Is here for follow-up.  In the interim patient underwent endoscopy-dilatation for difficult swallowing. Review of endoscopy report- no obvious evidence of recurrence. No biopsy taken.  She feels her swallowing is improved since dilatation;  She states that she has been taking boost up to 2 a day.  Denies any shortness of breath or chest pain. No new cough.  Patient notes to have some difficulty with vision blurriness. No double vision. She is awaiting ophthalmology evaluation for possible cataracts.  ROS: A complete 10 point review of system is done which is negative except mentioned above in history of present illness  MEDICAL HISTORY:  Past Medical History:  Diagnosis Date  . Arthritis    left knee  . Cancer ([place])    esophageal cancer  . Function kidney decreased   . Hypertension   . Hypothyroidism   . Inappropriate ADH  secretion (HCC)   . Low sodium levels   . Osteoporosis   . Renal disorder     SURGICAL HISTORY: Past Surgical History:  Procedure Laterality Date  . CATARACT EXTRACTION W/PHACO Left 05/29/2015   Procedure: CATARACT EXTRACTION PHACO AND INTRAOCULAR LENS PLACEMENT (IOC);  Surgeon: Leandrew Koyanagi, MD;  Location: [place];  Service: Ophthalmology;  Laterality: Left;  . CATARACT EXTRACTION W/PHACO Right 01/15/2016   Procedure: CATARACT EXTRACTION PHACO AND INTRAOCULAR LENS PLACEMENT (IOC);  Surgeon: Leandrew Koyanagi, MD;  Location: [place];  Service: Ophthalmology;  Laterality: Right;  SHUGARCAINE  . ESOPHAGOGASTRODUODENOSCOPY (EGD) WITH PROPOFOL N/A 03/16/2017   Procedure: ESOPHAGOGASTRODUODENOSCOPY (EGD) WITH PROPOFOL;  Surgeon: [name], MD;  Location: [facility] ENDOSCOPY;  Service: Endoscopy;  Laterality: N/A;  . ESOPHAGOGASTRODUODENOSCOPY (EGD) WITH PROPOFOL N/A 06/25/2017   Procedure: ESOPHAGOGASTRODUODENOSCOPY (EGD) WITH PROPOFOL;  Surgeon: Jonathon Bellows, MD;  Location: [facility] ENDOSCOPY;  Service: Endoscopy;  Laterality: N/A;  . ESOPHAGOGASTRODUODENOSCOPY (EGD) WITH PROPOFOL N/A 07/14/2017   Procedure: ESOPHAGOGASTRODUODENOSCOPY (EGD) WITH PROPOFOL with dilation;  Surgeon: [name], MD;  Location: [facility] ENDOSCOPY;  Service: Endoscopy;  Laterality: N/A;  . ESOPHAGOGASTRODUODENOSCOPY (EGD) WITH PROPOFOL N/A 08/31/2017   Procedure: ESOPHAGOGASTRODUODENOSCOPY (EGD) WITH PROPOFOL;  Surgeon: [name], MD;  Location: [facility] ENDOSCOPY;  Service: Endoscopy;  Laterality: N/A;  . 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 Family is in the area.  Social History   Social History  . Marital status: Widowed    Spouse name: N/A  .  Number of children: N/A  . Years of education: N/A   Occupational History  . 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 Smoking status: Former  Smoker    Types: Cigarettes    Quit date: 07/21/2000  . Smokeless tobacco: Never Used  . Alcohol use No  . Drug use: No  . Sexual activity: No   Other Topics Concern  . Not on file   Social History Narrative   Church activities, community service.   Lives alone.    FAMILY HISTORY: Family History  Problem Relation Age of Onset  . Heart attack Mother   . Cancer Maternal Uncle        renal cancer   . COPD Neg Hx        no breast, ovarian or colon    ALLERGIES:  is allergic to amlodipine.  MEDICATIONS:  Current Outpatient Prescriptions  Medication Sig Dispense Refill  . levothyroxine (SYNTHROID, LEVOTHROID) 88 MCG tablet TAKE ONE (1) TABLET EACH DAY 90 tablet 1  . omeprazole (PRILOSEC OTC) 20 MG tablet Take 20 mg by mouth daily. Chew tablets    . potassium chloride SA (K-DUR,KLOR-CON) 20 MEQ tablet Take 1 tablet (20 mEq total) by mouth 2 (two) times daily. (Patient taking differently: Take 20 mEq by mouth every other day. ) 30 tablet 3  . HYDROcodone-acetaminophen (NORCO) 5-325 MG tablet Take 1 tablet by mouth every 6 (six) hours as needed for moderate pain. (Patient not taking: Reported on 07/16/2017) 60 tablet 0  . labetalol (NORMODYNE) 300 MG tablet Take 0.5 tablets (150 mg total) by mouth 3 (three) times daily. 1/2 tab (150 mg) Am and PM (Patient not taking: Reported on 07/16/2017) 270 tablet 1  . losartan (COZAAR) 100 MG tablet TAKE ONE (1) TABLET BY MOUTH EVERY DAY (Patient not taking: Reported on 07/16/2017) 90 tablet 1  . ondansetron (ZOFRAN-ODT) 4 MG disintegrating tablet Take 4 mg by mouth every 8 (eight) hours as needed.     . ranitidine (ZANTAC) 75 MG tablet Take 75 mg by mouth 2 (two) times daily.    . sucralfate (CARAFATE) 1 g tablet Take 1 tablet (1 g total) by mouth 4 (four) times daily -  with meals and at bedtime. Dissolve in warm water, swish and swallow (Patient not taking: Reported on 08/06/2017) 90 tablet 3   No current facility-administered medications for this  visit.       Marland Kitchen  PHYSICAL EXAMINATION: ECOG PERFORMANCE STATUS: 0 - Asymptomatic  Vitals:   09/03/17 1015  BP: (!) 168/96  Pulse: 86  Resp: 20  Temp: (!) 96.3 F (35.7 C)   Filed Weights   09/03/17 1015  Weight: 124 lb (56.2 kg)    GENERAL: Well-nourished well-developed; Alert, no distress and comfortable. She is alone. EYES: no pallor or icterus OROPHARYNX: no thrush or ulceration; good dentition  NECK: supple, no masses felt LYMPH:  no palpable lymphadenopathy in the cervical, axillary or inguinal regions LUNGS: clear to auscultation and  No wheeze or crackles HEART/CVS: regular rate & rhythm and no murmurs; no swelling bilateral lower extremities ABDOMEN: abdomen soft, non-tender and normal bowel sounds Musculoskeletal:no cyanosis of digits and no clubbing  PSYCH: alert & oriented x 3 with fluent speech NEURO: no focal motor/sensory deficits SKIN:  no rashes or significant lesions  LABORATORY DATA:  I have reviewed the data as listed Lab Results  Component Value Date   WBC 4.9 09/03/2017   HGB  12.8 09/03/2017   HCT 36.4 09/03/2017   MCV 88.3 09/03/2017   PLT 264 09/03/2017    Recent Labs  06/13/17 1347  07/03/17 1532  07/05/17 1655  07/30/17 0856 08/06/17 1038 09/03/17 0936  NA 130*  < > 131*  < >  --   < > 139 137 133*  K 3.8  < > 3.1*  < >  --   < > 4.0 4.0 4.0  CL 97*  < > 93*  < >  --   < > 104 102 98*  CO2 27  < > 29  < >  --   < > 25 27 26   GLUCOSE 123*  < > 120*  < >  --   < > 147* 157* 108*  BUN 29*  < > 16  < >  --   < > 36* 33* 31*  CREATININE 0.89  < > 0.90  < >  --   < > 1.40* 1.07* 1.08*  CALCIUM 9.1  < > 9.1  < >  --   < > 9.5 9.6 9.4  GFRNONAA 57*  < > 57*  < >  --   < > 33* 46* 45*  GFRAA >60  < > >60  < >  --   < > 39* 53* 53*  PROT 6.7  --  7.3  --  7.1  --   --   --   --   ALBUMIN 3.2*  --  3.5  --  3.3*  --   --   --   --   AST 24  --  37  --  25  --   --   --   --   ALT 16  --  24  --  20  --   --   --   --   ALKPHOS 95  --   108  --  110  --   --   --   --   BILITOT 0.6  --  0.6  --  0.6  --   --   --   --   BILIDIR  --   --  <0.1*  --  0.1  --   --   --   --   IBILI  --   --  NOT CALCULATED  --  0.5  --   --   --   --   < > = values in this interval not displayed.  RADIOGRAPHIC STUDIES: I have personally reviewed the radiological images as listed and agreed with the findings in the report. No results found.  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 PET- positive paraesophgeal uptake.Txn1- stage III. S/p  concurrent chemoradiation- carbo-taxol weekly [finished May 21st].   # PET scan- July 11- 2018- improved SUV uptake at the lower end of the esophagus; no evidence of any distant metastatic disease. Endoscopy 08/30/2017 -no morphological evidence of recurrence. Continue surveillance.   # Difficulty swallowing - second malignancy/radiation- status post dilatation x2- slightly  Improved. Again awaiting in end of sep 2018.   # Acute renal failure- creatinine improved today at 1.0 [from previous 1.5]; sodium- 133/improved.  No fluids needed.   # History of hypertension- elevated in 168/90s; at home currently blood pressure is 130s to 140s as per patient;  She states to follow up with PCP/Dr.Tullo,   # Left lower lobe centimeter  lung nodule- question etiology. slightly smaller. Monitor for now  # Left renal mass- suspicious for malignancy. Stable on the recent PET scan; await repeat PET scan is planned in mid November 2018. Monitor for now  # vision issues-recommend follow up with opthalmology  # follow up in 2 months/ labs/PET scan prior.  cc; Dr.Lateef; Dr.Tullo; Dr. Laurel Dimmer, MD 09/03/2017 1:40 PM

## 2017-09-08 ENCOUNTER — Encounter: Payer: Self-pay | Admitting: Internal Medicine

## 2017-09-08 ENCOUNTER — Telehealth: Payer: Self-pay

## 2017-09-08 ENCOUNTER — Ambulatory Visit (INDEPENDENT_AMBULATORY_CARE_PROVIDER_SITE_OTHER): Payer: Medicare Other | Admitting: Internal Medicine

## 2017-09-08 VITALS — BP 170/82 | HR 115 | Temp 97.9°F | Resp 16 | Ht 60.0 in | Wt 124.0 lb

## 2017-09-08 DIAGNOSIS — E538 Deficiency of other specified B group vitamins: Secondary | ICD-10-CM

## 2017-09-08 DIAGNOSIS — E876 Hypokalemia: Secondary | ICD-10-CM | POA: Diagnosis not present

## 2017-09-08 DIAGNOSIS — E871 Hypo-osmolality and hyponatremia: Secondary | ICD-10-CM | POA: Diagnosis not present

## 2017-09-08 DIAGNOSIS — E034 Atrophy of thyroid (acquired): Secondary | ICD-10-CM

## 2017-09-08 DIAGNOSIS — Z23 Encounter for immunization: Secondary | ICD-10-CM

## 2017-09-08 DIAGNOSIS — R030 Elevated blood-pressure reading, without diagnosis of hypertension: Secondary | ICD-10-CM | POA: Diagnosis not present

## 2017-09-08 DIAGNOSIS — E559 Vitamin D deficiency, unspecified: Secondary | ICD-10-CM | POA: Diagnosis not present

## 2017-09-08 DIAGNOSIS — K222 Esophageal obstruction: Secondary | ICD-10-CM

## 2017-09-08 LAB — VITAMIN B12: Vitamin B-12: 432 pg/mL (ref 211–911)

## 2017-09-08 LAB — VITAMIN D 25 HYDROXY (VIT D DEFICIENCY, FRACTURES): VITD: 37.39 ng/mL (ref 30.00–100.00)

## 2017-09-08 MED ORDER — LABETALOL HCL 100 MG PO TABS: 100.0000 mg | ORAL_TABLET | Freq: Three times a day (TID) | ORAL | 0 refills | Status: DC

## 2017-09-08 NOTE — Progress Notes (Signed)
Subjective:  Patient ID: Brittney Jackson, female    DOB: 1930-07-10  Age: 83 y.o. MRN: [id number]  CC: The primary encounter diagnosis was B12 deficiency. Diagnoses of Vitamin D deficiency, Encounter for immunization, Stricture and stenosis of esophagus, Hypothyroidism due to acquired atrophy of thyroid, Hyponatremia, Hypokalemia, and White coat syndrome with high blood pressure without hypertension were also pertinent to this visit.  HPI Brittney Jackson presents for follow up on hypertension , hypothyroidism  Diagnosed with esophageal cancer in March after presenting with dysphagia and an 8 lb weight loss    Finished chemo and XRT .  Hair growing back , swallowing well.  PET scan to be repeated Nov 2018.     Renal mass and lung mass are both stable or shrinking,  No biopsies done   Esophagus HAS BEEN DILATED TWICE;  NEXT ONE IS SEPT 25TH .  Grandson getting married on sept 29th in [place]  In [place], [place]  and she is attending.   HTN meds stopped by oncology .  Home bps at home 136/71 without meds  Had a brief admission in July for diverticulitis and cystitis. Discharged home the following day   Yesterday's labs reviewed.  Chronic Hyponatremia mild,  Renal function improved.  Still crushing meds; taking only levothyroxine and omeprazole daily, potassium dissolved in applesauce or water  every other day 20 meq   Weight loss.  Has been increasing her protein intake intentionally due to development of edema.   Living alone,  Has a LIfe Line     Outpatient Medications Prior to Visit  Medication Sig Dispense Refill  . levothyroxine (SYNTHROID, LEVOTHROID) 88 MCG tablet TAKE ONE (1) TABLET EACH DAY 90 tablet 1  . omeprazole (PRILOSEC OTC) 20 MG tablet Take 20 mg by mouth daily. Chew tablets    . ondansetron (ZOFRAN-ODT) 4 MG disintegrating tablet Take 4 mg by mouth every 8 (eight) hours as needed.     . potassium chloride SA (K-DUR,KLOR-CON) 20 MEQ tablet Take 1 tablet (20 mEq  total) by mouth 2 (two) times daily. (Patient taking differently: Take 20 mEq by mouth every other day. ) 30 tablet 3  . HYDROcodone-acetaminophen (NORCO) 5-325 MG tablet Take 1 tablet by mouth every 6 (six) hours as needed for moderate pain. (Patient not taking: Reported on 07/16/2017) 60 tablet 0  . losartan (COZAAR) 100 MG tablet TAKE ONE (1) TABLET BY MOUTH EVERY DAY (Patient not taking: Reported on 07/16/2017) 90 tablet 1  . ranitidine (ZANTAC) 75 MG tablet Take 75 mg by mouth 2 (two) times daily.    . sucralfate (CARAFATE) 1 g tablet Take 1 tablet (1 g total) by mouth 4 (four) times daily -  with meals and at bedtime. Dissolve in warm water, swish and swallow (Patient not taking: Reported on 08/06/2017) 90 tablet 3  . labetalol (NORMODYNE) 300 MG tablet Take 0.5 tablets (150 mg total) by mouth 3 (three) times daily. 1/2 tab (150 mg) Am and PM (Patient not taking: Reported on 07/16/2017) 270 tablet 1   No facility-administered medications prior to visit.     Review of Systems;  Patient denies headache, fevers, malaise, unintentional weight loss, skin rash, eye pain, sinus congestion and sinus pain, sore throat, dysphagia,  hemoptysis , cough, dyspnea, wheezing, chest pain, palpitations, orthopnea, edema, abdominal pain, nausea, melena, diarrhea, constipation, flank pain, dysuria, hematuria, urinary  Frequency, nocturia, numbness, tingling, seizures,  Focal weakness, Loss of consciousness,  Tremor, insomnia, depression, anxiety, and suicidal ideation.  Objective:  BP (!) 170/82 (BP Location: Left Arm, Patient Position: Sitting, Cuff Size: Normal)   Pulse (!) 115   Temp 97.9 F (36.6 C) (Oral)   Resp 16   Ht 5' (1.524 m)   Wt 124 lb (56.2 kg)   SpO2 97%   BMI 24.22 kg/m   BP Readings from Last 3 Encounters:  09/08/17 (!) 170/82  09/03/17 (!) 168/96  08/31/17 (!) 193/83    Wt Readings from Last 3 Encounters:  09/08/17 124 lb (56.2 kg)  09/03/17 124 lb (56.2 kg)  08/31/17 125 lb  (56.7 kg)    General appearance: alert, cooperative and appears stated age Ears: normal TM's and external ear canals both ears Throat: lips, mucosa, and tongue normal; teeth and gums normal Neck: no adenopathy, no carotid bruit, supple, symmetrical, trachea midline and thyroid not enlarged, symmetric, no tenderness/mass/nodules Back: symmetric, no curvature. ROM normal. No CVA tenderness. Lungs: clear to auscultation bilaterally Heart: regular rate and rhythm, S1, S2 normal, no murmur, click, rub or gallop Abdomen: soft, non-tender; bowel sounds normal; no masses,  no organomegaly Pulses: 2+ and symmetric Skin: Skin color, texture, turgor normal. No rashes or lesions Lymph nodes: Cervical, supraclavicular, and axillary nodes normal.  Lab Results  Component Value Date   HGBA1C 6.7 (H) 12/28/2013    Lab Results  Component Value Date   CREATININE 1.08 (H) 09/03/2017   CREATININE 1.07 (H) 08/06/2017   CREATININE 1.40 (H) 07/30/2017    Lab Results  Component Value Date   WBC 4.9 09/03/2017   HGB 12.8 09/03/2017   HCT 36.4 09/03/2017   PLT 264 09/03/2017   GLUCOSE 108 (H) 09/03/2017   CHOL 137 02/26/2017   TRIG 139 02/26/2017   HDL 37 02/26/2017   LDLDIRECT 67.0 01/24/2013   LDLCALC 72 02/26/2017   ALT 20 07/05/2017   AST 25 07/05/2017   NA 133 (L) 09/03/2017   K 4.0 09/03/2017   CL 98 (L) 09/03/2017   CREATININE 1.08 (H) 09/03/2017   BUN 31 (H) 09/03/2017   CO2 26 09/03/2017   TSH 2.50 02/26/2017   INR 0.95 07/03/2017   HGBA1C 6.7 (H) 12/28/2013   MICROALBUR 29.7 02/26/2017    No results found.  Assessment & Plan:   Problem List Items Addressed This Visit    White coat syndrome with high blood pressure without hypertension    Home readings have been consistently < 140/80 without medications since her diagnosis of esophageal cancer and subsequent weight loss.       Relevant Medications   labetalol (NORMODYNE) 100 MG tablet   Other Relevant Orders    Microalbumin / creatinine urine ratio   Hypokalemia    Managed with QOD potassium supplementation   Lab Results  Component Value Date   NA 133 (L) 09/03/2017   K 4.0 09/03/2017   CL 98 (L) 09/03/2017   CO2 26 09/03/2017         Hyponatremia    Secondary to SIADH,  Since April 2015.  Currently nearly resolved, near normal      Hypothyroidism    Thyroid function is WNL on current dose.  No current changes needed. Repeat tsh is due   Lab Results  Component Value Date   TSH 2.50 02/26/2017              Relevant Medications   labetalol (NORMODYNE) 100 MG tablet   Other Relevant Orders   TSH   Stricture and stenosis of esophagus    Recurrent  secondary to XRT.  For 3rd dilation sept 25       Other Visit Diagnoses    B12 deficiency    -  Primary   Relevant Orders   Vitamin B12 (Completed)   Vitamin D deficiency       Relevant Orders   VITAMIN D 25 Hydroxy (Vit-D Deficiency, Fractures) (Completed)   Encounter for immunization       Relevant Orders   Flu vaccine HIGH DOSE PF (Completed)    A total of 25 minutes of face to face time was spent with patient more than half of which was spent in counselling and coordination of care   I have discontinued Ms. Chervenak's labetalol. I have also changed her labetalol. Additionally, I am having her maintain her losartan, sucralfate, levothyroxine, HYDROcodone-acetaminophen, ranitidine, ondansetron, potassium chloride SA, and omeprazole.  Meds ordered this encounter  Medications  . DISCONTD: labetalol (NORMODYNE) 100 MG tablet    Sig: Take 1 tablet (100 mg total) by mouth 3 (three) times daily. 1/2 tab (150 mg) Am and PM    Dispense:  60 tablet    Refill:  0  . labetalol (NORMODYNE) 100 MG tablet    Sig: Take 1 tablet (100 mg total) by mouth 3 (three) times daily.    Dispense:  60 tablet    Refill:  0    Medications Discontinued During This Encounter  Medication Reason  . labetalol (NORMODYNE) 300 MG tablet Reorder    . labetalol (NORMODYNE) 100 MG tablet Reorder    Follow-up: Return in about 6 months (around 03/08/2018).   Crecencio Mc, MD

## 2017-09-08 NOTE — Patient Instructions (Addendum)
To increase your protein without gaining weight :  You might want to try a premixed protein drink called Premier Protein shake for breakfast or late night snack . It is great tasting,   very low sugar and available of < $2 serving at [facility] and  In bulk for $1.50/serving at [employer] and [employer]  .    Nutritional analysis :  160 cal  30 g protein  1 g sugar 50% calcium needs   Vladimir Faster and [employer]   If you have to resume any blood pressure medications,  Start with 1/2 labetalol  Or use the 100 mg tablet (lower dose) .

## 2017-09-10 ENCOUNTER — Encounter: Payer: Self-pay | Admitting: Internal Medicine

## 2017-09-10 NOTE — Assessment & Plan Note (Signed)
Managed with QOD potassium supplementation   Lab Results  Component Value Date   NA 133 (L) 09/03/2017   K 4.0 09/03/2017   CL 98 (L) 09/03/2017   CO2 26 09/03/2017

## 2017-09-10 NOTE — Assessment & Plan Note (Signed)
Home readings have been consistently < 140/80 without medications since her diagnosis of esophageal cancer and subsequent weight loss.

## 2017-09-10 NOTE — Assessment & Plan Note (Signed)
Secondary to SIADH,  Since April 2015.  Currently nearly resolved, near normal

## 2017-09-10 NOTE — Assessment & Plan Note (Signed)
Thyroid function is WNL on current dose.  No current changes needed. Repeat tsh is due   Lab Results  Component Value Date   TSH 2.50 02/26/2017

## 2017-09-10 NOTE — Assessment & Plan Note (Signed)
Recurrent secondary to XRT.  For 3rd dilation sept 25

## 2017-09-21 ENCOUNTER — Ambulatory Visit: Payer: Medicare Other | Admitting: Anesthesiology

## 2017-09-21 ENCOUNTER — Encounter
Admission: RE | Disposition: A | Discharge status: Home / Self Care | Payer: Self-pay | Source: Ambulatory Visit | Attending: Gastroenterology

## 2017-09-21 ENCOUNTER — Ambulatory Visit
Admission: RE | Admit: 2017-09-21 | Discharge: 2017-09-21 | Disposition: A | Discharge status: Home / Self Care | Payer: Medicare Other | Source: Ambulatory Visit | Attending: Gastroenterology | Admitting: Gastroenterology

## 2017-09-21 DIAGNOSIS — Z9841 Cataract extraction status, right eye: Secondary | ICD-10-CM | POA: Insufficient documentation

## 2017-09-21 DIAGNOSIS — Z825 Family history of asthma and other chronic lower respiratory diseases: Secondary | ICD-10-CM | POA: Diagnosis not present

## 2017-09-21 DIAGNOSIS — Z79899 Other long term (current) drug therapy: Secondary | ICD-10-CM | POA: Diagnosis not present

## 2017-09-21 DIAGNOSIS — Z87891 Personal history of nicotine dependence: Secondary | ICD-10-CM | POA: Insufficient documentation

## 2017-09-21 DIAGNOSIS — N289 Disorder of kidney and ureter, unspecified: Secondary | ICD-10-CM | POA: Insufficient documentation

## 2017-09-21 DIAGNOSIS — M810 Age-related osteoporosis without current pathological fracture: Secondary | ICD-10-CM | POA: Diagnosis not present

## 2017-09-21 DIAGNOSIS — Z8249 Family history of ischemic heart disease and other diseases of the circulatory system: Secondary | ICD-10-CM | POA: Insufficient documentation

## 2017-09-21 DIAGNOSIS — Z8051 Family history of malignant neoplasm of kidney: Secondary | ICD-10-CM | POA: Diagnosis not present

## 2017-09-21 DIAGNOSIS — K449 Diaphragmatic hernia without obstruction or gangrene: Secondary | ICD-10-CM | POA: Diagnosis not present

## 2017-09-21 DIAGNOSIS — K2210 Ulcer of esophagus without bleeding: Secondary | ICD-10-CM | POA: Insufficient documentation

## 2017-09-21 DIAGNOSIS — R1319 Other dysphagia: Secondary | ICD-10-CM

## 2017-09-21 DIAGNOSIS — E039 Hypothyroidism, unspecified: Secondary | ICD-10-CM | POA: Diagnosis not present

## 2017-09-21 DIAGNOSIS — R1310 Dysphagia, unspecified: Secondary | ICD-10-CM

## 2017-09-21 DIAGNOSIS — I10 Essential (primary) hypertension: Secondary | ICD-10-CM | POA: Insufficient documentation

## 2017-09-21 DIAGNOSIS — K222 Esophageal obstruction: Secondary | ICD-10-CM

## 2017-09-21 DIAGNOSIS — Z9842 Cataract extraction status, left eye: Secondary | ICD-10-CM | POA: Insufficient documentation

## 2017-09-21 DIAGNOSIS — Z8501 Personal history of malignant neoplasm of esophagus: Secondary | ICD-10-CM | POA: Insufficient documentation

## 2017-09-21 DIAGNOSIS — M1712 Unilateral primary osteoarthritis, left knee: Secondary | ICD-10-CM | POA: Insufficient documentation

## 2017-09-21 DIAGNOSIS — M1990 Unspecified osteoarthritis, unspecified site: Secondary | ICD-10-CM | POA: Diagnosis not present

## 2017-09-21 DIAGNOSIS — Z888 Allergy status to other drugs, medicaments and biological substances status: Secondary | ICD-10-CM | POA: Diagnosis not present

## 2017-09-21 DIAGNOSIS — K208 Other esophagitis: Secondary | ICD-10-CM | POA: Diagnosis not present

## 2017-09-21 HISTORY — DX: Dysphagia, unspecified: R13.10

## 2017-09-21 HISTORY — PX: ESOPHAGOGASTRODUODENOSCOPY (EGD) WITH PROPOFOL: SHX5813

## 2017-09-21 HISTORY — DX: Diverticulitis of intestine, part unspecified, without perforation or abscess without bleeding: K57.92

## 2017-09-21 SURGERY — ESOPHAGOGASTRODUODENOSCOPY (EGD) WITH PROPOFOL
Anesthesia: General

## 2017-09-21 MED ORDER — LIDOCAINE HCL (CARDIAC) 20 MG/ML IV SOLN
INTRAVENOUS | Status: DC | PRN
  Administered 2017-09-21: 40 mg via INTRAVENOUS

## 2017-09-21 MED ORDER — LABETALOL HCL 5 MG/ML IV SOLN
INTRAVENOUS | Status: AC
  Filled 2017-09-21: qty 4

## 2017-09-21 MED ORDER — LIDOCAINE HCL (PF) 2 % IJ SOLN
INTRAMUSCULAR | Status: AC
  Filled 2017-09-21: qty 2

## 2017-09-21 MED ORDER — GLYCOPYRROLATE 0.2 MG/ML IJ SOLN
INTRAMUSCULAR | Status: AC
  Filled 2017-09-21: qty 1

## 2017-09-21 MED ORDER — LABETALOL HCL 5 MG/ML IV SOLN
INTRAVENOUS | Status: DC | PRN
  Administered 2017-09-21: 5 mg via INTRAVENOUS

## 2017-09-21 MED ORDER — PROPOFOL 500 MG/50ML IV EMUL
INTRAVENOUS | Status: DC | PRN
  Administered 2017-09-21: 100 ug/kg/min via INTRAVENOUS

## 2017-09-21 MED ORDER — MIDAZOLAM HCL 2 MG/2ML IJ SOLN
INTRAMUSCULAR | Status: AC
  Filled 2017-09-21: qty 2

## 2017-09-21 MED ORDER — PROPOFOL 10 MG/ML IV BOLUS
INTRAVENOUS | Status: DC | PRN
  Administered 2017-09-21: 50 mg via INTRAVENOUS

## 2017-09-21 MED ORDER — SODIUM CHLORIDE 0.9 % IV SOLN
INTRAVENOUS | Status: DC
  Administered 2017-09-21 (×2): via INTRAVENOUS

## 2017-09-21 MED ORDER — PROPOFOL 500 MG/50ML IV EMUL
INTRAVENOUS | Status: AC
  Filled 2017-09-21: qty 50

## 2017-09-21 MED ORDER — GLYCOPYRROLATE 0.2 MG/ML IJ SOLN
INTRAMUSCULAR | Status: DC | PRN
  Administered 2017-09-21: 0.1 mg via INTRAVENOUS

## 2017-09-21 NOTE — Anesthesia Post-op Follow-up Note (Signed)
Anesthesia QCDR form completed.        

## 2017-09-21 NOTE — H&P (View-Only) (Signed)
[name], MD [address] [place], [place] [address] Phone:[phone] Fax : [phone]  Primary Care Physician:  Crecencio Mc, MD Primary Gastroenterologist:  Dr. Allen Norris  Pre-Procedure History & Physical: HPI:  Brittney Jackson is a 83 y.o. female is here for an endoscopy.   Past Medical History:  Diagnosis Date  . Arthritis    left knee  . Cancer ([place])    esophageal cancer  . Function kidney decreased   . Hypertension   . Hypothyroidism   . Inappropriate ADH secretion ([facility])   . Low sodium levels   . Osteoporosis   . Renal disorder     Past Surgical History:  Procedure Laterality Date  . CATARACT EXTRACTION W/PHACO Left 05/29/2015   Procedure: CATARACT EXTRACTION PHACO AND INTRAOCULAR LENS PLACEMENT (IOC);  Surgeon: Leandrew Koyanagi, MD;  Location: [facility];  Service: Ophthalmology;  Laterality: Left;  . CATARACT EXTRACTION W/PHACO Right 01/15/2016   Procedure: CATARACT EXTRACTION PHACO AND INTRAOCULAR LENS PLACEMENT (IOC);  Surgeon: Leandrew Koyanagi, MD;  Location: [facility];  Service: Ophthalmology;  Laterality: Right;  SHUGARCAINE  . ESOPHAGOGASTRODUODENOSCOPY (EGD) WITH PROPOFOL N/A 03/16/2017   Procedure: ESOPHAGOGASTRODUODENOSCOPY (EGD) WITH PROPOFOL;  Surgeon: [name], MD;  Location: [facility] ENDOSCOPY;  Service: Endoscopy;  Laterality: N/A;  . ESOPHAGOGASTRODUODENOSCOPY (EGD) WITH PROPOFOL N/A 06/25/2017   Procedure: ESOPHAGOGASTRODUODENOSCOPY (EGD) WITH PROPOFOL;  Surgeon: Jonathon Bellows, MD;  Location[facility] ENDOSCOPY;  Service: Endoscopy;  Laterality: N/A;  . ESOPHAGOGASTRODUODENOSCOPY (EGD) WITH PROPOFOL N/A 07/14/2017   Procedure: ESOPHAGOGASTRODUODENOSCOPY (EGD) WITH PROPOFOL with dilation;  Surgeon: [name], MD;  Location: [facility] ENDOSCOPY;  Service: Endoscopy;  Laterality: N/A;  . TONSILLECTOMY      Prior to Admission medications   Medication Sig Start Date End Date Taking? Authorizing Provider    HYDROcodone-acetaminophen (NORCO) 5-325 MG tablet Take 1 tablet by mouth every 6 (six) hours as needed for moderate pain. Patient not taking: Reported on 07/16/2017 06/11/17   Sindy Guadeloupe, MD  labetalol (NORMODYNE) 300 MG tablet Take 0.5 tablets (150 mg total) by mouth 3 (three) times daily. 1/2 tab (150 mg) Am and PM Patient not taking: Reported on 07/16/2017 02/28/16   Crecencio Mc, MD  levothyroxine (SYNTHROID, LEVOTHROID) 88 MCG tablet TAKE ONE (1) TABLET EACH DAY 05/19/17   Crecencio Mc, MD  losartan (COZAAR) 100 MG tablet TAKE ONE (1) TABLET BY MOUTH EVERY DAY Patient not taking: Reported on 07/16/2017 12/23/16   Crecencio Mc, MD  ondansetron (ZOFRAN-ODT) 4 MG disintegrating tablet Take 4 mg by mouth every 8 (eight) hours as needed.  06/18/17   [provider]  potassium chloride SA (K-DUR,KLOR-CON) 20 MEQ tablet Take 1 tablet (20 mEq total) by mouth 2 (two) times daily. Patient taking differently: Take 20 mEq by mouth every other day.  07/09/17   Cammie Sickle, MD  ranitidine (ZANTAC) 75 MG tablet Take 75 mg by mouth 2 (two) times daily.    [provider]  sucralfate (CARAFATE) 1 g tablet Take 1 tablet (1 g total) by mouth 4 (four) times daily -  with meals and at bedtime. Dissolve in warm water, swish and swallow Patient not taking: Reported on 08/06/2017 04/06/17   Noreene Filbert, MD    Allergies as of 08/26/2017 - Review Complete 08/06/2017  Allergen Reaction Noted  . Amlodipine Swelling 01/09/2014    Family History  Problem Relation Age of Onset  . Heart attack Mother   . Cancer Maternal Uncle  renal cancer   . COPD Neg Hx        no breast, ovarian or colon    Social History   Social History  . Marital status: Widowed    Spouse name: N/A  . Number of children: N/A  . Years of education: N/A   Occupational History  . retired- Estate manager/land agent 1992 from the [employer]  . Smoking status: Former  Smoker    Types: Cigarettes    Quit date: 07/21/2000  . Smokeless tobacco: Never Used  . Alcohol use No  . Drug use: No  . Sexual activity: No   Other Topics Concern  . Not on file   Social History Narrative   Church activities, community service.   Lives alone.    Review of Systems: See HPI, otherwise negative ROS  Physical Exam: BP (!) 183/78   Pulse 96   Temp (!) 96.2 F (35.7 C) (Tympanic)   Resp 17   Ht 5' (1.524 m)   Wt 125 lb (56.7 kg)   SpO2 98%   BMI 24.41 kg/m  General:   Alert,  pleasant and cooperative in NAD Head:  Normocephalic and atraumatic. Neck:  Supple; no masses or thyromegaly. Lungs:  Clear throughout to auscultation.    Heart:  Regular rate and rhythm. Abdomen:  Soft, nontender and nondistended. Normal bowel sounds, without guarding, and without rebound.   Neurologic:  Alert and  oriented x4;  grossly normal neurologically.  Impression/Plan: Brittney Jackson is here for an endoscopy to be performed for dysphagia  Risks, benefits, limitations, and alternatives regarding  endoscopy have been reviewed with the patient.  Questions have been answered.  All parties agreeable.   [name], MD  08/31/2017, 9:55 AM

## 2017-09-21 NOTE — Anesthesia Postprocedure Evaluation (Signed)
Anesthesia Post Note  Patient: DRAKE [place]  Procedure(s) Performed: Procedure(s) (LRB): ESOPHAGOGASTRODUODENOSCOPY (EGD) WITH PROPOFOL (N/A)  Patient location during evaluation: Endoscopy Anesthesia Type: General Level of consciousness: awake and alert and oriented Pain management: pain level controlled Vital Signs Assessment: post-procedure vital signs reviewed and stable Respiratory status: spontaneous breathing, nonlabored ventilation and respiratory function stable Cardiovascular status: blood pressure returned to baseline and stable Postop Assessment: no signs of nausea or vomiting Anesthetic complications: no     Last Vitals:  Vitals:   09/21/17 0928 09/21/17 0958  BP: (!) 143/75 (!) 166/74  Pulse: 82   Resp: 20   Temp: (!) 35.6 C   SpO2: 98%     Last Pain:  Vitals:   09/21/17 0928  TempSrc: Tympanic  PainSc: 0-No pain                 Caisen Mangas

## 2017-09-21 NOTE — Interval H&P Note (Signed)
History and Physical Interval Note:  09/21/2017 9:01 AM  Brittney Jackson  has presented today for surgery, with the diagnosis of Dysphagia R13.10  The various methods of treatment have been discussed with the patient and family. After consideration of risks, benefits and other options for treatment, the patient has consented to  Procedure(s): ESOPHAGOGASTRODUODENOSCOPY (EGD) WITH PROPOFOL (N/A) as a surgical intervention .  The patient's history has been reviewed, patient examined, no change in status, stable for surgery.  I have reviewed the patient's chart and labs.  Questions were answered to the patient's satisfaction.     Brittney Jackson [employer]

## 2017-09-21 NOTE — Op Note (Signed)
[facility] Gastroenterology Patient Name: Brittney Jackson Procedure Date: 09/21/2017 9:09 AM MRN: [id number] Account #: [id number] Date of Birth: August 29, 1930 Admit Type: Outpatient Age: 83 Room: [facility] ENDO ROOM 4 Gender: Female Note Status: Finalized Procedure:            Upper GI endoscopy Indications:          Dysphagia Providers:            [name] MD, MD Referring MD:         Deborra Medina, MD (Referring MD) Medicines:            Propofol per Anesthesia Complications:        No immediate complications. Procedure:            Pre-Anesthesia Assessment:                       - Prior to the procedure, a History and Physical was                        performed, and patient medications and allergies were                        reviewed. The patient's tolerance of previous                        anesthesia was also reviewed. The risks and benefits of                        the procedure and the sedation options and risks were                        discussed with the patient. All questions were                        answered, and informed consent was obtained. Prior                        Anticoagulants: The patient has taken no previous                        anticoagulant or antiplatelet agents. ASA Grade                        Assessment: II - A patient with mild systemic disease.                        After reviewing the risks and benefits, the patient was                        deemed in satisfactory condition to undergo the                        procedure.                       After obtaining informed consent, the endoscope was                        passed under direct vision. Throughout the procedure,  the patient's blood pressure, pulse, and oxygen                        saturations were monitored continuously. The Endoscope                        was introduced through the mouth, and advanced to the                        second  part of duodenum. The upper GI endoscopy was                        accomplished without difficulty. The patient tolerated                        the procedure well. Findings:      One severe stenosis was found at the gastroesophageal junction. This       measured 9 mm (inner diameter) and was traversed after dilation. A TTS       dilator was passed through the scope. Dilation with a 10-08-11 mm       balloon dilator was performed to 12 mm. The dilation site was examined       following endoscope reinsertion and showed moderate improvement in       luminal narrowing. Biopsies were taken with a cold forceps for histology.      The stomach was normal.      The examined duodenum was normal. Impression:           - Esophageal stenosis. Dilated. Biopsied.                       - Normal stomach.                       - Normal examined duodenum. Recommendation:       - Await pathology results.                       - Discharge patient to home.                       - Resume previous diet.                       - Continue present medications. Procedure Code(s):    --- Professional ---                       [phone], Esophagogastroduodenoscopy, flexible, transoral;                        with transendoscopic balloon dilation of esophagus                        (less than 30 mm diameter)                       43239, Esophagogastroduodenoscopy, flexible, transoral;                        with biopsy, single or multiple Diagnosis Code(s):    --- Professional ---  R13.10, Dysphagia, unspecified                       K22.2, Esophageal obstruction CPT copyright 2016 American Medical Association. All rights reserved. The codes documented in this report are preliminary and upon coder review may  be revised to meet current compliance requirements. [name] MD, MD 09/21/2017 9:24:34 AM This report has been signed electronically. Number of Addenda: 0 Note Initiated On: 09/21/2017  9:09 AM      [facility]

## 2017-09-21 NOTE — Transfer of Care (Signed)
Immediate Anesthesia Transfer of Care Note  Patient: Brittney Jackson  Procedure(s) Performed: Procedure(s): ESOPHAGOGASTRODUODENOSCOPY (EGD) WITH PROPOFOL (N/A)  Patient Location: Endoscopy Unit  Anesthesia Type:General  Level of Consciousness: awake, alert , oriented and patient cooperative  Airway & Oxygen Therapy: Patient Spontanous Breathing and Patient connected to nasal cannula oxygen  Post-op Assessment: Report given to RN, Post -op Vital signs reviewed and stable and Patient moving all extremities X 4  Post vital signs: Reviewed and stable  Last Vitals:  Vitals:   09/21/17 0839  BP: (!) 162/87  Pulse: 86  Resp: 18  Temp: (!) 35.6 C  SpO2: 94%    Last Pain:  Vitals:   09/21/17 0839  TempSrc: Tympanic         Complications: No apparent anesthesia complications

## 2017-09-21 NOTE — Anesthesia Preprocedure Evaluation (Signed)
Anesthesia Evaluation  Patient identified by MRN, date of birth, ID band Patient awake    Reviewed: Allergy & Precautions, NPO status , Patient's Chart, lab work & pertinent test results  History of Anesthesia Complications Negative for: history of anesthetic complications  Airway Mallampati: II  TM Distance: >3 FB Neck ROM: Full    Dental no notable dental hx.    Pulmonary neg sleep apnea, neg COPD, former smoker,    breath sounds clear to auscultation- rhonchi (-) wheezing      Cardiovascular hypertension, Pt. on medications (-) CAD, (-) Past MI and (-) Cardiac Stents  Rhythm:Regular Rate:Normal - Systolic murmurs and - Diastolic murmurs    Neuro/Psych negative neurological ROS  negative psych ROS   GI/Hepatic Neg liver ROS, dysphagia   Endo/Other  neg diabetesHypothyroidism   Renal/GU Renal InsufficiencyRenal disease     Musculoskeletal  (+) Arthritis ,   Abdominal (+) - obese,   Peds  Hematology negative hematology ROS (+)   Anesthesia Other Findings Past Medical History: No date: Arthritis     Comment:  left knee No date: Cancer ([place])     Comment:  esophageal cancer No date: Diverticulitis No date: Dysphagia No date: Function kidney decreased No date: Hypertension No date: Hypothyroidism No date: Inappropriate ADH secretion (HCC) No date: Low sodium levels No date: Osteoporosis No date: Renal disorder   Reproductive/Obstetrics                             Anesthesia Physical Anesthesia Plan  ASA: III  Anesthesia Plan: General   Post-op Pain Management:    Induction: Intravenous  PONV Risk Score and Plan: 2 and Propofol infusion  Airway Management Planned: Natural Airway  Additional Equipment:   Intra-op Plan:   Post-operative Plan:   Informed Consent: I have reviewed the patients History and Physical, chart, labs and discussed the procedure including the  risks, benefits and alternatives for the proposed anesthesia with the patient or authorized representative who has indicated his/her understanding and acceptance.   Dental advisory given  Plan Discussed with: CRNA and Anesthesiologist  Anesthesia Plan Comments:         Anesthesia Quick Evaluation

## 2017-09-22 ENCOUNTER — Encounter: Payer: Self-pay | Admitting: Gastroenterology

## 2017-09-22 LAB — SURGICAL PATHOLOGY

## 2017-09-22 NOTE — Telephone Encounter (Signed)
Error

## 2017-09-24 ENCOUNTER — Telehealth: Payer: Self-pay

## 2017-09-24 NOTE — Telephone Encounter (Signed)
Pt's daughter, Jeani Hawking notified of EGD results.

## 2017-09-24 NOTE — Telephone Encounter (Signed)
-----   Message from [name], MD sent at 09/23/2017  2:05 PM EDT ----- Let the patient know the biopsy showed inflammation with scarring and ulcerations and changes from radiation but no cancer seen.

## 2017-09-28 NOTE — Telephone Encounter (Signed)
error 

## 2017-10-11 ENCOUNTER — Telehealth: Payer: Self-pay | Admitting: Gastroenterology

## 2017-10-11 ENCOUNTER — Other Ambulatory Visit: Payer: Self-pay

## 2017-10-11 DIAGNOSIS — R1310 Dysphagia, unspecified: Secondary | ICD-10-CM

## 2017-10-11 DIAGNOSIS — R1319 Other dysphagia: Secondary | ICD-10-CM

## 2017-10-11 NOTE — Telephone Encounter (Signed)
Patients daughter, [name], called to schedule her mother another EGD. She is starting to close up again. Please call [phone]

## 2017-10-12 ENCOUNTER — Encounter
Admission: RE | Disposition: A | Discharge status: Home / Self Care | Payer: Self-pay | Source: Ambulatory Visit | Attending: Gastroenterology

## 2017-10-12 ENCOUNTER — Ambulatory Visit: Payer: Medicare Other | Admitting: Registered Nurse

## 2017-10-12 ENCOUNTER — Ambulatory Visit
Admission: RE | Admit: 2017-10-12 | Discharge: 2017-10-12 | Disposition: A | Discharge status: Home / Self Care | Payer: Medicare Other | Source: Ambulatory Visit | Attending: Gastroenterology | Admitting: Gastroenterology

## 2017-10-12 ENCOUNTER — Encounter: Payer: Self-pay | Admitting: *Deleted

## 2017-10-12 DIAGNOSIS — Z8249 Family history of ischemic heart disease and other diseases of the circulatory system: Secondary | ICD-10-CM | POA: Diagnosis not present

## 2017-10-12 DIAGNOSIS — Z8719 Personal history of other diseases of the digestive system: Secondary | ICD-10-CM | POA: Insufficient documentation

## 2017-10-12 DIAGNOSIS — Z888 Allergy status to other drugs, medicaments and biological substances status: Secondary | ICD-10-CM | POA: Insufficient documentation

## 2017-10-12 DIAGNOSIS — Z8501 Personal history of malignant neoplasm of esophagus: Secondary | ICD-10-CM | POA: Insufficient documentation

## 2017-10-12 DIAGNOSIS — Z79899 Other long term (current) drug therapy: Secondary | ICD-10-CM | POA: Insufficient documentation

## 2017-10-12 DIAGNOSIS — K222 Esophageal obstruction: Secondary | ICD-10-CM | POA: Diagnosis not present

## 2017-10-12 DIAGNOSIS — I10 Essential (primary) hypertension: Secondary | ICD-10-CM | POA: Diagnosis not present

## 2017-10-12 DIAGNOSIS — Z87891 Personal history of nicotine dependence: Secondary | ICD-10-CM | POA: Diagnosis not present

## 2017-10-12 DIAGNOSIS — M1990 Unspecified osteoarthritis, unspecified site: Secondary | ICD-10-CM | POA: Diagnosis not present

## 2017-10-12 DIAGNOSIS — M1712 Unilateral primary osteoarthritis, left knee: Secondary | ICD-10-CM | POA: Insufficient documentation

## 2017-10-12 DIAGNOSIS — E039 Hypothyroidism, unspecified: Secondary | ICD-10-CM | POA: Insufficient documentation

## 2017-10-12 DIAGNOSIS — M810 Age-related osteoporosis without current pathological fracture: Secondary | ICD-10-CM | POA: Diagnosis not present

## 2017-10-12 DIAGNOSIS — R1310 Dysphagia, unspecified: Secondary | ICD-10-CM

## 2017-10-12 DIAGNOSIS — R1319 Other dysphagia: Secondary | ICD-10-CM

## 2017-10-12 HISTORY — PX: ESOPHAGOGASTRODUODENOSCOPY (EGD) WITH PROPOFOL: SHX5813

## 2017-10-12 SURGERY — ESOPHAGOGASTRODUODENOSCOPY (EGD) WITH PROPOFOL
Anesthesia: General

## 2017-10-12 MED ORDER — DEXAMETHASONE SODIUM PHOSPHATE 4 MG/ML IJ SOLN
INTRAMUSCULAR | Status: DC | PRN
  Administered 2017-10-12: 4 mg via INTRAVENOUS

## 2017-10-12 MED ORDER — ONDANSETRON HCL 4 MG/2ML IJ SOLN
INTRAMUSCULAR | Status: DC | PRN
  Administered 2017-10-12: 4 mg via INTRAVENOUS

## 2017-10-12 MED ORDER — LIDOCAINE HCL (CARDIAC) 20 MG/ML IV SOLN
INTRAVENOUS | Status: DC | PRN
  Administered 2017-10-12: 20 mg via INTRAVENOUS

## 2017-10-12 MED ORDER — LIDOCAINE HCL (PF) 2 % IJ SOLN
INTRAMUSCULAR | Status: AC
  Filled 2017-10-12: qty 10

## 2017-10-12 MED ORDER — DEXAMETHASONE SODIUM PHOSPHATE 10 MG/ML IJ SOLN
INTRAMUSCULAR | Status: AC
  Filled 2017-10-12: qty 1

## 2017-10-12 MED ORDER — EPHEDRINE SULFATE 50 MG/ML IJ SOLN
INTRAMUSCULAR | Status: AC
Start: 2017-10-12 — End: ?
  Filled 2017-10-12: qty 1

## 2017-10-12 MED ORDER — PROPOFOL 10 MG/ML IV BOLUS
INTRAVENOUS | Status: AC
  Filled 2017-10-12: qty 20

## 2017-10-12 MED ORDER — SODIUM CHLORIDE 0.9 % IV SOLN
INTRAVENOUS | Status: DC
  Administered 2017-10-12: 10:00:00 via INTRAVENOUS

## 2017-10-12 MED ORDER — ONDANSETRON HCL 4 MG/2ML IJ SOLN
INTRAMUSCULAR | Status: AC
  Filled 2017-10-12: qty 2

## 2017-10-12 MED ORDER — PROPOFOL 500 MG/50ML IV EMUL
INTRAVENOUS | Status: DC | PRN
  Administered 2017-10-12: 120 ug/kg/min via INTRAVENOUS

## 2017-10-12 NOTE — Anesthesia Post-op Follow-up Note (Signed)
Anesthesia QCDR form completed.        

## 2017-10-12 NOTE — Anesthesia Preprocedure Evaluation (Signed)
Anesthesia Evaluation  Patient identified by MRN, date of birth, ID band Patient awake    Reviewed: Allergy & Precautions, H&P , NPO status , Patient's Chart, lab work & pertinent test results  History of Anesthesia Complications (+) PONV and history of anesthetic complications  Airway Mallampati: III  TM Distance: <3 FB Neck ROM: limited    Dental  (+) Poor Dentition, Chipped   Pulmonary neg shortness of breath, former smoker,           Cardiovascular Exercise Tolerance: Good hypertension, (-) angina(-) Past MI and (-) DOE      Neuro/Psych negative neurological ROS  negative psych ROS   GI/Hepatic negative GI ROS, Neg liver ROS,   Endo/Other  Hypothyroidism   Renal/GU Renal disease  negative genitourinary   Musculoskeletal  (+) Arthritis ,   Abdominal   Peds  Hematology negative hematology ROS (+)   Anesthesia Other Findings Past Medical History: No date: Arthritis     Comment:  left knee No date: Cancer (HCC)     Comment:  esophageal cancer No date: Diverticulitis No date: Dysphagia No date: Function kidney decreased No date: Hypertension No date: Hypothyroidism No date: Inappropriate ADH secretion (HCC) No date: Low sodium levels No date: Osteoporosis No date: Renal disorder  Past Surgical History: 05/29/2015: CATARACT EXTRACTION W/PHACO; Left     Comment:  Procedure: CATARACT EXTRACTION PHACO AND INTRAOCULAR               LENS PLACEMENT (IOC);  Surgeon: Leandrew Koyanagi, MD;               Location: [place];  Service: Ophthalmology;                Laterality: Left; 01/15/2016: CATARACT EXTRACTION W/PHACO; Right     Comment:  Procedure: CATARACT EXTRACTION PHACO AND INTRAOCULAR               LENS PLACEMENT (IOC);  Surgeon: Leandrew Koyanagi, MD;               Location: [place];  Service: Ophthalmology;                Laterality: Right;  [place] 03/16/2017:  ESOPHAGOGASTRODUODENOSCOPY (EGD) WITH PROPOFOL; N/A     Comment:  Procedure: ESOPHAGOGASTRODUODENOSCOPY (EGD) WITH               PROPOFOL;  Surgeon: [name], MD;  Location: [facility]               ENDOSCOPY;  Service: Endoscopy;  Laterality: N/A; 06/25/2017: ESOPHAGOGASTRODUODENOSCOPY (EGD) WITH PROPOFOL; N/A     Comment:  Procedure: ESOPHAGOGASTRODUODENOSCOPY (EGD) WITH               PROPOFOL;  Surgeon: Jonathon Bellows, MD;  Location: [facility]               ENDOSCOPY;  Service: Endoscopy;  Laterality: N/A; 07/14/2017: ESOPHAGOGASTRODUODENOSCOPY (EGD) WITH PROPOFOL; N/A     Comment:  Procedure: ESOPHAGOGASTRODUODENOSCOPY (EGD) WITH               PROPOFOL with dilation;  Surgeon: [name], MD;                Location: [facility] ENDOSCOPY;  Service: Endoscopy;                Laterality: N/A; 08/31/2017: ESOPHAGOGASTRODUODENOSCOPY (EGD) WITH PROPOFOL; N/A     Comment:  Procedure: ESOPHAGOGASTRODUODENOSCOPY (EGD) WITH  PROPOFOL;  Surgeon: [name], MD;  [facility]               ENDOSCOPY;  Service: Endoscopy;  Laterality: N/A; 09/21/2017: ESOPHAGOGASTRODUODENOSCOPY (EGD) WITH PROPOFOL; N/A     Comment:  Procedure: ESOPHAGOGASTRODUODENOSCOPY (EGD) WITH               PROPOFOL;  Surgeon: [name], MD;  Location: [facility]               ENDOSCOPY;  Service: Endoscopy;  Laterality: N/A; No date: TONSILLECTOMY  BMI    Body Mass Index:  24.22 kg/m      Reproductive/Obstetrics negative OB ROS                             Anesthesia Physical Anesthesia Plan  ASA: III  Anesthesia Plan: General   Post-op Pain Management:    Induction: Intravenous  PONV Risk Score and Plan: Propofol infusion, Ondansetron and Dexamethasone  Airway Management Planned: Natural Airway and Nasal Cannula  Additional Equipment:   Intra-op Plan:   Post-operative Plan:   Informed Consent: I have reviewed the patients History and Physical, chart, labs and discussed the procedure  including the risks, benefits and alternatives for the proposed anesthesia with the patient or authorized representative who has indicated his/her understanding and acceptance.   Dental Advisory Given  Plan Discussed with: Anesthesiologist, CRNA and Surgeon  Anesthesia Plan Comments: (Patient consented for risks of anesthesia including but not limited to:  - adverse reactions to medications - risk of intubation if required - damage to teeth, lips or other oral mucosa - sore throat or hoarseness - Damage to heart, brain, lungs or loss of life  Patient voiced understanding.)        Anesthesia Quick Evaluation

## 2017-10-12 NOTE — Anesthesia Postprocedure Evaluation (Signed)
Anesthesia Post Note  Patient: Brittney Jackson  Procedure(s) Performed: ESOPHAGOGASTRODUODENOSCOPY (EGD) WITH PROPOFOL (N/A )  Patient location during evaluation: PACU Anesthesia Type: General Level of consciousness: awake Pain management: pain level controlled Vital Signs Assessment: post-procedure vital signs reviewed and stable Respiratory status: spontaneous breathing Cardiovascular status: stable Anesthetic complications: no     Last Vitals:  Vitals:   10/12/17 0956 10/12/17 1151  BP: (!) 181/69   Pulse: 81   Resp: 16   Temp: (!) 35.3 C (!) 36.2 C  SpO2: 99%     Last Pain:  Vitals:   10/12/17 1151  TempSrc: Tympanic                 VAN STAVEREN,Winferd Wease

## 2017-10-12 NOTE — Telephone Encounter (Signed)
Pt scheduled for an EGD with dilation at [facility] on 10/12/17.

## 2017-10-12 NOTE — Transfer of Care (Signed)
Immediate Anesthesia Transfer of Care Note  Patient: Brittney Jackson  Procedure(s) Performed: ESOPHAGOGASTRODUODENOSCOPY (EGD) WITH PROPOFOL (N/A )  Patient Location: PACU  Anesthesia Type:General  Level of Consciousness: awake and sedated  Airway & Oxygen Therapy: Patient Spontanous Breathing and Patient connected to nasal cannula oxygen  Post-op Assessment: Report given to RN and Post -op Vital signs reviewed and stable  Post vital signs: Reviewed and stable  Last Vitals:  Vitals:   10/12/17 0956  BP: (!) 181/69  Pulse: 81  Resp: 16  Temp: (!) 35.3 C  SpO2: 99%    Last Pain:  Vitals:   10/12/17 0956  TempSrc: Tympanic         Complications: No apparent anesthesia complications

## 2017-10-12 NOTE — H&P (Signed)
[name], MD [address] [place], [place] [address] [phone] Fax : [phone]  Primary Care Physician:  Crecencio Mc, MD Primary Gastroenterologist:  Dr. Allen Norris  Pre-Procedure History & Physical: HPI:  Brittney Jackson is a 83 y.o. female is here for an endoscopy.   Past Medical History:  Diagnosis Date  . Arthritis    left knee  . Cancer ([place])    esophageal cancer  . Diverticulitis   . Dysphagia   . Function kidney decreased   . Hypertension   . Hypothyroidism   . Inappropriate ADH secretion (HCC)   . Low sodium levels   . Osteoporosis   . Renal disorder     Past Surgical History:  Procedure Laterality Date  . CATARACT EXTRACTION W/PHACO Left 05/29/2015   Procedure: CATARACT EXTRACTION PHACO AND INTRAOCULAR LENS PLACEMENT (IOC);  Surgeon: Leandrew Koyanagi, MD;  Location: [place];  Service: Ophthalmology;  Laterality: Left;  . CATARACT EXTRACTION W/PHACO Right 01/15/2016   Procedure: CATARACT EXTRACTION PHACO AND INTRAOCULAR LENS PLACEMENT (IOC);  Surgeon: Leandrew Koyanagi, MD;  Location: [place];  Service: Ophthalmology;  Laterality: Right;  [place]  . ESOPHAGOGASTRODUODENOSCOPY (EGD) WITH PROPOFOL N/A 03/16/2017   Procedure: ESOPHAGOGASTRODUODENOSCOPY (EGD) WITH PROPOFOL;  Surgeon: [name], MD;  Location: [facility] ENDOSCOPY;  Service: Endoscopy;  Laterality: N/A;  . ESOPHAGOGASTRODUODENOSCOPY (EGD) WITH PROPOFOL N/A 06/25/2017   Procedure: ESOPHAGOGASTRODUODENOSCOPY (EGD) WITH PROPOFOL;  Surgeon: Jonathon Bellows, MD;  Location: [facility] ENDOSCOPY;  Service: Endoscopy;  Laterality: N/A;  . ESOPHAGOGASTRODUODENOSCOPY (EGD) WITH PROPOFOL N/A 07/14/2017   Procedure: ESOPHAGOGASTRODUODENOSCOPY (EGD) WITH PROPOFOL with dilation;  Surgeon: [name], MD;  Location: [facility] ENDOSCOPY;  Service: Endoscopy;  Laterality: N/A;  . ESOPHAGOGASTRODUODENOSCOPY (EGD) WITH PROPOFOL N/A 08/31/2017   Procedure: ESOPHAGOGASTRODUODENOSCOPY  (EGD) WITH PROPOFOL;  Surgeon: [name], MD;  Location: [facility] ENDOSCOPY;  Service: Endoscopy;  Laterality: N/A;  . ESOPHAGOGASTRODUODENOSCOPY (EGD) WITH PROPOFOL N/A 09/21/2017   Procedure: ESOPHAGOGASTRODUODENOSCOPY (EGD) WITH PROPOFOL;  Surgeon: [name], MD;  Location: [facility] ENDOSCOPY;  Service: Endoscopy;  Laterality: N/A;  . TONSILLECTOMY      Prior to Admission medications   Medication Sig Start Date End Date Taking? Authorizing Provider  levothyroxine (SYNTHROID, LEVOTHROID) 88 MCG tablet TAKE ONE (1) TABLET EACH DAY 05/19/17  Yes Crecencio Mc, MD  omeprazole (PRILOSEC OTC) 20 MG tablet Take 20 mg by mouth daily. Chew tablets   Yes [provider]  potassium chloride SA (K-DUR,KLOR-CON) 20 MEQ tablet Take 1 tablet (20 mEq total) by mouth 2 (two) times daily. Patient taking differently: Take 20 mEq by mouth every other day.  07/09/17  Yes Cammie Sickle, MD  sucralfate (CARAFATE) 1 g tablet Take 1 tablet (1 g total) by mouth 4 (four) times daily -  with meals and at bedtime. Dissolve in warm water, swish and swallow 04/06/17  Yes Chrystal, Eulas Post, MD  HYDROcodone-acetaminophen (NORCO) 5-325 MG tablet Take 1 tablet by mouth every 6 (six) hours as needed for moderate pain. Patient not taking: Reported on 07/16/2017 06/11/17   Sindy Guadeloupe, MD  labetalol (NORMODYNE) 100 MG tablet Take 1 tablet (100 mg total) by mouth 3 (three) times daily. Patient not taking: Reported on 09/21/2017 09/08/17   Crecencio Mc, MD  ondansetron (ZOFRAN-ODT) 4 MG disintegrating tablet Take 4 mg by mouth every 8 (eight) hours as needed.  06/18/17   [provider]  ranitidine (ZANTAC) 75 MG tablet Take 75 mg by mouth 2 (two) times daily.  [provider]    Allergies as of 10/11/2017 - Review Complete 09/21/2017  Allergen Reaction Noted  . Amlodipine Swelling 01/09/2014    Family History  Problem Relation Age of Onset  . Heart attack Mother   . Cancer Maternal Uncle         renal cancer   . COPD Neg Hx        no breast, ovarian or colon    Social History   Social History  . Marital status: Widowed    Spouse name: N/A  . Number of children: N/A  . Years of education: N/A   Occupational History  . 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 Smoking status: Former Smoker    Types: Cigarettes    Quit date: 07/21/2000  . Smokeless tobacco: Never Used  . Alcohol use No  . Drug use: No  . Sexual activity: No   Other Topics Concern  . Not on file   Social History Narrative   Church activities, community service.   Lives alone.    Review of Systems: See HPI, otherwise negative ROS  Physical Exam: There were no vitals taken for this visit. General:   Alert,  pleasant and cooperative in NAD Head:  Normocephalic and atraumatic. Neck:  Supple; no masses or thyromegaly. Lungs:  Clear throughout to auscultation.    Heart:  Regular rate and rhythm. Abdomen:  Soft, nontender and nondistended. Normal bowel sounds, without guarding, and without rebound.   Neurologic:  Alert and  oriented x4;  grossly normal neurologically.  Impression/Plan: Brittney Jackson is here for an endoscopy to be performed for esophageal stricture  Risks, benefits, limitations, and alternatives regarding  endoscopy have been reviewed with the patient.  Questions have been answered.  All parties agreeable.   [name], MD  10/12/2017, 10:13 AM

## 2017-10-12 NOTE — Anesthesia Procedure Notes (Signed)
Performed by: COOK-MARTIN, Hazleigh Mccleave Pre-anesthesia Checklist: Patient identified, Emergency Drugs available, Suction available, Patient being monitored and Timeout performed Patient Re-evaluated:Patient Re-evaluated prior to induction Oxygen Delivery Method: Nasal cannula Preoxygenation: Pre-oxygenation with 100% oxygen Induction Type: IV induction Airway Equipment and Method: Bite block Placement Confirmation: CO2 detector and positive ETCO2       

## 2017-10-12 NOTE — Op Note (Signed)
[facility] Patient Name: Brittney Jackson Procedure Date: 10/12/2017 11:29 AM MRN: [id number] Account #: [id number] Date of Birth: 01-13-1930 Admit Type: Outpatient Age: 83 Room: [facility] ENDO ROOM 4 Gender: Female Note Status: Finalized Procedure:            Upper GI endoscopy Indications:          Dysphagia Providers:            [name] MD, MD Medicines:            Propofol per Anesthesia Complications:        No immediate complications. Procedure:            Pre-Anesthesia Assessment:                       - Prior to the procedure, a History and Physical was                        performed, and patient medications and allergies were                        reviewed. The patient's tolerance of previous                        anesthesia was also reviewed. The risks and benefits of                        the procedure and the sedation options and risks were                        discussed with the patient. All questions were                        answered, and informed consent was obtained. Prior                        Anticoagulants: The patient has taken no previous                        anticoagulant or antiplatelet agents. ASA Grade                        Assessment: II - A patient with mild systemic disease.                        After reviewing the risks and benefits, the patient was                        deemed in satisfactory condition to undergo the                        procedure.                       After obtaining informed consent, the endoscope was                        passed under direct vision. Throughout the procedure,                        the patient's blood pressure, pulse, and  oxygen                        saturations were monitored continuously. The Endoscope                        was introduced through the mouth, and advanced to the                        second part of duodenum. The upper GI endoscopy was                   accomplished without difficulty. The patient tolerated                        the procedure well. Findings:      One severe stenosis was found in the lower third of the esophagus. This       measured 9 mm (inner diameter) and was traversed after dilation. A TTS       dilator was passed through the scope. Dilation with a 12-13.5-15 mm       balloon dilator was performed to 15 mm. The dilation site was examined       following endoscope reinsertion and showed moderate improvement in       luminal narrowing.      The stomach was normal.      The examined duodenum was normal. Impression:           - Esophageal stenosis. Dilated.                       - Normal stomach.                       - Normal examined duodenum.                       - No specimens collected. Recommendation:       - Discharge patient to home.                       - Resume previous diet.                       - Continue present medications. Procedure Code(s):    --- Professional ---                       [phone], Esophagogastroduodenoscopy, flexible, transoral;                        with transendoscopic balloon dilation of esophagus                        (less than 30 mm diameter) Diagnosis Code(s):    --- Professional ---                       R13.10, Dysphagia, unspecified                       K22.2, Esophageal obstruction CPT copyright 2016 American Medical Association. All rights reserved. The codes documented in this report are preliminary and upon coder review may  be revised to meet current compliance requirements. [name] MD, MD 10/12/2017 11:54:40 AM This  report has been signed electronically. Number of Addenda: 0 Note Initiated On: 10/12/2017 11:29 AM      [facility]

## 2017-10-13 ENCOUNTER — Encounter: Payer: Self-pay | Admitting: Gastroenterology

## 2017-10-14 DIAGNOSIS — H26492 Other secondary cataract, left eye: Secondary | ICD-10-CM | POA: Diagnosis not present

## 2017-11-01 LAB — BASIC METABOLIC PANEL
BUN: 26 — AB (ref 4–21)
CREATININE: 0.9 (ref 0.5–1.1)
Glucose: 81
Sodium: 138 (ref 137–147)

## 2017-11-01 LAB — CBC AND DIFFERENTIAL
NEUTROS ABS: 2
WBC: 5

## 2017-11-01 LAB — HEPATIC FUNCTION PANEL: BILIRUBIN, TOTAL: 0.2

## 2017-11-02 DIAGNOSIS — E781 Pure hyperglyceridemia: Secondary | ICD-10-CM | POA: Diagnosis not present

## 2017-11-02 DIAGNOSIS — N183 Chronic kidney disease, stage 3 (moderate): Secondary | ICD-10-CM | POA: Diagnosis not present

## 2017-11-02 DIAGNOSIS — E871 Hypo-osmolality and hyponatremia: Secondary | ICD-10-CM | POA: Diagnosis not present

## 2017-11-02 DIAGNOSIS — I10 Essential (primary) hypertension: Secondary | ICD-10-CM | POA: Diagnosis not present

## 2017-11-02 DIAGNOSIS — N2581 Secondary hyperparathyroidism of renal origin: Secondary | ICD-10-CM | POA: Diagnosis not present

## 2017-11-03 ENCOUNTER — Encounter
Admission: RE | Admit: 2017-11-03 | Discharge: 2017-11-03 | Disposition: A | Discharge status: Home / Self Care | Payer: Medicare Other | Source: Ambulatory Visit | Attending: Internal Medicine | Admitting: Internal Medicine

## 2017-11-03 ENCOUNTER — Other Ambulatory Visit: Payer: Self-pay | Admitting: *Deleted

## 2017-11-03 DIAGNOSIS — C159 Malignant neoplasm of esophagus, unspecified: Secondary | ICD-10-CM | POA: Diagnosis not present

## 2017-11-03 DIAGNOSIS — C155 Malignant neoplasm of lower third of esophagus: Secondary | ICD-10-CM

## 2017-11-03 LAB — GLUCOSE, CAPILLARY: Glucose-Capillary: 100 mg/dL — ABNORMAL HIGH (ref 65–99)

## 2017-11-03 MED ORDER — FLUDEOXYGLUCOSE F - 18 (FDG) INJECTION
12.0000 | Freq: Once | INTRAVENOUS | Status: AC | PRN
  Administered 2017-11-03: 12.88 via INTRAVENOUS

## 2017-11-05 ENCOUNTER — Encounter: Payer: Self-pay | Admitting: Internal Medicine

## 2017-11-05 ENCOUNTER — Inpatient Hospital Stay: Payer: Medicare Other | Attending: Internal Medicine | Admitting: Internal Medicine

## 2017-11-05 ENCOUNTER — Other Ambulatory Visit: Payer: Self-pay

## 2017-11-05 ENCOUNTER — Inpatient Hospital Stay: Payer: Medicare Other

## 2017-11-05 VITALS — BP 206/85 | HR 85 | Temp 97.5°F | Resp 20

## 2017-11-05 DIAGNOSIS — I10 Essential (primary) hypertension: Secondary | ICD-10-CM

## 2017-11-05 DIAGNOSIS — N2889 Other specified disorders of kidney and ureter: Secondary | ICD-10-CM

## 2017-11-05 DIAGNOSIS — E222 Syndrome of inappropriate secretion of antidiuretic hormone: Secondary | ICD-10-CM | POA: Diagnosis not present

## 2017-11-05 DIAGNOSIS — M810 Age-related osteoporosis without current pathological fracture: Secondary | ICD-10-CM | POA: Diagnosis not present

## 2017-11-05 DIAGNOSIS — R948 Abnormal results of function studies of other organs and systems: Secondary | ICD-10-CM | POA: Diagnosis not present

## 2017-11-05 DIAGNOSIS — Z923 Personal history of irradiation: Secondary | ICD-10-CM | POA: Diagnosis not present

## 2017-11-05 DIAGNOSIS — K5730 Diverticulosis of large intestine without perforation or abscess without bleeding: Secondary | ICD-10-CM

## 2017-11-05 DIAGNOSIS — Z9221 Personal history of antineoplastic chemotherapy: Secondary | ICD-10-CM | POA: Diagnosis not present

## 2017-11-05 DIAGNOSIS — R911 Solitary pulmonary nodule: Secondary | ICD-10-CM | POA: Diagnosis not present

## 2017-11-05 DIAGNOSIS — M129 Arthropathy, unspecified: Secondary | ICD-10-CM

## 2017-11-05 DIAGNOSIS — E871 Hypo-osmolality and hyponatremia: Secondary | ICD-10-CM | POA: Diagnosis not present

## 2017-11-05 DIAGNOSIS — R1310 Dysphagia, unspecified: Secondary | ICD-10-CM

## 2017-11-05 DIAGNOSIS — E039 Hypothyroidism, unspecified: Secondary | ICD-10-CM

## 2017-11-05 DIAGNOSIS — I2510 Atherosclerotic heart disease of native coronary artery without angina pectoris: Secondary | ICD-10-CM | POA: Diagnosis not present

## 2017-11-05 DIAGNOSIS — C155 Malignant neoplasm of lower third of esophagus: Secondary | ICD-10-CM | POA: Diagnosis not present

## 2017-11-05 DIAGNOSIS — Z79899 Other long term (current) drug therapy: Secondary | ICD-10-CM | POA: Diagnosis not present

## 2017-11-05 DIAGNOSIS — Z5112 Encounter for antineoplastic immunotherapy: Secondary | ICD-10-CM | POA: Diagnosis not present

## 2017-11-05 NOTE — Assessment & Plan Note (Addendum)
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 PET- positive paraesophgeal uptake.Txn1- stage III. S/p  concurrent chemoradiation- carbo-taxol weekly [finished May 21st 2018].   # Nov 2018 PET scan-improved primary esophagus malignancy; however mediastinal lymph node-new uptakeconcerning for recurrence.  Discussed multiple options with the patient-regarding radiation [reluctant]; discussed regarding need for possible biopsy to confirm recurrence of the disease; proceeding with systemic therapy-immunotherapy versus chemotherapy.   #Discussed with the patient multiple options-the potential mechanism of action/ side effects of chemotherapy versus immunotherapy in detail.  She understands all treatments are palliative; not curative; and also indefinite.   Discussed the potential side effects including but not limited to-increasing fatigue, nausea vomiting, diarrhea, hair loss, sores in the mouth, increase risk of infection and also neuropathy.    I discussed the mechanism of action; The goal of therapy is palliative; and length of treatments are likely ongoing/based upon the results of the scans. Discussed the potential side effects of immunotherapy including but not limited to diarrhea; skin rash; elevated LFTs/endocrine abnormalities etc.  #I suspect poor tolerance to Taxol-Cyramza; recommend Keytruda patient is PD L1 positive.  Discussed 20% or so. Again; non-curative.   # Difficulty swallowing - second malignancy/radiation- status post dilatation x3- slightly  Improved. Again in October 2018.  # History of hypertension- elevated in 200s; as per patient at home currently blood pressure is 130s to 140s. She states to follow up with PCP/Dr.Tullo,   # Left lower lobe centimeter lung nodule- question etiology. slightly smaller. Monitor for now  # Left renal mass- suspicious for malignancy.  Stable on most recent PET scan.  # Chronic elevation for alk phos as per patient.  #We will  reviewed the imaging at the tumor conference; if there is need for endoscopic ultrasound/biopsy.  # 40 minutes face-to-face with the patient discussing the above plan of care; more than 50% of time spent on prognosis/ natural history; counseling and coordination.  # I reviewed the blood work- with the patient in detail; also reviewed the imaging independently [as summarized above]; and with the patient in detail.     cc; Dr.Lateef; Dr.Tullo; Dr. Allen Norris

## 2017-11-05 NOTE — Progress Notes (Signed)
Patient here for f/u for esophageal cancer. Patient had labs drawn at [place]. She reports improvement in her appetite. Denies any N&VD.

## 2017-11-05 NOTE — Progress Notes (Signed)
[place] NOTE  Patient Care Team: Crecencio Mc, MD as PCP - General (Internal Medicine) Clent Jacks, RN as Registered Nurse Anthonette Legato, MD (Internal Medicine)  CHIEF COMPLAINTS/PURPOSE OF CONSULTATION: Esophageal cancer  #  Oncology History   # MARCH 2018- ADENO CA; mod diff [lower third of esophagus; EGD Bx; Dr.Wohl]; CT- A/P- no distant mets/LN. PET- TxN1; no EUS.  # April 5th 2018- Carbo-taxol with RT [until May 22nd 2018]; July 12h PET-improved.   # NOV 5th PET- Progression in mediastinum   # Dysphagia- s/p EGD [Dr. Vicente Males; July 2018]; Repeat EGD [Dr.Wohl] s/p dilatation.  # March 2018- LEFT KIDNEY COMPLEX MASS ~2cm [incidental]; ~ 2 cm left lower lobe nodule [question second primary]  # Hx of Hyponatremia [[phone]; Dr.Lateef]  # MOLECULAR TESTING- Her 2 neu-NEGATIVE; PDL-1- POSITIVE [CPS-2]; MMR- STABLE.      Malignant neoplasm of lower third of esophagus (HCC)     HISTORY OF PRESENTING ILLNESS:  Brittney Jackson 83 y.o.  female diagnosed adenocarcinoma the Lower esophagus currently s/p  chemoradiation is here for follow-up [finished RT on may 21st] Is here for follow-up/reviewed the results of her restaging PET scan.  In the interim patient underwent endoscopy-dilatation for difficult swallowing.  October 2018- reviewed of endoscopy report- no obvious evidence of recurrence. No biopsy taken.  She feels her swallowing is improved since dilatation; Denies any shortness of breath or chest pain. No new cough.    ROS: A complete 10 point review of system is done which is negative except mentioned above in history of present illness  MEDICAL HISTORY:  Past Medical History:  Diagnosis Date  . Arthritis    left knee  . Cancer ([place])    esophageal cancer  . Diverticulitis   . Dysphagia   . Function kidney decreased   . Hypertension   . Hypothyroidism   . Inappropriate ADH secretion (HCC)   . Low sodium levels   . Osteoporosis    . Renal disorder     SURGICAL HISTORY: Past Surgical History:  Procedure Laterality Date  . REFRACTIVE SURGERY    . 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 Family is in the area.  Social History   Socioeconomic History  . Marital status: Widowed    Spouse name: Not on file  . Number of children: Not on file  . Years of education: Not on file  . Highest education level: Not on file  Social Needs  . Financial resource strain: Not on file  . Food insecurity - worry: Not on file  . Food insecurity - inability: Not on file  . Transportation needs - medical: Not on file  . Transportation needs - non-medical: Not on file  Occupational History  . 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 Smoking status: Former Smoker    Types: Cigarettes    Last attempt to quit: 07/21/2000    Years since quitting: 17.3  . Smokeless tobacco: Never Used  Substance and Sexual Activity  . Alcohol use: No  . Drug use: No  . Sexual activity: No  Other Topics Concern  . Not on file  Social History Narrative   Church activities, community service.   Lives alone.    FAMILY HISTORY: Family History  Problem Relation Age of Onset  . Heart attack Mother   . Cancer Maternal Uncle  renal cancer   . COPD Neg Hx        no breast, ovarian or colon    ALLERGIES:  is allergic to amlodipine.  MEDICATIONS:  Current Outpatient Medications  Medication Sig Dispense Refill  . levothyroxine (SYNTHROID, LEVOTHROID) 88 MCG tablet TAKE ONE (1) TABLET EACH DAY 90 tablet 1  . omeprazole (PRILOSEC OTC) 20 MG tablet Take 20 mg by mouth daily. Chew tablets    . sucralfate (CARAFATE) 1 g tablet Take 1 tablet (1 g total) by mouth 4 (four) times daily -  with meals and at bedtime. Dissolve in warm water, swish and swallow 90 tablet 3  . labetalol (NORMODYNE) 100 MG tablet Take 1 tablet (100 mg total) by mouth 3  (three) times daily. (Patient not taking: Reported on 11/05/2017) 60 tablet 0  . potassium chloride SA (K-DUR,KLOR-CON) 20 MEQ tablet Take 1 tablet (20 mEq total) by mouth 2 (two) times daily. (Patient not taking: Reported on 11/05/2017) 30 tablet 3   No current facility-administered medications for this visit.       Marland Kitchen  PHYSICAL EXAMINATION: ECOG PERFORMANCE STATUS: 0 - Asymptomatic  Vitals:   11/05/17 1109  BP: (!) 206/85  Pulse: 85  Resp: 20  Temp: (!) 97.5 F (36.4 C)   There were no vitals filed for this visit.  GENERAL: Well-nourished well-developed; Alert, no distress and comfortable. She is alone. EYES: no pallor or icterus OROPHARYNX: no thrush or ulceration; good dentition  NECK: supple, no masses felt LYMPH:  no palpable lymphadenopathy in the cervical, axillary or inguinal regions LUNGS: clear to auscultation and  No wheeze or crackles HEART/CVS: regular rate & rhythm and no murmurs; no swelling bilateral lower extremities ABDOMEN: abdomen soft, non-tender and normal bowel sounds Musculoskeletal:no cyanosis of digits and no clubbing  PSYCH: alert & oriented x 3 with fluent speech NEURO: no focal motor/sensory deficits SKIN:  no rashes or significant lesions  LABORATORY DATA:  I have reviewed the data as listed Lab Results  Component Value Date   WBC 4.9 09/03/2017   HGB 12.8 09/03/2017   HCT 36.4 09/03/2017   MCV 88.3 09/03/2017   PLT 264 09/03/2017   Recent Labs    06/13/17 1347  07/03/17 1532  07/05/17 1655  07/30/17 0856 08/06/17 1038 09/03/17 0936  NA 130*   < > 131*   < >  --    < > 139 137 133*  K 3.8   < > 3.1*   < >  --    < > 4.0 4.0 4.0  CL 97*   < > 93*   < >  --    < > 104 102 98*  CO2 27   < > 29   < >  --    < > 25 27 26   GLUCOSE 123*   < > 120*   < >  --    < > 147* 157* 108*  BUN 29*   < > 16   < >  --    < > 36* 33* 31*  CREATININE 0.89   < > 0.90   < >  --    < > 1.40* 1.07* 1.08*  CALCIUM 9.1   < > 9.1   < >  --    < > 9.5 9.6  9.4  GFRNONAA 57*   < > 57*   < >  --    < > 33* 46* 45*  GFRAA >60   < > >  60   < >  --    < > 39* 53* 53*  PROT 6.7  --  7.3  --  7.1  --   --   --   --   ALBUMIN 3.2*  --  3.5  --  3.3*  --   --   --   --   AST 24  --  37  --  25  --   --   --   --   ALT 16  --  24  --  20  --   --   --   --   ALKPHOS 95  --  108  --  110  --   --   --   --   BILITOT 0.6  --  0.6  --  0.6  --   --   --   --   BILIDIR  --   --  <0.1*  --  0.1  --   --   --   --   IBILI  --   --  NOT CALCULATED  --  0.5  --   --   --   --    < > = values in this interval not displayed.    RADIOGRAPHIC STUDIES: I have personally reviewed the radiological images as listed and agreed with the findings in the report. Nm Pet Image Restag (ps) Skull Base To Thigh  Result Date: 11/03/2017 CLINICAL DATA:  Subsequent treatment strategy for lower esophageal cancer. EXAM: NUCLEAR MEDICINE PET SKULL BASE TO THIGH TECHNIQUE: 12.9 mCi F-18 FDG was injected intravenously. Full-ring PET imaging was performed from the skull base to thigh after the radiotracer. CT data was obtained and used for attenuation correction and anatomic localization. FASTING BLOOD GLUCOSE:  Value: 100 mg/dl COMPARISON:  Multiple exams, including PET-CT of 07/07/2017 FINDINGS: NECK No hypermetabolic lymph nodes in the neck. Symmetric glottic activity is felt to be physiologic. Atherosclerotic calcification of the carotid arteries noted. CHEST Further reduction in metabolic activity at the site of the prior distal esophageal tumor, previous maximum SUV 5.8, current 3.1 in this vicinity. However, just cephalad to this and below the carina there is a small focus of hypermetabolic activity which could represent a lymph node along the anterior margin of the distal esophagus but difficult to separate from the anterior esophageal wall, maximum SUV 5.3, not previously present. The curvilinear nodule in the left lower lobe is unchanged in size measuring 0.8 by 0.4 cm, no appreciable  metabolic activity currently. There is evidence of old granulomatous disease. Coronary, aortic arch, and branch vessel atherosclerotic vascular disease. Mild lower periesophageal stranding, as before. Mild right lower lobe scarring and mild left lower lobe atelectasis. Mild mosaic attenuation in the lungs. ABDOMEN/PELVIS No abnormal hypermetabolic activity within the liver, pancreas, adrenal glands, or spleen. No hypermetabolic lymph nodes in the abdomen or pelvis. Known enhancing mass posteriorly in the left kidney with metabolic activity similar to the surrounding renal parenchyma enhance versus early occult on today's PET-CT, but based on prior postcontrast imaging the lesion has an appearance suggesting a renal cell carcinoma. Aortoiliac atherosclerotic vascular disease. Sigmoid diverticulosis. SKELETON No focal hypermetabolic activity to suggest skeletal metastasis. IMPRESSION: 1. Further reduction in metabolic activity of the primary tumor site, previous maximum SUV in the distal esophagus 5.8, current 3.1 which is near background mediastinal levels. 2. However, just cephalad to this and below the carina a small focus of hypermetabolic activity with maximum SUV of 5.3 is  new, and could represent a local metastatic lymph node. Surveillance recommended. 3. Known enhancing mass posteriorly in the left kidney, probably a renal cell carcinoma, but extremely inconspicuous on PET-CT due to metabolic activity similar to the surrounding renal parenchyma. 4. Stable small nodule in the left lower lobe, no appreciable metabolic activity, average size 0.6 cm in diameter which is below sensitive PET-CT size thresholds. 5. Mosaic attenuation in the lungs is technically nonspecific and can be seen in a variety of conditions including asthma, bronchiolitis obliterans, hypersensitivity pneumonitis, and in the setting of chronic pulmonary embolism. PET-CT is not suited to assessing subtle parenchymal patterns in the lung because  the CT images are obtained with the patient free breathing which introduces motion artifact. 6. Other imaging findings of potential clinical significance: Aortic Atherosclerosis (ICD10-I70.0). Coronary atherosclerosis. Sigmoid diverticulosis. Electronically Signed   By: Van Clines M.D.   On: 11/03/2017 13:23    ASSESSMENT & PLAN:   Malignant neoplasm of lower third of esophagus ([place]) # Moderate differentiated adenocarcinoma of the lower esophagus. PET- positive paraesophgeal uptake.Txn1- stage III. S/p  concurrent chemoradiation- carbo-taxol weekly [finished May 21st 2018].   # Nov 2018 PET scan-improved primary esophagus malignancy; however mediastinal lymph node-new uptakeconcerning for recurrence.  Discussed multiple options with the patient-regarding radiation [reluctant]; discussed regarding need for possible biopsy to confirm recurrence of the disease; proceeding with systemic therapy-immunotherapy versus chemotherapy.   #Discussed with the patient multiple options-the potential mechanism of action/ side effects of chemotherapy versus immunotherapy in detail.  She understands all treatments are palliative; not curative; and also indefinite.   Discussed the potential side effects including but not limited to-increasing fatigue, nausea vomiting, diarrhea, hair loss, sores in the mouth, increase risk of infection and also neuropathy.    I discussed the mechanism of action; The goal of therapy is palliative; and length of treatments are likely ongoing/based upon the results of the scans. Discussed the potential side effects of immunotherapy including but not limited to diarrhea; skin rash; elevated LFTs/endocrine abnormalities etc.  #I suspect poor tolerance to Taxol-Cyramza; recommend Keytruda patient is PD L1 positive.  Discussed 20% or so. Again; non-curative.   # Difficulty swallowing - second malignancy/radiation- status post dilatation x3- slightly  Improved. Again in October  2018.  # History of hypertension- elevated in 200s; as per patient at home currently blood pressure is 130s to 140s. She states to follow up with PCP/Dr.Tullo,   # Left lower lobe centimeter lung nodule- question etiology. slightly smaller. Monitor for now  # Left renal mass- suspicious for malignancy.  Stable on most recent PET scan.  # Chronic elevation for alk phos as per patient.  #We will reviewed the imaging at the tumor conference; if there is need for endoscopic ultrasound/biopsy.  # 40 minutes face-to-face with the patient discussing the above plan of care; more than 50% of time spent on prognosis/ natural history; counseling and coordination.  # I reviewed the blood work- with the patient in detail; also reviewed the imaging independently [as summarized above]; and with the patient in detail.     cc; Dr.Lateef; Dr.Tullo; Dr. Laurel Dimmer, MD 11/08/2017 1:56 PM

## 2017-11-08 NOTE — Progress Notes (Signed)
DISCONTINUE ON PATHWAY REGIMEN - Gastroesophageal     Administer weekly during RT:     Paclitaxel      Carboplatin   **Always confirm dose/schedule in your pharmacy ordering system**    REASON: Disease Progression PRIOR TREATMENT: [id number]: Carboplatin + Paclitaxel (2/50) Weekly (x 5-6 Weeks) with Concurrent RT TREATMENT RESPONSE: Progressive Disease (PD)  START ON PATHWAY REGIMEN - Gastroesophageal     Administer once every 2 weeks:     Ramucirumab   **Always confirm dose/schedule in your pharmacy ordering system**    Patient Characteristics: Distant Metastases (cM1/pM1) / Locally Recurrent Disease, Adenocarcinoma - Esophageal, [place], and Gastric, Second Line, MSS / pMMR or MSI Unknown Histology: Adenocarcinoma Disease Classification: GE Junction Therapeutic Status: Local Recurrence (No Additional Staging) Would you be surprised if this patient died  in the next year<= I would be surprised if this patient died in the next year Line of Therapy: Second Line Microsatellite/Mismatch Repair Status: MSS/pMMR Intent of Therapy: Non-Curative / Palliative Intent, Discussed with Patient

## 2017-11-11 ENCOUNTER — Telehealth: Payer: Self-pay | Admitting: Internal Medicine

## 2017-11-11 NOTE — Telephone Encounter (Signed)
Shawn/Kristie- Spoke to pt's daughter re: plan discussed at tumor conference.  Recommend holding CT scan with contrast given borderline renal function.  Spoke to Dr. Allen Norris.   Shawn- Refer to [facility] EUS team for mediastinal LN biopsy. Please push the app/infusion plan to 3-4 days after the biopsy.

## 2017-11-15 ENCOUNTER — Telehealth: Payer: Self-pay

## 2017-11-15 NOTE — Telephone Encounter (Signed)
  Oncology Nurse Navigator Documentation Brittney Jackson, I just spoke with Brittney Jackson's daughter, [name]. They have decided to keep their appointment at [facility] since it is a little sooner. Thanks for for your assistance. Navigator Location: [facility] (11/15/17 1000)   )Navigator Encounter Type: Telephone (11/15/17 1000) Telephone: Lahoma Crocker Call;Appt Confirmation/Clarification (11/15/17 1000)                                                  Time Spent with Patient: 15 (11/15/17 1000)

## 2017-11-15 NOTE — Telephone Encounter (Signed)
Ok thank you.  We are here if you need [place].

## 2017-11-15 NOTE — Telephone Encounter (Signed)
Dr Ardis Hughs the pt has decided to go to [facility] for her EUS.

## 2017-11-15 NOTE — Telephone Encounter (Signed)
-----   Message from Milus Banister, MD sent at 11/14/2017  8:22 AM EST ----- Regarding: RE: please review for EUS Shawn, Happy to help.    Astin Sayre, She needs upper EUS, radial +/- linear, Thursday 29th with MAC sedation.  For esophageal cancer, mediastinal LN  Thanks  ----- Message ----- From: Lieutenant Diego, RN Sent: 11/12/2017   1:37 PM To: Milus Banister, MD Subject: please review for EUS                          Dr. Ardis Hughs, Please review imaging for [facility] for potential eus bx of mediastinal lymph node.   Patient is being scheduled at [facility] but prefers to have procedure with you if possible the week after Thanksgiving.  Thanks, [employer]

## 2017-11-22 DIAGNOSIS — K228 Other specified diseases of esophagus: Secondary | ICD-10-CM | POA: Diagnosis not present

## 2017-11-22 DIAGNOSIS — K222 Esophageal obstruction: Secondary | ICD-10-CM | POA: Diagnosis not present

## 2017-11-22 DIAGNOSIS — R933 Abnormal findings on diagnostic imaging of other parts of digestive tract: Secondary | ICD-10-CM | POA: Diagnosis not present

## 2017-11-22 DIAGNOSIS — Z87891 Personal history of nicotine dependence: Secondary | ICD-10-CM | POA: Diagnosis not present

## 2017-11-22 DIAGNOSIS — C159 Malignant neoplasm of esophagus, unspecified: Secondary | ICD-10-CM | POA: Diagnosis not present

## 2017-11-22 DIAGNOSIS — Z08 Encounter for follow-up examination after completed treatment for malignant neoplasm: Secondary | ICD-10-CM | POA: Diagnosis not present

## 2017-11-22 DIAGNOSIS — R935 Abnormal findings on diagnostic imaging of other abdominal regions, including retroperitoneum: Secondary | ICD-10-CM | POA: Diagnosis not present

## 2017-11-22 DIAGNOSIS — C771 Secondary and unspecified malignant neoplasm of intrathoracic lymph nodes: Secondary | ICD-10-CM | POA: Diagnosis not present

## 2017-11-22 DIAGNOSIS — Z79899 Other long term (current) drug therapy: Secondary | ICD-10-CM | POA: Diagnosis not present

## 2017-11-24 ENCOUNTER — Inpatient Hospital Stay: Payer: Medicare Other

## 2017-11-24 ENCOUNTER — Inpatient Hospital Stay: Payer: Medicare Other | Admitting: Internal Medicine

## 2017-11-24 NOTE — Progress Notes (Deleted)
[place] NOTE  Patient Care Team: Crecencio Mc, MD as PCP - General (Internal Medicine) Clent Jacks, RN as Registered Nurse Anthonette Legato, MD (Internal Medicine)  CHIEF COMPLAINTS/PURPOSE OF CONSULTATION: Esophageal cancer  #  Oncology History   # MARCH 2018- ADENO CA; mod diff [lower third of esophagus; EGD Bx; Dr.Wohl]; CT- A/P- no distant mets/LN. PET- TxN1; no EUS.  # April 5th 2018- Carbo-taxol with RT [until May 22nd 2018]; July 12h PET-improved.   # NOV 5th PET- Progression in mediastinum   # Dysphagia- s/p EGD [Dr. Vicente Males; July 2018]; Repeat EGD [Dr.Wohl] s/p dilatation.  # March 2018- LEFT KIDNEY COMPLEX MASS ~2cm [incidental]; ~ 2 cm left lower lobe nodule [question second primary]  # Hx of Hyponatremia [[phone]; Dr.Lateef]  # MOLECULAR TESTING- Her 2 neu-NEGATIVE; PDL-1- POSITIVE [CPS-2]; MMR- STABLE.      Malignant neoplasm of lower third of esophagus (HCC)     HISTORY OF PRESENTING ILLNESS:  Brittney Jackson 83 y.o.  female diagnosed adenocarcinoma the Lower esophagus currently s/p  chemoradiation is here for follow-up [finished RT on may 21st] Is here for follow-up.  In the interim patient underwent endoscopy-dilatation for difficult swallowing. Review of endoscopy report- no obvious evidence of recurrence. No biopsy taken.  She feels her swallowing is improved since dilatation;  She states that she has been taking boost up to 2 a day.  Denies any shortness of breath or chest pain. No new cough.  Patient notes to have some difficulty with vision blurriness. No double vision. She is awaiting ophthalmology evaluation for possible cataracts.  ROS: A complete 10 point review of system is done which is negative except mentioned above in history of present illness  MEDICAL HISTORY:  Past Medical History:  Diagnosis Date  . Arthritis    left knee  . Cancer ([place])    esophageal cancer  . Diverticulitis   . Dysphagia   .  Function kidney decreased   . Hypertension   . Hypothyroidism   . Inappropriate ADH secretion (HCC)   . Low sodium levels   . Osteoporosis   . Renal disorder     SURGICAL HISTORY: Past Surgical History:  Procedure Laterality Date  . CATARACT EXTRACTION W/PHACO Left 05/29/2015   Procedure: CATARACT EXTRACTION PHACO AND INTRAOCULAR LENS PLACEMENT (IOC);  Surgeon: Leandrew Koyanagi, MD;  Location: [place];  Service: Ophthalmology;  Laterality: Left;  . CATARACT EXTRACTION W/PHACO Right 01/15/2016   Procedure: CATARACT EXTRACTION PHACO AND INTRAOCULAR LENS PLACEMENT (IOC);  Surgeon: Leandrew Koyanagi, MD;  Location: [place];  Service: Ophthalmology;  Laterality: Right;  SHUGARCAINE  . ESOPHAGOGASTRODUODENOSCOPY (EGD) WITH PROPOFOL N/A 03/16/2017   Procedure: ESOPHAGOGASTRODUODENOSCOPY (EGD) WITH PROPOFOL;  Surgeon: [name], MD;  Location: [facility] END[facility]COPY;  Service: Endoscopy;  Laterality: N/A;  . ESOPHAGOGASTRODUODENOSCOPY (EGD) WITH PROPOFOL N/A 06/25/2017   Procedure: ESOPHAGOGASTRODUODENOSCOPY (EGD) WITH PROPOFOL;  Surgeon: Jonathon Bellows, MD;  Location: [facility]COPY;  Service: Endoscopy;  Laterality: N/A;  . ESOPHAGOGASTRODUODENOSCOPY (EGD) WITH PROPOFOL N/A 07/14/2017   Procedure: ESOPHAGOGASTRODUODENOSCOPY (EGD) WITH PROPOFOL with dilation;  Surgeon: [name], MD;  Location: [facility];  Service: Endoscopy;  Laterality: N/A;  . ESOPHAGOGASTRODUODENOSCOPY (EGD) WITH PROPOFOL N/A 08/31/2017   Procedure: ESOPHAGOGASTRODUODENOSCOPY (EGD) WITH PROPOFOL;  Surgeon: [name], MD;  Location: [facility];  Service: Endoscopy;  Laterality: N/A;  . ESOPHAGOGASTRODUODENOSCOPY (EGD) WITH PROPOFOL N/A 09/21/2017   Procedure: ESOPHAGOGASTRODUODENOSCOPY (EGD) WITH PROPOFOL;  Surgeon: [name], MD;  Location: [facility];  Service:  Endoscopy;  Laterality: N/A;  . ESOPHAGOGASTRODUODENOSCOPY (EGD) WITH PROPOFOL N/A 10/12/2017   Procedure: ESOPHAGOGASTRODUODENOSCOPY  (EGD) WITH PROPOFOL;  Surgeon: [name], MD;  Location: [facility] ENDOS[facility];  Service: Endoscopy;  Laterality: N/A;  . REFRACTIVE SURGERY    . 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 Family is in the area.  Social History   Socioeconomic History  . Marital status: Widowed    Spouse name: Not on file  . Number of children: Not on file  . Years of education: Not on file  . Highest education level: Not on file  Social Needs  . Financial resource strain: Not on file  . Food insecurity - worry: Not on file  . Food insecurity - inability: Not on file  . Transportation needs - medical: Not on file  . Transportation needs - non-medical: Not on file  Occupational History  . 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 Smoking status: Former Smoker    Types: Cigarettes    Last attempt to quit: 07/21/2000    Years since quitting: 17.3  . Smokeless tobacco: Never Used  Substance and Sexual Activity  . Alcohol use: No  . Drug use: No  . Sexual activity: No  Other Topics Concern  . Not on file  Social History Narrative   Church activities, community service.   Lives alone.    FAMILY HISTORY: Family History  Problem Relation Age of Onset  . Heart attack Mother   . Cancer Maternal Uncle        renal cancer   . COPD Neg Hx        no breast, ovarian or colon    ALLERGIES:  is allergic to amlodipine.  MEDICATIONS:  Current Outpatient Medications  Medication Sig Dispense Refill  . labetalol (NORMODYNE) 100 MG tablet Take 1 tablet (100 mg total) by mouth 3 (three) times daily. (Patient not taking: Reported on 11/05/2017) 60 tablet 0  . levothyroxine (SYNTHROID, LEVOTHROID) 88 MCG tablet TAKE ONE (1) TABLET EACH DAY 90 tablet 1  . omeprazole (PRILOSEC OTC) 20 MG tablet Take 20 mg by mouth daily. Chew tablets    . potassium chloride SA (K-DUR,KLOR-CON) 20 MEQ tablet Take 1 tablet (20 mEq  total) by mouth 2 (two) times daily. (Patient not taking: Reported on 11/05/2017) 30 tablet 3  . sucralfate (CARAFATE) 1 g tablet Take 1 tablet (1 g total) by mouth 4 (four) times daily -  with meals and at bedtime. Dissolve in warm water, swish and swallow 90 tablet 3   No current facility-administered medications for this visit.       Marland Kitchen  PHYSICAL EXAMINATION: ECOG PERFORMANCE STATUS: 0 - Asymptomatic  There were no vitals filed for this visit. There were no vitals filed for this visit.  GENERAL: Well-nourished well-developed; Alert, no distress and comfortable. She is alone. EYES: no pallor or icterus OROPHARYNX: no thrush or ulceration; good dentition  NECK: supple, no masses felt LYMPH:  no palpable lymphadenopathy in the cervical, axillary or inguinal regions LUNGS: clear to auscultation and  No wheeze or crackles HEART/CVS: regular rate & rhythm and no murmurs; no swelling bilateral lower extremities ABDOMEN: abdomen soft, non-tender and normal bowel sounds Musculoskeletal:no cyanosis of digits and no clubbing  PSYCH: alert & oriented x 3 with fluent speech NEURO: no focal motor/sensory deficits SKIN:  no rashes or significant lesions  LABORATORY DATA:  I have reviewed  the data as listed Lab Results  Component Value Date   WBC 4.9 09/03/2017   HGB 12.8 09/03/2017   HCT 36.4 09/03/2017   MCV 88.3 09/03/2017   PLT 264 09/03/2017   Recent Labs    06/13/17 1347  07/03/17 1532  07/05/17 1655  07/30/17 0856 08/06/17 1038 09/03/17 0936  NA 130*   < > 131*   < >  --    < > 139 137 133*  K 3.8   < > 3.1*   < >  --    < > 4.0 4.0 4.0  CL 97*   < > 93*   < >  --    < > 104 102 98*  CO2 27   < > 29   < >  --    < > _0 GLUCOSE 123*   < > 120*   < >  --    < > 147* 157* 108*  BUN 29*   < > 16   < >  --    < > 36* 33* 31*  CREATININE 0.89   < > 0.90   < >  --    < > 1.40* 1.07* 1.08*  CALCIUM 9.1   < > 9.1   < >  --    < > 9.5 9.6 9.4  GFRNONAA 57*   < > 57*   < >   --    < > 33* 46* 45*  GFRAA >60   < > >60   < >  --    < > 39* 53* 53*  PROT 6.7  --  7.3  --  7.1  --   --   --   --   ALBUMIN 3.2*  --  3.5  --  3.3*  --   --   --   --   AST 24  --  37  --  25  --   --   --   --   ALT 16  --  24  --  20  --   --   --   --   ALKPHOS 95  --  108  --  110  --   --   --   --   BILITOT 0.6  --  0.6  --  0.6  --   --   --   --   BILIDIR  --   --  <0.1*  --  0.1  --   --   --   --   IBILI  --   --  NOT CALCULATED  --  0.5  --   --   --   --    < > = values in this interval not displayed.    RADIOGRAPHIC STUDIES: I have personally reviewed the radiological images as listed and agreed with the findings in the report. Nm Pet Image Restag (ps) Skull Base To Thigh  Result Date: 11/03/2017 CLINICAL DATA:  Subsequent treatment strategy for lower esophageal cancer. EXAM: NUCLEAR MEDICINE PET SKULL BASE TO THIGH TECHNIQUE: 12.9 mCi F-18 FDG was injected intravenously. Full-ring PET imaging was performed from the skull base to thigh after the radiotracer. CT data was obtained and used for attenuation correction and anatomic localization. FASTING BLOOD GLUCOSE:  Value: 100 mg/dl COMPARISON:  Multiple exams, including PET-CT of 07/07/2017 FINDINGS: NECK No hypermetabolic lymph nodes in the neck. Symmetric glottic activity is felt to be physiologic. Atherosclerotic calcification of  the carotid arteries noted. CHEST Further reduction in metabolic activity at the site of the prior distal esophageal tumor, previous maximum SUV 5.8, current 3.1 in this vicinity. However, just cephalad to this and below the carina there is a small focus of hypermetabolic activity which could represent a lymph node along the anterior margin of the distal esophagus but difficult to separate from the anterior esophageal wall, maximum SUV 5.3, not previously present. The curvilinear nodule in the left lower lobe is unchanged in size measuring 0.8 by 0.4 cm, no appreciable metabolic activity currently. There  is evidence of old granulomatous disease. Coronary, aortic arch, and branch vessel atherosclerotic vascular disease. Mild lower periesophageal stranding, as before. Mild right lower lobe scarring and mild left lower lobe atelectasis. Mild mosaic attenuation in the lungs. ABDOMEN/PELVIS No abnormal hypermetabolic activity within the liver, pancreas, adrenal glands, or spleen. No hypermetabolic lymph nodes in the abdomen or pelvis. Known enhancing mass posteriorly in the left kidney with metabolic activity similar to the surrounding renal parenchyma enhance versus early occult on today's PET-CT, but based on prior postcontrast imaging the lesion has an appearance suggesting a renal cell carcinoma. Aortoiliac atherosclerotic vascular disease. Sigmoid diverticulosis. SKELETON No focal hypermetabolic activity to suggest skeletal metastasis. IMPRESSION: 1. Further reduction in metabolic activity of the primary tumor site, previous maximum SUV in the distal esophagus 5.8, current 3.1 which is near background mediastinal levels. 2. However, just cephalad to this and below the carina a small focus of hypermetabolic activity with maximum SUV of 5.3 is new, and could represent a local metastatic lymph node. Surveillance recommended. 3. Known enhancing mass posteriorly in the left kidney, probably a renal cell carcinoma, but extremely inconspicuous on PET-CT due to metabolic activity similar to the surrounding renal parenchyma. 4. Stable small nodule in the left lower lobe, no appreciable metabolic activity, average size 0.6 cm in diameter which is below sensitive PET-CT size thresholds. 5. Mosaic attenuation in the lungs is technically nonspecific and can be seen in a variety of conditions including asthma, bronchiolitis obliterans, hypersensitivity pneumonitis, and in the setting of chronic pulmonary embolism. PET-CT is not suited to assessing subtle parenchymal patterns in the lung because the CT images are obtained with the  patient free breathing which introduces motion artifact. 6. Other imaging findings of potential clinical significance: Aortic Atherosclerosis (ICD10-I70.0). Coronary atherosclerosis. Sigmoid diverticulosis. Electronically Signed   By: Van Clines M.D.   On: 11/03/2017 13:23    ASSESSMENT & PLAN:   No problem-specific Assessment & Plan notes found for this encounter.     Cammie Sickle, MD 11/24/2017 8:35 AM

## 2017-11-24 NOTE — Assessment & Plan Note (Deleted)
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 PET- positive paraesophgeal uptake.Txn1- stage III. S/p  concurrent chemoradiation- carbo-taxol weekly [finished May 21st 2018].   # Nov 2018 PET scan-improved primary esophagus malignancy; however mediastinal lymph node-new uptakeconcerning for recurrence.  Discussed multiple options with the patient-regarding radiation [reluctant]; discussed regarding need for possible biopsy to confirm recurrence of the disease; proceeding with systemic therapy-immunotherapy versus chemotherapy.   #Discussed with the patient multiple options-the potential mechanism of action/ side effects of chemotherapy versus immunotherapy in detail.  She understands all treatments are palliative; not curative; and also indefinite.   Discussed the potential side effects including but not limited to-increasing fatigue, nausea vomiting, diarrhea, hair loss, sores in the mouth, increase risk of infection and also neuropathy.    I discussed the mechanism of action; The goal of therapy is palliative; and length of treatments are likely ongoing/based upon the results of the scans. Discussed the potential side effects of immunotherapy including but not limited to diarrhea; skin rash; elevated LFTs/endocrine abnormalities etc.  #I suspect poor tolerance to Taxol-Cyramza; recommend Keytruda patient is PD L1 positive.  Discussed 20% or so. Again; non-curative.   # Difficulty swallowing - second malignancy/radiation- status post dilatation x3- slightly  Improved. Again in October 2018.  # History of hypertension- elevated in 200s; as per patient at home currently blood pressure is 130s to 140s. She states to follow up with PCP/Dr.Tullo,   # Left lower lobe centimeter lung nodule- question etiology. slightly smaller. Monitor for now  # Left renal mass- suspicious for malignancy.  Stable on most recent PET scan.  # Chronic elevation for alk phos as per patient.  #We will  reviewed the imaging at the tumor conference; if there is need for endoscopic ultrasound/biopsy.  # 40 minutes face-to-face with the patient discussing the above plan of care; more than 50% of time spent on prognosis/ natural history; counseling and coordination.  # I reviewed the blood work- with the patient in detail; also reviewed the imaging independently [as summarized above]; and with the patient in detail.     cc; Dr.Lateef; Dr.Tullo; Dr. Allen Norris

## 2017-11-26 ENCOUNTER — Other Ambulatory Visit: Payer: Medicare Other

## 2017-11-26 ENCOUNTER — Inpatient Hospital Stay (HOSPITAL_BASED_OUTPATIENT_CLINIC_OR_DEPARTMENT_OTHER): Payer: Medicare Other | Admitting: Internal Medicine

## 2017-11-26 ENCOUNTER — Ambulatory Visit: Payer: Medicare Other

## 2017-11-26 ENCOUNTER — Inpatient Hospital Stay: Payer: Medicare Other

## 2017-11-26 ENCOUNTER — Ambulatory Visit: Payer: Medicare Other | Admitting: Internal Medicine

## 2017-11-26 VITALS — BP 160/81 | HR 88 | Temp 97.7°F | Resp 16 | Wt 128.6 lb

## 2017-11-26 DIAGNOSIS — M129 Arthropathy, unspecified: Secondary | ICD-10-CM

## 2017-11-26 DIAGNOSIS — Z5112 Encounter for antineoplastic immunotherapy: Secondary | ICD-10-CM | POA: Diagnosis not present

## 2017-11-26 DIAGNOSIS — I10 Essential (primary) hypertension: Secondary | ICD-10-CM | POA: Diagnosis not present

## 2017-11-26 DIAGNOSIS — Z9221 Personal history of antineoplastic chemotherapy: Secondary | ICD-10-CM

## 2017-11-26 DIAGNOSIS — C155 Malignant neoplasm of lower third of esophagus: Secondary | ICD-10-CM

## 2017-11-26 DIAGNOSIS — E871 Hypo-osmolality and hyponatremia: Secondary | ICD-10-CM | POA: Diagnosis not present

## 2017-11-26 DIAGNOSIS — R948 Abnormal results of function studies of other organs and systems: Secondary | ICD-10-CM

## 2017-11-26 DIAGNOSIS — I2510 Atherosclerotic heart disease of native coronary artery without angina pectoris: Secondary | ICD-10-CM

## 2017-11-26 DIAGNOSIS — E222 Syndrome of inappropriate secretion of antidiuretic hormone: Secondary | ICD-10-CM | POA: Diagnosis not present

## 2017-11-26 DIAGNOSIS — R911 Solitary pulmonary nodule: Secondary | ICD-10-CM | POA: Diagnosis not present

## 2017-11-26 DIAGNOSIS — M810 Age-related osteoporosis without current pathological fracture: Secondary | ICD-10-CM | POA: Diagnosis not present

## 2017-11-26 DIAGNOSIS — K5730 Diverticulosis of large intestine without perforation or abscess without bleeding: Secondary | ICD-10-CM | POA: Diagnosis not present

## 2017-11-26 DIAGNOSIS — E039 Hypothyroidism, unspecified: Secondary | ICD-10-CM | POA: Diagnosis not present

## 2017-11-26 DIAGNOSIS — N2889 Other specified disorders of kidney and ureter: Secondary | ICD-10-CM

## 2017-11-26 DIAGNOSIS — R1310 Dysphagia, unspecified: Secondary | ICD-10-CM | POA: Diagnosis not present

## 2017-11-26 DIAGNOSIS — Z923 Personal history of irradiation: Secondary | ICD-10-CM

## 2017-11-26 DIAGNOSIS — Z79899 Other long term (current) drug therapy: Secondary | ICD-10-CM

## 2017-11-26 LAB — CBC WITH DIFFERENTIAL/PLATELET
BASOS ABS: 0.1 10*3/uL (ref 0–0.1)
Basophils Relative: 1 %
EOS PCT: 3 %
Eosinophils Absolute: 0.2 10*3/uL (ref 0–0.7)
HCT: 38.4 % (ref 35.0–47.0)
Hemoglobin: 12.8 g/dL (ref 12.0–16.0)
LYMPHS PCT: 28 %
Lymphs Abs: 1.5 10*3/uL (ref 1.0–3.6)
MCH: 29.1 pg (ref 26.0–34.0)
MCHC: 33.2 g/dL (ref 32.0–36.0)
MCV: 87.5 fL (ref 80.0–100.0)
MONO ABS: 0.7 10*3/uL (ref 0.2–0.9)
MONOS PCT: 14 %
Neutro Abs: 2.9 10*3/uL (ref 1.4–6.5)
Neutrophils Relative %: 54 %
PLATELETS: 251 10*3/uL (ref 150–440)
RBC: 4.39 MIL/uL (ref 3.80–5.20)
RDW: 14.1 % (ref 11.5–14.5)
WBC: 5.4 10*3/uL (ref 3.6–11.0)

## 2017-11-26 LAB — COMPREHENSIVE METABOLIC PANEL
ALBUMIN: 3.8 g/dL (ref 3.5–5.0)
ALK PHOS: 134 U/L — AB (ref 38–126)
ALT: 16 U/L (ref 14–54)
AST: 23 U/L (ref 15–41)
Anion gap: 9 (ref 5–15)
BILIRUBIN TOTAL: 0.6 mg/dL (ref 0.3–1.2)
BUN: 27 mg/dL — AB (ref 6–20)
CO2: 25 mmol/L (ref 22–32)
Calcium: 9.3 mg/dL (ref 8.9–10.3)
Chloride: 103 mmol/L (ref 101–111)
Creatinine, Ser: 1.11 mg/dL — ABNORMAL HIGH (ref 0.44–1.00)
GFR calc Af Amer: 51 mL/min — ABNORMAL LOW (ref >60)
GFR calc non Af Amer: 44 mL/min — ABNORMAL LOW (ref >60)
GLUCOSE: 112 mg/dL — AB (ref 65–99)
POTASSIUM: 3.8 mmol/L (ref 3.5–5.1)
Sodium: 137 mmol/L (ref 135–145)
TOTAL PROTEIN: 7.2 g/dL (ref 6.5–8.1)

## 2017-11-26 LAB — TSH: TSH: 3.848 u[IU]/mL (ref 0.350–4.500)

## 2017-11-26 MED ORDER — SODIUM CHLORIDE 0.9 % IV SOLN
Freq: Once | INTRAVENOUS | Status: AC
  Administered 2017-11-26: 15:00:00 via INTRAVENOUS
  Filled 2017-11-26: qty 1000

## 2017-11-26 MED ORDER — SODIUM CHLORIDE 0.9 % IV SOLN
200.0000 mg | Freq: Once | INTRAVENOUS | Status: AC
  Administered 2017-11-26: 200 mg via INTRAVENOUS
  Filled 2017-11-26: qty 8

## 2017-11-26 NOTE — Assessment & Plan Note (Addendum)
#  Recurrent/metastatic adenocarcinoma of the esophagus-mediastinal lymph node status post biopsy-positive for metastatic adenocarcinoma.  #Proceed with Keytruda No. 1 today.  I again discussed 20% or so. Again; non-curative.    I discussed the mechanism of action; The goal of therapy is palliative; and length of treatments are likely ongoing/based upon the results of the scans.   Discussed the potential side effects of immunotherapy including but not limited to diarrhea; skin rash; elevated LFTs/endocrine abnormalities etc.  # Difficulty swallowing - second malignancy/radiation- status post dilatation x3- slightly  Improved. Again in October 2018.  # History of hypertension- elevated in 160ss; as per patient at home currently blood pressure is 130s to 140s.  Follow-up with PCP  # Left lower lobe centimeter lung nodule- question etiology. slightly smaller. Monitor for now  # Left renal mass- suspicious for malignancy.  Stable on most recent PET scan.  # Chronic elevation for alk phos as per patient.  # follow up in 3 weeks/labs/keytruda.   cc; Dr.Lateef; Dr.Tullo; Dr. Allen Norris

## 2017-11-26 NOTE — Progress Notes (Signed)
[place] NOTE  Patient Care Team: Crecencio Mc, MD as PCP - General (Internal Medicine) Clent Jacks, RN as Registered Nurse Anthonette Legato, MD (Internal Medicine)  CHIEF COMPLAINTS/PURPOSE OF CONSULTATION: Esophageal cancer  #  Oncology History   # MARCH 2018- ADENO CA; mod diff [lower third of esophagus; EGD Bx; Dr.Wohl]; CT- A/P- no distant mets/LN. PET- TxN1; no EUS.  # April 5th 2018- Carbo-taxol with RT [until May 22nd 2018]; July 12h PET-improved.   # NOV 5th PET- Progression in mediastinum; EUS Bx- METASTATIC ADENO CA [Dr.Jowell; [facility]]  # NOV 30th 2018- Keytruda.    # Dysphagia- s/p EGD [Dr. Vicente Males; July 2018]; Repeat EGD [Dr.Wohl] s/p dilatation.  # March 2018- LEFT KIDNEY COMPLEX MASS ~2cm [incidental]; ~ 2 cm left lower lobe nodule [question second primary]  # Hx of Hyponatremia [[phone]; Dr.Lateef]  # MOLECULAR TESTING- Her 2 neu-NEGATIVE; PDL-1- POSITIVE [CPS-2]; MMR- STABLE.      Malignant neoplasm of lower third of esophagus (HCC)     HISTORY OF PRESENTING ILLNESS:  Brittney Jackson 83 y.o.  female diagnosed adenocarcinoma the Lower esophagus currently s/p  chemoradiation is here for follow-up [finished RT on may 21st] Is here for follow-up.    The interim patient had a Endoscopic ultrasound-of the mediastinal lymph node positive for metastatic adenocarcinoma.   Patient is here to proceed with immunotherapy.  Patient continues some mild difficulty swallowing.  Otherwise appetite is fair.  Overall swallowing is improved since stretching of the esophagus.   ROS: A complete 10 point review of system is done which is negative except mentioned above in history of present illness  MEDICAL HISTORY:  Past Medical History:  Diagnosis Date  . Arthritis    left knee  . Cancer ([place])    esophageal cancer  . Diverticulitis   . Dysphagia   . Function kidney decreased   . Hypertension   . Hypothyroidism   . Inappropriate ADH  secretion (HCC)   . Low sodium levels   . Osteoporosis   . Renal disorder     SURGICAL HISTORY: Past Surgical History:  Procedure Laterality Date  . CATARACT EXTRACTION W/PHACO Left 05/29/2015   Procedure: CATARACT EXTRACTION PHACO AND INTRAOCULAR LENS PLACEMENT (IOC);  Surgeon: Leandrew Koyanagi, MD;  Location: [place];  Service: Ophthalmology;  Laterality: Left;  . CATARACT EXTRACTION W/PHACO Right 01/15/2016   Procedure: CATARACT EXTRACTION PHACO AND INTRAOCULAR LENS PLACEMENT (IOC);  Surgeon: Leandrew Koyanagi, MD;  Location: [place];  Service: Ophthalmology;  Laterality: Right;  SHUGARCAINE  . ESOPHAGOGASTRODUODENOSCOPY (EGD) WITH PROPOFOL N/A 03/16/2017   Procedure: ESOPHAGOGASTRODUODENOSCOPY (EGD) WITH PROPOFOL;  Surgeon: [name], MD;  Location: [facility] ENDOSCOPY;  Service: Endoscopy;  Laterality: N/A;  . ESOPHAGOGASTRODUODENOSCOPY (EGD) WITH PROPOFOL N/A 06/25/2017   Procedure: ESOPHAGOGASTRODUODENOSCOPY (EGD) WITH PROPOFOL;  Surgeon: Jonathon Bellows, MD;  Location: [facility] ENDOSCOPY;  Service: Endoscopy;  Laterality: N/A;  . ESOPHAGOGASTRODUODENOSCOPY (EGD) WITH PROPOFOL N/A 07/14/2017   Procedure: ESOPHAGOGASTRODUODENOSCOPY (EGD) WITH PROPOFOL with dilation;  Surgeon: [name], MD;  Location: [facility] ENDOSCOPY;  Service: Endoscopy;  Laterality: N/A;  . ESOPHAGOGASTRODUODENOSCOPY (EGD) WITH PROPOFOL N/A 08/31/2017   Procedure: ESOPHAGOGASTRODUODENOSCOPY (EGD) WITH PROPOFOL;  Surgeon: [name], MD;  Location: [facility] ENDOSCOPY;  Service: Endoscopy;  Laterality: N/A;  . ESOPHAGOGASTRODUODENOSCOPY (EGD) WITH PROPOFOL N/A 09/21/2017   Procedure: ESOPHAGOGASTRODUODENOSCOPY (EGD) WITH PROPOFOL;  Surgeon: [name], MD;  Location: [facility] ENDOSCOPY;  Service: Endoscopy;  Laterality: N/A;  . ESOPHAGOGASTRODUODENOSCOPY (EGD) WITH PROPOFOL N/A 10/12/2017  Procedure: ESOPHAGOGASTRODUODENOSCOPY (EGD) WITH PROPOFOL;  Surgeon: [name], MD;  Location: [facility] ENDOSCOPY;  Service:  Endoscopy;  Laterality: N/A;  . REFRACTIVE SURGERY    . 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 Family is in the area.  Social History   Socioeconomic History  . Marital status: Widowed    Spouse name: Not on file  . Number of children: Not on file  . Years of education: Not on file  . Highest education level: Not on file  Social Needs  . Financial resource strain: Not on file  . Food insecurity - worry: Not on file  . Food insecurity - inability: Not on file  . Transportation needs - medical: Not on file  . Transportation needs - non-medical: Not on file  Occupational History  . 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 Smoking status: Former Smoker    Types: Cigarettes    Last attempt to quit: 07/21/2000    Years since quitting: 17.3  . Smokeless tobacco: Never Used  Substance and Sexual Activity  . Alcohol use: No  . Drug use: No  . Sexual activity: No  Other Topics Concern  . Not on file  Social History Narrative   Church activities, community service.   Lives alone.    FAMILY HISTORY: Family History  Problem Relation Age of Onset  . Heart attack Mother   . Cancer Maternal Uncle        renal cancer   . COPD Neg Hx        no breast, ovarian or colon    ALLERGIES:  is allergic to amlodipine.  MEDICATIONS:  Current Outpatient Medications  Medication Sig Dispense Refill  . levothyroxine (SYNTHROID, LEVOTHROID) 88 MCG tablet TAKE ONE (1) TABLET EACH DAY 90 tablet 1  . omeprazole (PRILOSEC OTC) 20 MG tablet Take 20 mg by mouth daily. Chew tablets    . potassium chloride SA (K-DUR,KLOR-CON) 20 MEQ tablet Take 1 tablet (20 mEq total) by mouth 2 (two) times daily. 30 tablet 3  . sucralfate (CARAFATE) 1 g tablet Take 1 tablet (1 g total) by mouth 4 (four) times daily -  with meals and at bedtime. Dissolve in warm water, swish and swallow 90 tablet 3  . labetalol  (NORMODYNE) 100 MG tablet Take 1 tablet (100 mg total) by mouth 3 (three) times daily. (Patient not taking: Reported on 11/05/2017) 60 tablet 0   No current facility-administered medications for this visit.       Marland Kitchen  PHYSICAL EXAMINATION: ECOG PERFORMANCE STATUS: 0 - Asymptomatic  Vitals:   11/26/17 1416  BP: (!) 160/81  Pulse: 88  Resp: 16  Temp: 97.7 F (36.5 C)   Filed Weights   11/26/17 1416  Weight: 128 lb 9.6 oz (58.3 kg)    GENERAL: Well-nourished well-developed; Alert, no distress and comfortable. She is accompanied by daughter. EYES: no pallor or icterus OROPHARYNX: no thrush or ulceration; good dentition  NECK: supple, no masses felt LYMPH:  no palpable lymphadenopathy in the cervical, axillary or inguinal regions LUNGS: clear to auscultation and  No wheeze or crackles HEART/CVS: regular rate & rhythm and no murmurs; no swelling bilateral lower extremities ABDOMEN: abdomen soft, non-tender and normal bowel sounds Musculoskeletal:no cyanosis of digits and no clubbing  PSYCH: alert & oriented x 3 with fluent speech NEURO: no focal motor/sensory deficits SKIN:  no rashes or significant lesions  LABORATORY  DATA:  I have reviewed the data as listed Lab Results  Component Value Date   WBC 5.4 11/26/2017   HGB 12.8 11/26/2017   HCT 38.4 11/26/2017   MCV 87.5 11/26/2017   PLT 251 11/26/2017   Recent Labs    07/03/17 1532  07/05/17 1655  08/06/17 1038 09/03/17 0936 11/26/17 1323  NA 131*   < >  --    < > 137 133* 137  K 3.1*   < >  --    < > 4.0 4.0 3.8  CL 93*   < >  --    < > 102 98* 103  CO2 29   < >  --    < > 27 26 25   GLUCOSE 120*   < >  --    < > 157* 108* 112*  BUN 16   < >  --    < > 33* 31* 27*  CREATININE 0.90   < >  --    < > 1.07* 1.08* 1.11*  CALCIUM 9.1   < >  --    < > 9.6 9.4 9.3  GFRNONAA 57*   < >  --    < > 46* 45* 44*  GFRAA >60   < >  --    < > 53* 53* 51*  PROT 7.3  --  7.1  --   --   --  7.2  ALBUMIN 3.5  --  3.3*  --   --   --   3.8  AST 37  --  25  --   --   --  23  ALT 24  --  20  --   --   --  16  ALKPHOS 108  --  110  --   --   --  134*  BILITOT 0.6  --  0.6  --   --   --  0.6  BILIDIR <0.1*  --  0.1  --   --   --   --   IBILI NOT CALCULATED  --  0.5  --   --   --   --    < > = values in this interval not displayed.    RADIOGRAPHIC STUDIES: I have personally reviewed the radiological images as listed and agreed with the findings in the report. Nm Pet Image Restag (ps) Skull Base To Thigh  Result Date: 11/03/2017 CLINICAL DATA:  Subsequent treatment strategy for lower esophageal cancer. EXAM: NUCLEAR MEDICINE PET SKULL BASE TO THIGH TECHNIQUE: 12.9 mCi F-18 FDG was injected intravenously. Full-ring PET imaging was performed from the skull base to thigh after the radiotracer. CT data was obtained and used for attenuation correction and anatomic localization. FASTING BLOOD GLUCOSE:  Value: 100 mg/dl COMPARISON:  Multiple exams, including PET-CT of 07/07/2017 FINDINGS: NECK No hypermetabolic lymph nodes in the neck. Symmetric glottic activity is felt to be physiologic. Atherosclerotic calcification of the carotid arteries noted. CHEST Further reduction in metabolic activity at the site of the prior distal esophageal tumor, previous maximum SUV 5.8, current 3.1 in this vicinity. However, just cephalad to this and below the carina there is a small focus of hypermetabolic activity which could represent a lymph node along the anterior margin of the distal esophagus but difficult to separate from the anterior esophageal wall, maximum SUV 5.3, not previously present. The curvilinear nodule in the left lower lobe is unchanged in size measuring 0.8 by 0.4 cm, no appreciable metabolic activity currently.  There is evidence of old granulomatous disease. Coronary, aortic arch, and branch vessel atherosclerotic vascular disease. Mild lower periesophageal stranding, as before. Mild right lower lobe scarring and mild left lower lobe  atelectasis. Mild mosaic attenuation in the lungs. ABDOMEN/PELVIS No abnormal hypermetabolic activity within the liver, pancreas, adrenal glands, or spleen. No hypermetabolic lymph nodes in the abdomen or pelvis. Known enhancing mass posteriorly in the left kidney with metabolic activity similar to the surrounding renal parenchyma enhance versus early occult on today's PET-CT, but based on prior postcontrast imaging the lesion has an appearance suggesting a renal cell carcinoma. Aortoiliac atherosclerotic vascular disease. Sigmoid diverticulosis. SKELETON No focal hypermetabolic activity to suggest skeletal metastasis. IMPRESSION: 1. Further reduction in metabolic activity of the primary tumor site, previous maximum SUV in the distal esophagus 5.8, current 3.1 which is near background mediastinal levels. 2. However, just cephalad to this and below the carina a small focus of hypermetabolic activity with maximum SUV of 5.3 is new, and could represent a local metastatic lymph node. Surveillance recommended. 3. Known enhancing mass posteriorly in the left kidney, probably a renal cell carcinoma, but extremely inconspicuous on PET-CT due to metabolic activity similar to the surrounding renal parenchyma. 4. Stable small nodule in the left lower lobe, no appreciable metabolic activity, average size 0.6 cm in diameter which is below sensitive PET-CT size thresholds. 5. Mosaic attenuation in the lungs is technically nonspecific and can be seen in a variety of conditions including asthma, bronchiolitis obliterans, hypersensitivity pneumonitis, and in the setting of chronic pulmonary embolism. PET-CT is not suited to assessing subtle parenchymal patterns in the lung because the CT images are obtained with the patient free breathing which introduces motion artifact. 6. Other imaging findings of potential clinical significance: Aortic Atherosclerosis (ICD10-I70.0). Coronary atherosclerosis. Sigmoid diverticulosis.  Electronically Signed   By: Van Clines M.D.   On: 11/03/2017 13:23    ASSESSMENT & PLAN:   Malignant neoplasm of lower third of esophagus [facility]) #Recurrent/metastatic adenocarcinoma of the esophagus-mediastinal lymph node status post biopsy-positive for metastatic adenocarcinoma.  #Proceed with Keytruda No. 1 today.  I again discussed 20% or so. Again; non-curative.    I discussed the mechanism of action; The goal of therapy is palliative; and length of treatments are likely ongoing/based upon the results of the scans.   Discussed the potential side effects of immunotherapy including but not limited to diarrhea; skin rash; elevated LFTs/endocrine abnormalities etc.  # Difficulty swallowing - second malignancy/radiation- status post dilatation x3- slightly  Improved. Again in October 2018.  # History of hypertension- elevated in 160ss; as per patient at home currently blood pressure is 130s to 140s.  Follow-up with PCP  # Left lower lobe centimeter lung nodule- question etiology. slightly smaller. Monitor for now  # Left renal mass- suspicious for malignancy.  Stable on most recent PET scan.  # Chronic elevation for alk phos as per patient.  # follow up in 3 weeks/labs/keytruda.   cc; Dr.Lateef; Dr.Tullo; Dr. Laurel Dimmer, MD 12/02/2017 4:48 PM

## 2017-12-13 ENCOUNTER — Other Ambulatory Visit: Payer: Self-pay | Admitting: Internal Medicine

## 2017-12-14 ENCOUNTER — Other Ambulatory Visit: Payer: Self-pay

## 2017-12-17 ENCOUNTER — Inpatient Hospital Stay (HOSPITAL_BASED_OUTPATIENT_CLINIC_OR_DEPARTMENT_OTHER): Payer: Medicare Other | Admitting: Internal Medicine

## 2017-12-17 ENCOUNTER — Other Ambulatory Visit: Payer: Self-pay

## 2017-12-17 ENCOUNTER — Inpatient Hospital Stay: Payer: Medicare Other

## 2017-12-17 ENCOUNTER — Inpatient Hospital Stay: Payer: Medicare Other | Attending: Internal Medicine

## 2017-12-17 VITALS — BP 174/93 | HR 77 | Temp 97.8°F | Resp 20 | Ht 60.0 in | Wt 127.9 lb

## 2017-12-17 DIAGNOSIS — E222 Syndrome of inappropriate secretion of antidiuretic hormone: Secondary | ICD-10-CM | POA: Diagnosis not present

## 2017-12-17 DIAGNOSIS — N2889 Other specified disorders of kidney and ureter: Secondary | ICD-10-CM | POA: Diagnosis not present

## 2017-12-17 DIAGNOSIS — M810 Age-related osteoporosis without current pathological fracture: Secondary | ICD-10-CM | POA: Insufficient documentation

## 2017-12-17 DIAGNOSIS — C155 Malignant neoplasm of lower third of esophagus: Secondary | ICD-10-CM

## 2017-12-17 DIAGNOSIS — E871 Hypo-osmolality and hyponatremia: Secondary | ICD-10-CM | POA: Insufficient documentation

## 2017-12-17 DIAGNOSIS — E039 Hypothyroidism, unspecified: Secondary | ICD-10-CM | POA: Diagnosis not present

## 2017-12-17 DIAGNOSIS — Z79899 Other long term (current) drug therapy: Secondary | ICD-10-CM | POA: Diagnosis not present

## 2017-12-17 DIAGNOSIS — Z87891 Personal history of nicotine dependence: Secondary | ICD-10-CM | POA: Diagnosis not present

## 2017-12-17 DIAGNOSIS — R1310 Dysphagia, unspecified: Secondary | ICD-10-CM | POA: Diagnosis not present

## 2017-12-17 DIAGNOSIS — I10 Essential (primary) hypertension: Secondary | ICD-10-CM

## 2017-12-17 DIAGNOSIS — Z5112 Encounter for antineoplastic immunotherapy: Secondary | ICD-10-CM | POA: Insufficient documentation

## 2017-12-17 DIAGNOSIS — M129 Arthropathy, unspecified: Secondary | ICD-10-CM | POA: Insufficient documentation

## 2017-12-17 DIAGNOSIS — R911 Solitary pulmonary nodule: Secondary | ICD-10-CM | POA: Diagnosis not present

## 2017-12-17 LAB — COMPREHENSIVE METABOLIC PANEL
ALT: 15 U/L (ref 14–54)
AST: 44 U/L — AB (ref 15–41)
Albumin: 3.7 g/dL (ref 3.5–5.0)
Alkaline Phosphatase: 145 U/L — ABNORMAL HIGH (ref 38–126)
Anion gap: 7 (ref 5–15)
BILIRUBIN TOTAL: 0.5 mg/dL (ref 0.3–1.2)
BUN: 29 mg/dL — AB (ref 6–20)
CHLORIDE: 101 mmol/L (ref 101–111)
CO2: 27 mmol/L (ref 22–32)
Calcium: 9.1 mg/dL (ref 8.9–10.3)
Creatinine, Ser: 1.11 mg/dL — ABNORMAL HIGH (ref 0.44–1.00)
GFR, EST AFRICAN AMERICAN: 51 mL/min — AB (ref >60)
GFR, EST NON AFRICAN AMERICAN: 44 mL/min — AB (ref >60)
Glucose, Bld: 101 mg/dL — ABNORMAL HIGH (ref 65–99)
POTASSIUM: 4.3 mmol/L (ref 3.5–5.1)
Sodium: 135 mmol/L (ref 135–145)
TOTAL PROTEIN: 7.2 g/dL (ref 6.5–8.1)

## 2017-12-17 LAB — CBC WITH DIFFERENTIAL/PLATELET
BASOS ABS: 0.1 10*3/uL (ref 0–0.1)
Basophils Relative: 2 %
EOS ABS: 0.1 10*3/uL (ref 0–0.7)
EOS PCT: 3 %
HCT: 37.8 % (ref 35.0–47.0)
HEMOGLOBIN: 12.6 g/dL (ref 12.0–16.0)
LYMPHS ABS: 1.3 10*3/uL (ref 1.0–3.6)
LYMPHS PCT: 27 %
MCH: 28.9 pg (ref 26.0–34.0)
MCHC: 33.4 g/dL (ref 32.0–36.0)
MCV: 86.4 fL (ref 80.0–100.0)
Monocytes Absolute: 0.8 10*3/uL (ref 0.2–0.9)
Monocytes Relative: 17 %
NEUTROS PCT: 51 %
Neutro Abs: 2.5 10*3/uL (ref 1.4–6.5)
Platelets: 274 10*3/uL (ref 150–440)
RBC: 4.38 MIL/uL (ref 3.80–5.20)
RDW: 14.3 % (ref 11.5–14.5)
WBC: 5 10*3/uL (ref 3.6–11.0)

## 2017-12-17 MED ORDER — SODIUM CHLORIDE 0.9 % IV SOLN
Freq: Once | INTRAVENOUS | Status: AC
  Administered 2017-12-17: 11:00:00 via INTRAVENOUS
  Filled 2017-12-17: qty 1000

## 2017-12-17 MED ORDER — SODIUM CHLORIDE 0.9 % IV SOLN
200.0000 mg | Freq: Once | INTRAVENOUS | Status: AC
  Administered 2017-12-17: 200 mg via INTRAVENOUS
  Filled 2017-12-17: qty 8

## 2017-12-17 NOTE — Progress Notes (Signed)
Patient here for esophageal cancer. She has no medical complaints.

## 2017-12-17 NOTE — Progress Notes (Signed)
[place] NOTE  Patient Care Team: Crecencio Mc, MD as PCP - General (Internal Medicine) Clent Jacks, RN as Registered Nurse Anthonette Legato, MD (Internal Medicine)  CHIEF COMPLAINTS/PURPOSE OF CONSULTATION: Esophageal cancer  #  Oncology History   # MARCH 2018- ADENO CA; mod diff [lower third of esophagus; EGD Bx; Dr.Wohl]; CT- A/P- no distant mets/LN. PET- TxN1; no EUS.  # April 5th 2018- Carbo-taxol with RT [until May 22nd 2018]; July 12h PET-improved.   # NOV 5th PET- Progression in mediastinum; EUS Bx- METASTATIC ADENO CA [Dr.Jowell; [facility]]  # NOV 30th 2018- Keytruda.    # Dysphagia- s/p EGD [Dr. Vicente Males; July 2018]; Repeat EGD [Dr.Wohl] s/p dilatation.  # March 2018- LEFT KIDNEY COMPLEX MASS ~2cm [incidental]; ~ 2 cm left lower lobe nodule [question second primary]  # Hx of Hyponatremia [[phone]; Dr.Lateef]  # MOLECULAR TESTING- Her 2 neu-NEGATIVE; PDL-1- POSITIVE [CPS-2]; MMR- STABLE.      Malignant neoplasm of lower third of esophagus (HCC)     HISTORY OF PRESENTING ILLNESS:  Brittney Jackson 83 y.o.  female metastatic/recurrent esophageal adenocarcinoma-on Beryle Flock is here for follow-up.  Patient complains of mild to moderate fatigue after cycle #1 of Keytruda 3 weeks ago.  Fatigue is currently resolved.  Patient denies any difficulty swallowing.  Otherwise appetite is fair.  No headaches.  No diarrhea no skin rash.  ROS: A complete 10 point review of system is done which is negative except mentioned above in history of present illness  MEDICAL HISTORY:  Past Medical History:  Diagnosis Date  . Arthritis    left knee  . Cancer ([place])    esophageal cancer  . Diverticulitis   . Dysphagia   . Function kidney decreased   . Hypertension   . Hypothyroidism   . Inappropriate ADH secretion (HCC)   . Low sodium levels   . Osteoporosis   . Renal disorder     SURGICAL HISTORY: Past Surgical History:  Procedure Laterality Date   . CATARACT EXTRACTION W/PHACO Left 05/29/2015   Procedure: CATARACT EXTRACTION PHACO AND INTRAOCULAR LENS PLACEMENT (IOC);  Surgeon: Leandrew Koyanagi, MD;  Location: [place];  Service: Ophthalmology;  Laterality: Left;  . CATARACT EXTRACTION W/PHACO Right 01/15/2016   Procedure: CATARACT EXTRACTION PHACO AND INTRAOCULAR LENS PLACEMENT (IOC);  Surgeon: Leandrew Koyanagi, MD;  Location: [facility];  Service: Ophthalmology;  Laterality: Right;  SHUGARCAINE  . ESOPHAGOGASTRODUODENOSCOPY (EGD) WITH PROPOFOL N/A 03/16/2017   Procedure: ESOPHAGOGASTRODUODENOSCOPY (EGD) WITH PROPOFOL;  Surgeon: [name], MD;  Location: [facility] ENDOSCOPY;  Service: Endoscopy;  Laterality: N/A;  . ESOPHAGOGASTRODUODENOSCOPY (EGD) WITH PROPOFOL N/A 06/25/2017   Procedure: ESOPHAGOGASTRODUODENOSCOPY (EGD) WITH PROPOFOL;  Surgeon: Jonathon Bellows, MD;  Location[facility] ENDOSCOPY;  Service: Endoscopy;  Laterality: N/A;  . ESOPHAGOGASTRODUODENOSCOPY (EGD) WITH PROPOFOL N/A 07/14/2017   Procedure: ESOPHAGOGASTRODUODENOSCOPY (EGD) WITH PROPOFOL with dilation;  Surgeon: [name], MD;  Location: [facility] ENDOSCOPY;  Service: Endoscopy;  Laterality: N/A;  . ESOPHAGOGASTRODUODENOSCOPY (EGD) WITH PROPOFOL N/A 08/31/2017   Procedure: ESOPHAGOGASTRODUODENOSCOPY (EGD) WITH PROPOFOL;  Surgeon: [name], MD;  Location: [facility] ENDOSCOPY;  Service: Endoscopy;  Laterality: N/A;  . ESOPHAGOGASTRODUODENOSCOPY (EGD) WITH PROPOFOL N/A 09/21/2017   Procedure: ESOPHAGOGASTRODUODENOSCOPY (EGD) WITH PROPOFOL;  Surgeon: [name], MD;  Location: [facility] ENDOSCOPY;  Service: Endoscopy;  Laterality: N/A;  . ESOPHAGOGASTRODUODENOSCOPY (EGD) WITH PROPOFOL N/A 10/12/2017   Procedure: ESOPHAGOGASTRODUODENOSCOPY (EGD) WITH PROPOFOL;  Surgeon: [name], MD;  Location: [facility] ENDOSCOPY;  Service: Endoscopy;  Laterality: N/A;  . REFRACTIVE  SURGERY    . 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  Social History   Socioeconomic History  . Marital status: Widowed    Spouse name: Not on file  . Number of children: Not on file  . Years of education: Not on file  . Highest education level: Not on file  Social Needs  . Financial resource strain: Not on file  . Food insecurity - worry: Not on file  . Food insecurity - inability: Not on file  . Transportation needs - medical: Not on file  . Transportation needs - non-medical: Not on file  Occupational History  . 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 Smoking status: Former Smoker    Types: Cigarettes    Last attempt to quit: 07/21/2000    Years since quitting: 17.4  . Smokeless tobacco: Never Used  Substance and Sexual Activity  . Alcohol use: No  . Drug use: No  . Sexual activity: No  Other Topics Concern  . Not on file  Social History Narrative   Church activities, community service.   Lives alone.    FAMILY HISTORY: Family History  Problem Relation Age of Onset  . Heart attack Mother   . Cancer Maternal Uncle        renal cancer   . COPD Neg Hx        no breast, ovarian or colon    ALLERGIES:  is allergic to amlodipine.  MEDICATIONS:  Current Outpatient Medications  Medication Sig Dispense Refill  . levothyroxine (SYNTHROID, LEVOTHROID) 88 MCG tablet TAKE 1 TABLET BY MOUTH ONCE A DAY 90 tablet 0  . sucralfate (CARAFATE) 1 g tablet Take 1 tablet (1 g total) by mouth 4 (four) times daily -  with meals and at bedtime. Dissolve in warm water, swish and swallow 90 tablet 3  . labetalol (NORMODYNE) 100 MG tablet Take 1 tablet (100 mg total) by mouth 3 (three) times daily. (Patient not taking: Reported on 11/05/2017) 60 tablet 0  . omeprazole (PRILOSEC OTC) 20 MG tablet Take 20 mg by mouth daily. Chew tablets    . potassium chloride SA (K-DUR,KLOR-CON) 20 MEQ tablet Take 1 tablet (20 mEq total) by mouth 2 (two) times daily. (Patient not  taking: Reported on 12/17/2017) 30 tablet 3   No current facility-administered medications for this visit.       Marland Kitchen  PHYSICAL EXAMINATION: ECOG PERFORMANCE STATUS: 0 - Asymptomatic  Vitals:   12/17/17 1033 12/17/17 1039  BP: (!) 202/93 (!) 174/93  Pulse: 89 77  Resp: 20   Temp: 97.8 F (36.6 C)    Filed Weights   12/17/17 1033  Weight: 127 lb 14.4 oz (58 kg)    GENERAL: Well-nourished well-developed; Alert, no distress and comfortable. She is accompanied by daughter. EYES: no pallor or icterus OROPHARYNX: no thrush or ulceration; good dentition  NECK: supple, no masses felt LYMPH:  no palpable lymphadenopathy in the cervical, axillary or inguinal regions LUNGS: clear to auscultation and  No wheeze or crackles HEART/CVS: regular rate & rhythm and no murmurs; no swelling bilateral lower extremities ABDOMEN: abdomen soft, non-tender and normal bowel sounds Musculoskeletal:no cyanosis of digits and no clubbing  PSYCH: alert & oriented x 3 with fluent speech NEURO: no focal motor/sensory deficits SKIN:  no rashes or significant lesions  LABORATORY DATA:  I have reviewed the data as  listed Lab Results  Component Value Date   WBC 5.0 12/17/2017   HGB 12.6 12/17/2017   HCT 37.8 12/17/2017   MCV 86.4 12/17/2017   PLT 274 12/17/2017   Recent Labs    07/03/17 1532  07/05/17 1655  09/03/17 0936 11/01/17 11/26/17 1323 12/17/17 1004  NA 131*   < >  --    < > 133* 138 137 135  K 3.1*   < >  --    < > 4.0  --  3.8 4.3  CL 93*   < >  --    < > 98*  --  103 101  CO2 29   < >  --    < > 26  --  25 27  GLUCOSE 120*   < >  --    < > 108*  --  112* 101*  BUN 16   < >  --    < > 31* 26* 27* 29*  CREATININE 0.90   < >  --    < > 1.08* 0.9 1.11* 1.11*  CALCIUM 9.1   < >  --    < > 9.4  --  9.3 9.1  GFRNONAA 57*   < >  --    < > 45*  --  44* 44*  GFRAA >60   < >  --    < > 53*  --  51* 51*  PROT 7.3  --  7.1  --   --   --  7.2 7.2  ALBUMIN 3.5  --  3.3*  --   --   --  3.8 3.7   AST 37  --  25  --   --   --  23 44*  ALT 24  --  20  --   --   --  16 15  ALKPHOS 108  --  110  --   --   --  134* 145*  BILITOT 0.6  --  0.6  --   --   --  0.6 0.5  BILIDIR <0.1*  --  0.1  --   --   --   --   --   IBILI NOT CALCULATED  --  0.5  --   --   --   --   --    < > = values in this interval not displayed.    RADIOGRAPHIC STUDIES: I have personally reviewed the radiological images as listed and agreed with the findings in the report. No results found.  ASSESSMENT & PLAN:   Malignant neoplasm of lower third of esophagus (HCC) #Recurrent/metastatic adenocarcinoma of the esophagus- on keytruda cycle #1.  #Proceed with Keytruda #2 today. Labs today reviewed;  acceptable for treatment today.  Will again get CT scan after 3-4 cycles.   # Difficulty swallowing - second malignancy/radiation- status post dilatation x3- slightly  Improved.   # History of hypertension- elevated in 160ss; as per patient at home currently blood pressure is 130s to 140s.  Follow-up with PCP  # Left lower lobe centimeter lung nodule- question etiology. slightly smaller. Monitor for now  # Left renal mass- suspicious for malignancy.  Stable on most recent PET scan.  # Chronic elevation for alk phos as per patient~145.   # follow up in 3 weeks/labs/keytruda.      Cammie Sickle, MD 12/17/2017 12:46 PM

## 2017-12-17 NOTE — Assessment & Plan Note (Addendum)
#  Recurrent/metastatic adenocarcinoma of the esophagus- on keytruda cycle #1.  #Proceed with Keytruda #2 today. Labs today reviewed;  acceptable for treatment today.  Will again get CT scan after 3-4 cycles.   # Difficulty swallowing - second malignancy/radiation- status post dilatation x3- slightly  Improved.   # History of hypertension- elevated in 160ss; as per patient at home currently blood pressure is 130s to 140s.  Follow-up with PCP  # Left lower lobe centimeter lung nodule- question etiology. slightly smaller. Monitor for now  # Left renal mass- suspicious for malignancy.  Stable on most recent PET scan.  # Chronic elevation for alk phos as per patient~145.   # follow up in 3 weeks/labs/keytruda.

## 2018-01-06 ENCOUNTER — Inpatient Hospital Stay (HOSPITAL_BASED_OUTPATIENT_CLINIC_OR_DEPARTMENT_OTHER): Payer: Medicare Other | Admitting: Internal Medicine

## 2018-01-06 ENCOUNTER — Other Ambulatory Visit: Payer: Self-pay

## 2018-01-06 ENCOUNTER — Inpatient Hospital Stay: Payer: Medicare Other | Attending: Internal Medicine

## 2018-01-06 ENCOUNTER — Inpatient Hospital Stay: Payer: Medicare Other

## 2018-01-06 ENCOUNTER — Encounter: Payer: Self-pay | Admitting: Internal Medicine

## 2018-01-06 VITALS — BP 165/83 | HR 101 | Temp 97.9°F | Resp 20 | Ht 60.0 in | Wt 128.0 lb

## 2018-01-06 VITALS — Temp 98.1°F

## 2018-01-06 DIAGNOSIS — I10 Essential (primary) hypertension: Secondary | ICD-10-CM

## 2018-01-06 DIAGNOSIS — R748 Abnormal levels of other serum enzymes: Secondary | ICD-10-CM | POA: Insufficient documentation

## 2018-01-06 DIAGNOSIS — R1310 Dysphagia, unspecified: Secondary | ICD-10-CM | POA: Insufficient documentation

## 2018-01-06 DIAGNOSIS — Z87891 Personal history of nicotine dependence: Secondary | ICD-10-CM

## 2018-01-06 DIAGNOSIS — C155 Malignant neoplasm of lower third of esophagus: Secondary | ICD-10-CM | POA: Insufficient documentation

## 2018-01-06 DIAGNOSIS — N289 Disorder of kidney and ureter, unspecified: Secondary | ICD-10-CM | POA: Diagnosis not present

## 2018-01-06 DIAGNOSIS — R911 Solitary pulmonary nodule: Secondary | ICD-10-CM | POA: Diagnosis not present

## 2018-01-06 DIAGNOSIS — Z5112 Encounter for antineoplastic immunotherapy: Secondary | ICD-10-CM | POA: Diagnosis not present

## 2018-01-06 LAB — CBC WITH DIFFERENTIAL/PLATELET
BASOS ABS: 0.1 10*3/uL (ref 0–0.1)
BASOS PCT: 1 %
Eosinophils Absolute: 0.1 10*3/uL (ref 0–0.7)
Eosinophils Relative: 2 %
HEMATOCRIT: 38.6 % (ref 35.0–47.0)
HEMOGLOBIN: 12.8 g/dL (ref 12.0–16.0)
Lymphocytes Relative: 19 %
Lymphs Abs: 1.2 10*3/uL (ref 1.0–3.6)
MCH: 28.9 pg (ref 26.0–34.0)
MCHC: 33.2 g/dL (ref 32.0–36.0)
MCV: 86.9 fL (ref 80.0–100.0)
MONO ABS: 0.9 10*3/uL (ref 0.2–0.9)
Monocytes Relative: 13 %
NEUTROS ABS: 4.4 10*3/uL (ref 1.4–6.5)
NEUTROS PCT: 65 %
Platelets: 263 10*3/uL (ref 150–440)
RBC: 4.44 MIL/uL (ref 3.80–5.20)
RDW: 14.4 % (ref 11.5–14.5)
WBC: 6.7 10*3/uL (ref 3.6–11.0)

## 2018-01-06 LAB — COMPREHENSIVE METABOLIC PANEL
ALK PHOS: 157 U/L — AB (ref 38–126)
ALT: 16 U/L (ref 14–54)
ANION GAP: 10 (ref 5–15)
AST: 25 U/L (ref 15–41)
Albumin: 3.9 g/dL (ref 3.5–5.0)
BILIRUBIN TOTAL: 0.5 mg/dL (ref 0.3–1.2)
BUN: 27 mg/dL — ABNORMAL HIGH (ref 6–20)
CALCIUM: 9.3 mg/dL (ref 8.9–10.3)
CO2: 25 mmol/L (ref 22–32)
Chloride: 101 mmol/L (ref 101–111)
Creatinine, Ser: 1.04 mg/dL — ABNORMAL HIGH (ref 0.44–1.00)
GFR calc non Af Amer: 48 mL/min — ABNORMAL LOW (ref >60)
GFR, EST AFRICAN AMERICAN: 55 mL/min — AB (ref >60)
Glucose, Bld: 85 mg/dL (ref 65–99)
Potassium: 3.6 mmol/L (ref 3.5–5.1)
Sodium: 136 mmol/L (ref 135–145)
TOTAL PROTEIN: 7.3 g/dL (ref 6.5–8.1)

## 2018-01-06 MED ORDER — SODIUM CHLORIDE 0.9 % IV SOLN
200.0000 mg | Freq: Once | INTRAVENOUS | Status: AC
  Administered 2018-01-06: 200 mg via INTRAVENOUS
  Filled 2018-01-06: qty 8

## 2018-01-06 MED ORDER — SODIUM CHLORIDE 0.9 % IV SOLN
Freq: Once | INTRAVENOUS | Status: AC
  Administered 2018-01-06: 11:00:00 via INTRAVENOUS
  Filled 2018-01-06: qty 1000

## 2018-01-06 NOTE — Assessment & Plan Note (Addendum)
#  Recurrent/metastatic adenocarcinoma of the esophagus- on keytruda.   #Proceed with Keytruda #3 today. Labs today reviewed;  acceptable for treatment today.  Will again get CT scan after 4 cycles.   # Difficulty swallowing - second malignancy/radiation- status post dilatation x3- ? slightly worsened.  Recommend patient follow-up with Dr. Verl Blalock regarding repeat endoscopy/dilatation.  #Chronic hypertension- elevated in 160ss; as per patient at home currently blood pressure is 130s to 140s.  # Left lower lobe centimeter lung nodule- question etiology. slightly smaller. Monitor for now  # Left renal mass- suspicious for malignancy.  Stable on most recent PET scan.  # Chronic elevation for alk phos as per patient~154  # follow up in 3 weeks/labs/keytruda.  Order imaging at that visit

## 2018-01-06 NOTE — Progress Notes (Signed)
[place] [facility] NOTE  Patient Care Team: Crecencio Mc, MD as PCP - General (Internal Medicine) Clent Jacks, RN as Registered Nurse Anthonette Legato, MD (Internal Medicine)  CHIEF COMPLAINTS/PURPOSE OF CONSULTATION: Esophageal cancer  #  Oncology History   # MARCH 2018- ADENO CA; mod diff [lower third of esophagus; EGD Bx; Dr.Wohl]; CT- A/P- no distant mets/LN. PET- TxN1; no EUS.  # April 5th 2018- Carbo-taxol with RT [until May 22nd 2018]; July 12h PET-improved.   # NOV 5th PET- Progression in mediastinum; EUS Bx- METASTATIC ADENO CA [Dr.Jowell; [facility]]  # NOV 30th 2018- Keytruda.    # Dysphagia- s/p EGD [Dr. Vicente Males; July 2018]; Repeat EGD [Dr.Wohl] s/p dilatation.  # March 2018- LEFT KIDNEY COMPLEX MASS ~2cm [incidental]; ~ 2 cm left lower lobe nodule [question second primary]  # Hx of Hyponatremia [~[phone]; Dr.Lateef]  # MOLECULAR TESTING- Her 2 neu-NEGATIVE; PDL-1- POSITIVE [CPS-2]; MMR- STABLE.      Malignant neoplasm of lower third of esophagus (HCC)     HISTORY OF PRESENTING ILLNESS:  Brittney Jackson 84 y.o.  female metastatic/recurrent esophageal adenocarcinoma-on Beryle Flock is here for follow-up.  As per family patient does have mild difficulty swallowing especially liquids.  Interested in a repeat endoscopy.  No nausea no vomiting no diarrhea.  Denies any weight loss.  Otherwise appetite is fair.  No headaches.  No skin rash.  ROS: A complete 10 point review of system is done which is negative except mentioned above in history of present illness  MEDICAL HISTORY:  Past Medical History:  Diagnosis Date  . Arthritis    left knee  . Cancer ([place])    esophageal cancer  . Diverticulitis   . Dysphagia   . Function kidney decreased   . Hypertension   . Hypothyroidism   . Inappropriate ADH secretion (HCC)   . Low sodium levels   . Osteoporosis   . Renal disorder     SURGICAL HISTORY: Past Surgical History:  Procedure Laterality Date   . CATARACT EXTRACTION W/PHACO Left 05/29/2015   Procedure: CATARACT EXTRACTION PHACO AND INTRAOCULAR LENS PLACEMENT (IOC);  Surgeon: Leandrew Koyanagi, MD;  Location: [place];  Service: Ophthalmology;  Laterality: Left;  . CATARACT EXTRACTION W/PHACO Right 01/15/2016   Procedure: CATARACT EXTRACTION PHACO AND INTRAOCULAR LENS PLACEMENT (IOC);  Surgeon: Leandrew Koyanagi, MD;  Location: [place];  Service: Ophthalmology;  Laterality: Right;  [place]  . ESOPHAGOGASTRODUODENOSCOPY (EGD) WITH PROPOFOL N/A 03/16/2017   Procedure: ESOPHAGOGASTRODUODENOSCOPY (EGD) WITH PROPOFOL;  Surgeon: [name], MD;  Location: [facility] ENDOSCOPY;  Service: Endoscopy;  Laterality: N/A;  . ESOPHAGOGASTRODUODENOSCOPY (EGD) WITH PROPOFOL N/A 06/25/2017   Procedure: ESOPHAGOGASTRODUODENOSCOPY (EGD) WITH PROPOFOL;  Surgeon: Jonathon Bellows, MD;  Location: [facility] ENDOSCOPY;  Service: Endoscopy;  Laterality: N/A;  . ESOPHAGOGASTRODUODENOSCOPY (EGD) WITH PROPOFOL N/A 07/14/2017   Procedure: ESOPHAGOGASTRODUODENOSCOPY (EGD) WITH PROPOFOL with dilation;  Surgeon: [name], MD;  Location: [facility] ENDOSCOPY;  Service: Endoscopy;  Laterality: N/A;  . ESOPHAGOGASTRODUODENOSCOPY (EGD) WITH PROPOFOL N/A 08/31/2017   Procedure: ESOPHAGOGASTRODUODENOSCOPY (EGD) WITH PROPOFOL;  Surgeon: [name], MD;  Location: [facility] ENDOSCOPY;  Service: Endoscopy;  Laterality: N/A;  . ESOPHAGOGASTRODUODENOSCOPY (EGD) WITH PROPOFOL N/A 09/21/2017   Procedure: ESOPHAGOGASTRODUODENOSCOPY (EGD) WITH PROPOFOL;  Surgeon: [name], MD;  Location: [facility] ENDOSCOPY;  Service: Endoscopy;  Laterality: N/A;  . ESOPHAGOGASTRODUODENOSCOPY (EGD) WITH PROPOFOL N/A 10/12/2017   Procedure: ESOPHAGOGASTRODUODENOSCOPY (EGD) WITH PROPOFOL;  Surgeon: [name], MD;  Location: [facility] ENDOSCOPY;  Service: Endoscopy;  Laterality: N/A;  .  REFRACTIVE SURGERY    . 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 Family is in the area.  Social History   Socioeconomic History  . Marital status: Widowed    Spouse name: Not on file  . Number of children: Not on file  . Years of education: Not on file  . Highest education level: Not on file  Social Needs  . Financial resource strain: Not on file  . Food insecurity - worry: Not on file  . Food insecurity - inability: Not on file  . Transportation needs - medical: Not on file  . Transportation needs - non-medical: Not on file  Occupational History  . 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 Smoking status: Former Smoker    Types: Cigarettes    Last attempt to quit: 07/21/2000    Years since quitting: 17.4  . Smokeless tobacco: Never Used  Substance and Sexual Activity  . Alcohol use: No  . Drug use: No  . Sexual activity: No  Other Topics Concern  . Not on file  Social History Narrative   Church activities, community service.   Lives alone.    FAMILY HISTORY: Family History  Problem Relation Age of Onset  . Heart attack Mother   . Cancer Maternal Uncle        renal cancer   . COPD Neg Hx        no breast, ovarian or colon    ALLERGIES:  is allergic to amlodipine.  MEDICATIONS:  Current Outpatient Medications  Medication Sig Dispense Refill  . levothyroxine (SYNTHROID, LEVOTHROID) 88 MCG tablet TAKE 1 TABLET BY MOUTH ONCE A DAY 90 tablet 0  . omeprazole (PRILOSEC OTC) 20 MG tablet Take 20 mg by mouth daily. Chew tablets    . sucralfate (CARAFATE) 1 g tablet Take 1 tablet (1 g total) by mouth 4 (four) times daily -  with meals and at bedtime. Dissolve in warm water, swish and swallow 90 tablet 3  . labetalol (NORMODYNE) 100 MG tablet Take 1 tablet (100 mg total) by mouth 3 (three) times daily. (Patient not taking: Reported on 11/05/2017) 60 tablet 0  . potassium chloride SA (K-DUR,KLOR-CON) 20 MEQ tablet Take 1 tablet (20 mEq total) by mouth 2 (two) times daily. (Patient not  taking: Reported on 12/17/2017) 30 tablet 3   No current facility-administered medications for this visit.       Marland Kitchen  PHYSICAL EXAMINATION: ECOG PERFORMANCE STATUS: 0 - Asymptomatic  Vitals:   01/06/18 1002 01/06/18 1006  BP: (!) 204/91 (!) 165/83  Pulse: (!) 104 (!) 101  Resp: 20   Temp: 97.9 F (36.6 C)    Filed Weights   01/06/18 1002  Weight: 128 lb (58.1 kg)    GENERAL: Well-nourished well-developed; Alert, no distress and comfortable. She is accompanied by daughter. EYES: no pallor or icterus OROPHARYNX: no thrush or ulceration; good dentition  NECK: supple, no masses felt LYMPH:  no palpable lymphadenopathy in the cervical, axillary or inguinal regions LUNGS: clear to auscultation and  No wheeze or crackles HEART/CVS: regular rate & rhythm and no murmurs; no swelling bilateral lower extremities ABDOMEN: abdomen soft, non-tender and normal bowel sounds Musculoskeletal:no cyanosis of digits and no clubbing  PSYCH: alert & oriented x 3 with fluent speech NEURO: no focal motor/sensory deficits SKIN:  no rashes or significant lesions  LABORATORY DATA:  I have reviewed the data  as listed Lab Results  Component Value Date   WBC 6.7 01/06/2018   HGB 12.8 01/06/2018   HCT 38.6 01/06/2018   MCV 86.9 01/06/2018   PLT 263 01/06/2018   Recent Labs    07/03/17 1532  07/05/17 1655  11/26/17 1323 12/17/17 1004 01/06/18 0925  NA 131*   < >  --    < > 137 135 136  K 3.1*   < >  --    < > 3.8 4.3 3.6  CL 93*   < >  --    < > 103 101 101  CO2 29   < >  --    < > 25 27 25   GLUCOSE 120*   < >  --    < > 112* 101* 85  BUN 16   < >  --    < > 27* 29* 27*  CREATININE 0.90   < >  --    < > 1.11* 1.11* 1.04*  CALCIUM 9.1   < >  --    < > 9.3 9.1 9.3  GFRNONAA 57*   < >  --    < > 44* 44* 48*  GFRAA >60   < >  --    < > 51* 51* 55*  PROT 7.3  --  7.1  --  7.2 7.2 7.3  ALBUMIN 3.5  --  3.3*  --  3.8 3.7 3.9  AST 37  --  25  --  23 44* 25  ALT 24  --  20  --  16 15 16    ALKPHOS 108  --  110  --  134* 145* 157*  BILITOT 0.6  --  0.6  --  0.6 0.5 0.5  BILIDIR <0.1*  --  0.1  --   --   --   --   IBILI NOT CALCULATED  --  0.5  --   --   --   --    < > = values in this interval not displayed.    RADIOGRAPHIC STUDIES: I have personally reviewed the radiological images as listed and agreed with the findings in the report. No results found.  ASSESSMENT & PLAN:   Malignant neoplasm of lower third of esophagus (HCC) #Recurrent/metastatic adenocarcinoma of the esophagus- on keytruda.   #Proceed with Keytruda #3 today. Labs today reviewed;  acceptable for treatment today.  Will again get CT scan after 4 cycles.   # Difficulty swallowing - second malignancy/radiation- status post dilatation x3- ? slightly worsened.  Recommend patient follow-up with Dr. Verl Blalock regarding repeat endoscopy/dilatation.  #Chronic hypertension- elevated in 160ss; as per patient at home currently blood pressure is 130s to 140s.  # Left lower lobe centimeter lung nodule- question etiology. slightly smaller. Monitor for now  # Left renal mass- suspicious for malignancy.  Stable on most recent PET scan.  # Chronic elevation for alk phos as per patient~154  # follow up in 3 weeks/labs/keytruda.  Order imaging at that visit     Cammie Sickle, MD 01/06/2018 10:34 AM

## 2018-01-06 NOTE — Progress Notes (Signed)
Reviewed VS with MD, proceed with treatment per Dr. Rogue Bussing.

## 2018-01-14 ENCOUNTER — Telehealth: Payer: Self-pay | Admitting: Gastroenterology

## 2018-01-14 NOTE — Telephone Encounter (Signed)
Patient's daughter Brittney Jackson called. [name] is having difficulty swallowing and needs an EGD to stretch her throat. Please call Brittney Jackson.

## 2018-01-14 NOTE — Telephone Encounter (Signed)
Spoke with pt's daughter, Jeani Hawking. Advised her I was going to see when I can add her mother on the schedule as Dr. Allen Norris is booked out until mid February at [facility].

## 2018-01-17 ENCOUNTER — Other Ambulatory Visit: Payer: Self-pay

## 2018-01-17 ENCOUNTER — Encounter: Payer: Self-pay | Admitting: *Deleted

## 2018-01-17 DIAGNOSIS — R1310 Dysphagia, unspecified: Secondary | ICD-10-CM

## 2018-01-17 DIAGNOSIS — R1319 Other dysphagia: Secondary | ICD-10-CM

## 2018-01-17 NOTE — Telephone Encounter (Signed)
Pt has been scheduled for an EGD with Wohl on 01/18/18.

## 2018-01-18 ENCOUNTER — Encounter
Admission: RE | Disposition: A | Discharge status: Home / Self Care | Payer: Self-pay | Source: Ambulatory Visit | Attending: Gastroenterology

## 2018-01-18 ENCOUNTER — Ambulatory Visit: Payer: Medicare Other | Admitting: Certified Registered Nurse Anesthetist

## 2018-01-18 ENCOUNTER — Ambulatory Visit
Admission: RE | Admit: 2018-01-18 | Discharge: 2018-01-18 | Disposition: A | Discharge status: Home / Self Care | Payer: Medicare Other | Source: Ambulatory Visit | Attending: Gastroenterology | Admitting: Gastroenterology

## 2018-01-18 ENCOUNTER — Encounter: Payer: Self-pay | Admitting: *Deleted

## 2018-01-18 DIAGNOSIS — Z87891 Personal history of nicotine dependence: Secondary | ICD-10-CM | POA: Diagnosis not present

## 2018-01-18 DIAGNOSIS — Z9889 Other specified postprocedural states: Secondary | ICD-10-CM | POA: Diagnosis not present

## 2018-01-18 DIAGNOSIS — Z79890 Hormone replacement therapy (postmenopausal): Secondary | ICD-10-CM | POA: Diagnosis not present

## 2018-01-18 DIAGNOSIS — R1310 Dysphagia, unspecified: Secondary | ICD-10-CM | POA: Diagnosis not present

## 2018-01-18 DIAGNOSIS — K449 Diaphragmatic hernia without obstruction or gangrene: Secondary | ICD-10-CM | POA: Insufficient documentation

## 2018-01-18 DIAGNOSIS — K2270 Barrett's esophagus without dysplasia: Secondary | ICD-10-CM | POA: Diagnosis not present

## 2018-01-18 DIAGNOSIS — I10 Essential (primary) hypertension: Secondary | ICD-10-CM | POA: Insufficient documentation

## 2018-01-18 DIAGNOSIS — N289 Disorder of kidney and ureter, unspecified: Secondary | ICD-10-CM | POA: Insufficient documentation

## 2018-01-18 DIAGNOSIS — M1990 Unspecified osteoarthritis, unspecified site: Secondary | ICD-10-CM | POA: Diagnosis not present

## 2018-01-18 DIAGNOSIS — C159 Malignant neoplasm of esophagus, unspecified: Secondary | ICD-10-CM | POA: Diagnosis not present

## 2018-01-18 DIAGNOSIS — M1712 Unilateral primary osteoarthritis, left knee: Secondary | ICD-10-CM | POA: Insufficient documentation

## 2018-01-18 DIAGNOSIS — K222 Esophageal obstruction: Secondary | ICD-10-CM | POA: Diagnosis not present

## 2018-01-18 DIAGNOSIS — M810 Age-related osteoporosis without current pathological fracture: Secondary | ICD-10-CM | POA: Diagnosis not present

## 2018-01-18 DIAGNOSIS — E039 Hypothyroidism, unspecified: Secondary | ICD-10-CM | POA: Insufficient documentation

## 2018-01-18 DIAGNOSIS — Z79899 Other long term (current) drug therapy: Secondary | ICD-10-CM | POA: Diagnosis not present

## 2018-01-18 DIAGNOSIS — Z9841 Cataract extraction status, right eye: Secondary | ICD-10-CM | POA: Diagnosis not present

## 2018-01-18 DIAGNOSIS — Z9842 Cataract extraction status, left eye: Secondary | ICD-10-CM | POA: Diagnosis not present

## 2018-01-18 DIAGNOSIS — Z961 Presence of intraocular lens: Secondary | ICD-10-CM | POA: Insufficient documentation

## 2018-01-18 DIAGNOSIS — R1319 Other dysphagia: Secondary | ICD-10-CM

## 2018-01-18 HISTORY — PX: ESOPHAGOGASTRODUODENOSCOPY (EGD) WITH PROPOFOL: SHX5813

## 2018-01-18 SURGERY — ESOPHAGOGASTRODUODENOSCOPY (EGD) WITH PROPOFOL
Anesthesia: General

## 2018-01-18 MED ORDER — ONDANSETRON HCL 4 MG/2ML IJ SOLN
INTRAMUSCULAR | Status: AC
  Administered 2018-01-18: 4 mg
  Filled 2018-01-18: qty 2

## 2018-01-18 MED ORDER — SODIUM CHLORIDE 0.9 % IV SOLN
INTRAVENOUS | Status: DC
  Administered 2018-01-18: 1000 mL via INTRAVENOUS

## 2018-01-18 MED ORDER — LIDOCAINE HCL (CARDIAC) 20 MG/ML IV SOLN
INTRAVENOUS | Status: DC | PRN
  Administered 2018-01-18: 50 mg via INTRAVENOUS

## 2018-01-18 MED ORDER — LIDOCAINE HCL (PF) 2 % IJ SOLN
INTRAMUSCULAR | Status: AC
  Filled 2018-01-18: qty 10

## 2018-01-18 MED ORDER — PROPOFOL 10 MG/ML IV BOLUS
INTRAVENOUS | Status: DC | PRN
  Administered 2018-01-18: 150 mg via INTRAVENOUS

## 2018-01-18 MED ORDER — PROPOFOL 10 MG/ML IV BOLUS
INTRAVENOUS | Status: AC
  Filled 2018-01-18: qty 40

## 2018-01-18 NOTE — Anesthesia Preprocedure Evaluation (Addendum)
Anesthesia Evaluation  Patient identified by MRN, date of birth, ID band Patient awake    Reviewed: Allergy & Precautions, H&P , NPO status , Patient's Chart, lab work & pertinent test results  History of Anesthesia Complications (+) PONV and history of anesthetic complications  Airway Mallampati: III  TM Distance: <3 FB Neck ROM: limited    Dental  (+) Poor Dentition, Chipped   Pulmonary neg shortness of breath, former smoker,           Cardiovascular Exercise Tolerance: Good hypertension, (-) angina(-) Past MI and (-) DOE      Neuro/Psych negative neurological ROS  negative psych ROS   GI/Hepatic negative GI ROS, Neg liver ROS,   Endo/Other  Hypothyroidism   Renal/GU Renal disease  negative genitourinary   Musculoskeletal  (+) Arthritis ,   Abdominal   Peds negative pediatric ROS (+)  Hematology negative hematology ROS (+)   Anesthesia Other Findings Past Medical History: No date: Arthritis     Comment:  left knee No date: Cancer (HCC)     Comment:  esophageal cancer No date: Diverticulitis No date: Dysphagia No date: Function kidney decreased No date: Hypertension No date: Hypothyroidism No date: Inappropriate ADH secretion (HCC) No date: Low sodium levels No date: Osteoporosis No date: Renal disorder  Past Surgical History: 05/29/2015: CATARACT EXTRACTION W/PHACO; Left     Comment:  Procedure: CATARACT EXTRACTION PHACO AND INTRAOCULAR               LENS PLACEMENT (IOC);  Surgeon: Leandrew Koyanagi, MD;               Location: [place];  Service: Ophthalmology;                Laterality: Left; 01/15/2016: CATARACT EXTRACTION W/PHACO; Right     Comment:  Procedure: CATARACT EXTRACTION PHACO AND INTRAOCULAR               LENS PLACEMENT (IOC);  Surgeon: Leandrew Koyanagi, MD;               Location: [place];  Service: Ophthalmology;                Laterality: Right;   [place] 03/16/2017: ESOPHAGOGASTRODUODENOSCOPY (EGD) WITH PROPOFOL; N/A     Comment:  Procedure: ESOPHAGOGASTRODUODENOSCOPY (EGD) WITH               PROPOFOL;  Surgeon: [name], MD;  Location: [facility]               ENDOSCOPY;  Service: Endoscopy;  Laterality: N/A; 06/25/2017: ESOPHAGOGASTRODUODENOSCOPY (EGD) WITH PROPOFOL; N/A     Comment:  Procedure: ESOPHAGOGASTRODUODENOSCOPY (EGD) WITH               PROPOFOL;  Surgeon: Jonathon Bellows, MD;  Location: [facility]               ENDOSCOPY;  Service: Endoscopy;  Laterality: N/A; 07/14/2017: ESOPHAGOGASTRODUODENOSCOPY (EGD) WITH PROPOFOL; N/A     Comment:  Procedure: ESOPHAGOGASTRODUODENOSCOPY (EGD) WITH               PROPOFOL with dilation;  Surgeon: [name], MD;                Location: [facility] ENDOSCOPY;  Service: Endoscopy;                Laterality: N/A; 08/31/2017: ESOPHAGOGASTRODUODENOSCOPY (EGD) WITH PROPOFOL; N/A     Comment:  Procedure: ESOPHAGOGASTRODUODENOSCOPY (EGD) WITH  PROPOFOL;  Surgeon: [name], MD;  Location: [facility]               ENDOSCOPY;  Service: Endoscopy;  Laterality: N/A; 09/21/2017: ESOPHAGOGASTRODUODENOSCOPY (EGD) WITH PROPOFOL; N/A     Comment:  Procedure: ESOPHAGOGASTRODUODENOSCOPY (EGD) WITH               PROPOFOL;  Surgeon: [name], MD;  Location: [facility]               ENDOSCOPY;  Service: Endoscopy;  Laterality: N/A; No date: TONSILLECTOMY  BMI    Body Mass Index:  24.22 kg/m      Reproductive/Obstetrics negative OB ROS                             Anesthesia Physical  Anesthesia Plan  ASA: III  Anesthesia Plan: General   Post-op Pain Management:    Induction: Intravenous  PONV Risk Score and Plan: Propofol infusion, Ondansetron and Dexamethasone  Airway Management Planned: Natural Airway and Nasal Cannula  Additional Equipment:   Intra-op Plan:   Post-operative Plan:   Informed Consent: I have reviewed the patients History and Physical, chart, labs  and discussed the procedure including the risks, benefits and alternatives for the proposed anesthesia with the patient or authorized representative who has indicated his/her understanding and acceptance.   Dental Advisory Given  Plan Discussed with: Anesthesiologist, CRNA and Surgeon  Anesthesia Plan Comments: (Patient consented for risks of anesthesia including but not limited to:  - adverse reactions to medications - risk of intubation if required - damage to teeth, lips or other oral mucosa - sore throat or hoarseness - Damage to heart, brain, lungs or loss of life  Patient voiced understanding.)        Anesthesia Quick Evaluation

## 2018-01-18 NOTE — Transfer of Care (Signed)
Immediate Anesthesia Transfer of Care Note  Patient: Brittney Jackson  Procedure(s) Performed: ESOPHAGOGASTRODUODENOSCOPY (EGD) WITH PROPOFOL (N/A )  Patient Location: PACU and Endoscopy Unit  Anesthesia Type:General  Level of Consciousness: drowsy  Airway & Oxygen Therapy: Patient Spontanous Breathing and Patient connected to nasal cannula oxygen  Post-op Assessment: Report given to RN and Post -op Vital signs reviewed and stable  Post vital signs: Reviewed and stable  Last Vitals:  Vitals:   01/18/18 0919  BP: (!) 181/91  Pulse: 96  Resp: 18  Temp: (!) 35.8 C  SpO2: 100%    Last Pain:  Vitals:   01/18/18 0919  TempSrc: Tympanic         Complications: No apparent anesthesia complications

## 2018-01-18 NOTE — Anesthesia Post-op Follow-up Note (Signed)
Anesthesia QCDR form completed.        

## 2018-01-18 NOTE — H&P (Signed)
Brittney Lame, MD [address] [place], [place] [address] [phone] Fax : [phone]  Primary Care Physician:  Brittney Mc, MD Primary Gastroenterologist:  Dr. Allen Norris  Pre-Procedure History & Physical: HPI:  Brittney Jackson is a 84 y.o. female is here for an endoscopy.   Past Medical History:  Diagnosis Date  . Arthritis    left knee  . Cancer ([place])    esophageal cancer  . Diverticulitis   . Dysphagia   . Function kidney decreased   . Hypertension   . Hypothyroidism   . Inappropriate ADH secretion (HCC)   . Low sodium levels   . Osteoporosis   . Renal disorder     Past Surgical History:  Procedure Laterality Date  . CATARACT EXTRACTION W/PHACO Left 05/29/2015   Procedure: CATARACT EXTRACTION PHACO AND INTRAOCULAR LENS PLACEMENT (IOC);  Surgeon: Leandrew Koyanagi, MD;  Location: [place];  Service: Ophthalmology;  Laterality: Left;  . CATARACT EXTRACTION W/PHACO Right 01/15/2016   Procedure: CATARACT EXTRACTION PHACO AND INTRAOCULAR LENS PLACEMENT (IOC);  Surgeon: Leandrew Koyanagi, MD;  Location: [facility];  Service: Ophthalmology;  Laterality: Right;  [place]  . ESOPHAGOGASTRODUODENOSCOPY (EGD) WITH PROPOFOL N/A 03/16/2017   Procedure: ESOPHAGOGASTRODUODENOSCOPY (EGD) WITH PROPOFOL;  Surgeon: Brittney Lame, MD;  Location: [facility] ENDOSCOPY;  Service: Endoscopy;  Laterality: N/A;  . ESOPHAGOGASTRODUODENOSCOPY (EGD) WITH PROPOFOL N/A 06/25/2017   Procedure: ESOPHAGOGASTRODUODENOSCOPY (EGD) WITH PROPOFOL;  Surgeon: Jonathon Bellows, MD;  Location[facility] ENDOSCOPY;  Service: Endoscopy;  Laterality: N/A;  . ESOPHAGOGASTRODUODENOSCOPY (EGD) WITH PROPOFOL N/A 07/14/2017   Procedure: ESOPHAGOGASTRODUODENOSCOPY (EGD) WITH PROPOFOL with dilation;  Surgeon: Brittney Lame, MD;  Location: [facility] ENDOSCOPY;  Service: Endoscopy;  Laterality: N/A;  . ESOPHAGOGASTRODUODENOSCOPY (EGD) WITH PROPOFOL N/A 08/31/2017   Procedure: ESOPHAGOGASTRODUODENOSCOPY  (EGD) WITH PROPOFOL;  Surgeon: Brittney Lame, MD;  Location: [facility] ENDOSCOPY;  Service: Endoscopy;  Laterality: N/A;  . ESOPHAGOGASTRODUODENOSCOPY (EGD) WITH PROPOFOL N/A 09/21/2017   Procedure: ESOPHAGOGASTRODUODENOSCOPY (EGD) WITH PROPOFOL;  Surgeon: Brittney Lame, MD;  Location: [facility] ENDOSCOPY;  Service: Endoscopy;  Laterality: N/A;  . ESOPHAGOGASTRODUODENOSCOPY (EGD) WITH PROPOFOL N/A 10/12/2017   Procedure: ESOPHAGOGASTRODUODENOSCOPY (EGD) WITH PROPOFOL;  Surgeon: Brittney Lame, MD;  Location: [facility] ENDOSCOPY;  Service: Endoscopy;  Laterality: N/A;  . EYE SURGERY    . REFRACTIVE SURGERY    . TONSILLECTOMY      Prior to Admission medications   Medication Sig Start Date End Date Taking? Authorizing Provider  levothyroxine (SYNTHROID, LEVOTHROID) 88 MCG tablet TAKE 1 TABLET BY MOUTH ONCE A DAY 12/13/17   Brittney Mc, MD  omeprazole (PRILOSEC OTC) 20 MG tablet Take 20 mg by mouth daily. Chew tablets    [provider]  potassium chloride SA (K-DUR,KLOR-CON) 20 MEQ tablet Take 1 tablet (20 mEq total) by mouth 2 (two) times daily. Patient not taking: Reported on 12/17/2017 07/09/17   Cammie Sickle, MD  sucralfate (CARAFATE) 1 g tablet Take 1 tablet (1 g total) by mouth 4 (four) times daily -  with meals and at bedtime. Dissolve in warm water, swish and swallow 04/06/17   Noreene Filbert, MD    Allergies as of 01/17/2018 - Review Complete 01/17/2018  Allergen Reaction Noted  . Amlodipine Swelling 01/09/2014    Family History  Problem Relation Age of Onset  . Heart attack Mother   . Cancer Maternal Uncle        renal cancer   . COPD Neg Hx        no breast, ovarian or  colon    Social History   Socioeconomic History  . Marital status: Widowed    Spouse name: Not on file  . Number of children: Not on file  . Years of education: Not on file  . Highest education level: Not on file  Social Needs  . Financial resource strain: Not on file  . Food insecurity - worry: Not  on file  . Food insecurity - inability: Not on file  . Transportation needs - medical: Not on file  . Transportation needs - non-medical: Not on file  Occupational History  . 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 Smoking status: Former Smoker    Types: Cigarettes    Last attempt to quit: 07/21/2000    Years since quitting: 17.5  . Smokeless tobacco: Never Used  Substance and Sexual Activity  . Alcohol use: No  . Drug use: No  . Sexual activity: No  Other Topics Concern  . Not on file  Social History Narrative   Church activities, community service.   Lives alone.    Review of Systems: See HPI, otherwise negative ROS  Physical Exam: BP (!) 181/91   Pulse 96   Temp (!) 96.4 F (35.8 C) (Tympanic)   Resp 18   Ht 5' (1.524 m)   Wt 128 lb (58.1 kg)   SpO2 100%   BMI 25.00 kg/m  General:   Alert,  pleasant and cooperative in NAD Head:  Normocephalic and atraumatic. Neck:  Supple; no masses or thyromegaly. Lungs:  Clear throughout to auscultation.    Heart:  Regular rate and rhythm. Abdomen:  Soft, nontender and nondistended. Normal bowel sounds, without guarding, and without rebound.   Neurologic:  Alert and  oriented x4;  grossly normal neurologically.  Impression/Plan: [name] is here for an endoscopy to be performed for esophageal cancer with stricture.  Risks, benefits, limitations, and alternatives regarding  endoscopy have been reviewed with the patient.  Questions have been answered.  All parties agreeable.   Brittney Lame, MD  01/18/2018, 9:40 AM

## 2018-01-18 NOTE — Op Note (Signed)
[facility] Gastroenterology Patient Name: Brittney Jackson Procedure Date: 01/18/2018 9:40 AM MRN: [id number] Account #: [id number] Date of Birth: [birth year] Admit Type: Outpatient Age: 84 Room: [facility] ENDO ROOM 4 Gender: Female Note Status: Finalized Procedure:            Upper GI endoscopy Indications:          Dysphagia, Malignant esophageal tumor Providers:            [name] MD, MD Referring MD:         Deborra Medina, MD (Referring MD) Medicines:            Propofol per Anesthesia Complications:        No immediate complications. Procedure:            Pre-Anesthesia Assessment:                       - Prior to the procedure, a History and Physical was                        performed, and patient medications and allergies were                        reviewed. The patient's tolerance of previous                        anesthesia was also reviewed. The risks and benefits of                        the procedure and the sedation options and risks were                        discussed with the patient. All questions were                        answered, and informed consent was obtained. Prior                        Anticoagulants: The patient has taken no previous                        anticoagulant or antiplatelet agents. ASA Grade                        Assessment: II - A patient with mild systemic disease.                        After reviewing the risks and benefits, the patient was                        deemed in satisfactory condition to undergo the                        procedure.                       After obtaining informed consent, the endoscope was                        passed under direct vision. Throughout the procedure,  the patient's blood pressure, pulse, and oxygen                        saturations were monitored continuously. The Endoscope                        was introduced through the mouth, and advanced to the                        second part of duodenum. The upper GI endoscopy was                        accomplished without difficulty. The patient tolerated                        the procedure well. Findings:      One severe malignant-appearing, intrinsic stenosis was found at the       gastroesophageal junction. This measured 9 mm (inner diameter) x 2 cm       (in length) and was traversed after dilation. A TTS dilator was passed       through the scope. Dilation with a 12-13.5-15 mm balloon dilator was       performed to 15 mm. The dilation site was examined following endoscope       reinsertion and showed moderate improvement in luminal narrowing.      A medium-sized hiatal hernia was present.      Barrett's esophagus was present in the lower third of the esophagus.      The stomach was normal.      The examined duodenum was normal. Impression:           - Malignant-appearing esophageal stenosis. Dilated.                       - Medium-sized hiatal hernia.                       - Barrett's esophagus.                       - Normal stomach.                       - Normal examined duodenum.                       - No specimens collected. Recommendation:       - Discharge patient to home.                       - Resume previous diet.                       - Continue present medications.                       - Repeat upper endoscopy PRN for retreatment. Procedure Code(s):    --- Professional ---                       [id number], Esophagogastroduodenoscopy, flexible, transoral;                        with transendoscopic balloon dilation of esophagus                        (  less than 30 mm diameter) Diagnosis Code(s):    --- Professional ---                       K22.2, Esophageal obstruction                       C15.9, Malignant neoplasm of esophagus, unspecified                       R13.10, Dysphagia, unspecified CPT copyright 2016 American Medical Association. All rights reserved. The  codes documented in this report are preliminary and upon coder review may  be revised to meet current compliance requirements. [name] MD, MD 01/18/2018 10:00:05 AM This report has been signed electronically. Number of Addenda: 0 Note Initiated On: 01/18/2018 9:40 AM      [facility]

## 2018-01-18 NOTE — Anesthesia Postprocedure Evaluation (Signed)
Anesthesia Post Note  Patient: Brittney Jackson  Procedure(s) Performed: ESOPHAGOGASTRODUODENOSCOPY (EGD) WITH PROPOFOL (N/A )  Patient location during evaluation: PACU Anesthesia Type: General Level of consciousness: awake and alert and oriented Pain management: pain level controlled Vital Signs Assessment: post-procedure vital signs reviewed and stable Respiratory status: spontaneous breathing Cardiovascular status: blood pressure returned to baseline Anesthetic complications: no     Last Vitals:  Vitals:   01/18/18 1043 01/18/18 1048  BP: (!) 164/112 (!) 176/103  Pulse: 72 75  Resp: 18 18  Temp:    SpO2: 96% 96%    Last Pain:  Vitals:   01/18/18 1043  TempSrc:   PainSc: 0-No pain                 Sephira Zellman

## 2018-01-19 ENCOUNTER — Encounter: Payer: Self-pay | Admitting: Gastroenterology

## 2018-01-27 ENCOUNTER — Inpatient Hospital Stay: Payer: Medicare Other

## 2018-01-27 ENCOUNTER — Inpatient Hospital Stay (HOSPITAL_BASED_OUTPATIENT_CLINIC_OR_DEPARTMENT_OTHER): Payer: Medicare Other | Admitting: Internal Medicine

## 2018-01-27 VITALS — BP 193/83 | HR 92 | Temp 97.8°F | Wt 129.2 lb

## 2018-01-27 DIAGNOSIS — R1310 Dysphagia, unspecified: Secondary | ICD-10-CM

## 2018-01-27 DIAGNOSIS — N289 Disorder of kidney and ureter, unspecified: Secondary | ICD-10-CM

## 2018-01-27 DIAGNOSIS — I10 Essential (primary) hypertension: Secondary | ICD-10-CM

## 2018-01-27 DIAGNOSIS — C155 Malignant neoplasm of lower third of esophagus: Secondary | ICD-10-CM | POA: Diagnosis not present

## 2018-01-27 DIAGNOSIS — Z5112 Encounter for antineoplastic immunotherapy: Secondary | ICD-10-CM | POA: Diagnosis not present

## 2018-01-27 DIAGNOSIS — R911 Solitary pulmonary nodule: Secondary | ICD-10-CM

## 2018-01-27 DIAGNOSIS — R748 Abnormal levels of other serum enzymes: Secondary | ICD-10-CM | POA: Diagnosis not present

## 2018-01-27 DIAGNOSIS — Z87891 Personal history of nicotine dependence: Secondary | ICD-10-CM

## 2018-01-27 LAB — CBC WITH DIFFERENTIAL/PLATELET
BASOS ABS: 0.1 10*3/uL (ref 0–0.1)
Basophils Relative: 1 %
Eosinophils Absolute: 0.1 10*3/uL (ref 0–0.7)
Eosinophils Relative: 2 %
HCT: 39.4 % (ref 35.0–47.0)
HEMOGLOBIN: 13.1 g/dL (ref 12.0–16.0)
LYMPHS PCT: 22 %
Lymphs Abs: 1.4 10*3/uL (ref 1.0–3.6)
MCH: 28.7 pg (ref 26.0–34.0)
MCHC: 33.2 g/dL (ref 32.0–36.0)
MCV: 86.5 fL (ref 80.0–100.0)
MONO ABS: 0.7 10*3/uL (ref 0.2–0.9)
Monocytes Relative: 10 %
NEUTROS ABS: 4.1 10*3/uL (ref 1.4–6.5)
NEUTROS PCT: 65 %
Platelets: 297 10*3/uL (ref 150–440)
RBC: 4.55 MIL/uL (ref 3.80–5.20)
RDW: 13.8 % (ref 11.5–14.5)
WBC: 6.3 10*3/uL (ref 3.6–11.0)

## 2018-01-27 LAB — COMPREHENSIVE METABOLIC PANEL
ALBUMIN: 3.7 g/dL (ref 3.5–5.0)
ALT: 15 U/L (ref 14–54)
ANION GAP: 10 (ref 5–15)
AST: 27 U/L (ref 15–41)
Alkaline Phosphatase: 154 U/L — ABNORMAL HIGH (ref 38–126)
BUN: 27 mg/dL — ABNORMAL HIGH (ref 6–20)
CO2: 26 mmol/L (ref 22–32)
Calcium: 9.1 mg/dL (ref 8.9–10.3)
Chloride: 102 mmol/L (ref 101–111)
Creatinine, Ser: 0.96 mg/dL (ref 0.44–1.00)
GFR calc non Af Amer: 52 mL/min — ABNORMAL LOW (ref >60)
Glucose, Bld: 140 mg/dL — ABNORMAL HIGH (ref 65–99)
POTASSIUM: 3.9 mmol/L (ref 3.5–5.1)
Sodium: 138 mmol/L (ref 135–145)
Total Bilirubin: 0.4 mg/dL (ref 0.3–1.2)
Total Protein: 7.3 g/dL (ref 6.5–8.1)

## 2018-01-27 LAB — TSH: TSH: 0.206 u[IU]/mL — ABNORMAL LOW (ref 0.350–4.500)

## 2018-01-27 MED ORDER — SODIUM CHLORIDE 0.9 % IV SOLN
Freq: Once | INTRAVENOUS | Status: AC
  Administered 2018-01-27: 12:00:00 via INTRAVENOUS
  Filled 2018-01-27: qty 1000

## 2018-01-27 MED ORDER — SODIUM CHLORIDE 0.9 % IV SOLN
200.0000 mg | Freq: Once | INTRAVENOUS | Status: AC
  Administered 2018-01-27: 200 mg via INTRAVENOUS
  Filled 2018-01-27: qty 8

## 2018-01-27 NOTE — Progress Notes (Signed)
[place] NOTE  Patient Care Team: Crecencio Mc, MD as PCP - General (Internal Medicine) Clent Jacks, RN as Registered Nurse Anthonette Legato, MD (Internal Medicine)  CHIEF COMPLAINTS/PURPOSE OF CONSULTATION: Esophageal cancer  #  Oncology History   # MARCH 2018- ADENO CA; mod diff [lower third of esophagus; EGD Bx; Dr.Wohl]; CT- A/P- no distant mets/LN. PET- TxN1; no EUS.  # April 5th 2018- Carbo-taxol with RT [until May 22nd 2018]; July 12h PET-improved.   # NOV 5th PET- Progression in mediastinum; EUS Bx- METASTATIC ADENO CA [Dr.Jowell; [facility]]  # NOV 30th 2018- Keytruda.    # Dysphagia- s/p EGD [Dr. Vicente Males; July 2018]; Repeat EGD [Dr.Wohl] s/p dilatation.  # March 2018- LEFT KIDNEY COMPLEX MASS ~2cm [incidental]; ~ 2 cm left lower lobe nodule [question second primary]  # Hx of Hyponatremia [~[phone]; Dr.Lateef]  # MOLECULAR TESTING- Her 2 neu-NEGATIVE; PDL-1- POSITIVE [CPS-2]; MMR- STABLE.      Malignant neoplasm of lower third of esophagus (HCC)     HISTORY OF PRESENTING ILLNESS:  Brittney Jackson 84 y.o.  female metastatic/recurrent esophageal adenocarcinoma-on Beryle Flock is here for follow-up.  In the interim patient had a repeat endoscopy.  She had her esophagus stretched.  No nausea no vomiting no diarrhea.  Denies any weight loss.  Otherwise appetite is fair.  No headaches.  No skin rash.  ROS: A complete 10 point review of system is done which is negative except mentioned above in history of present illness  MEDICAL HISTORY:  Past Medical History:  Diagnosis Date  . Arthritis    left knee  . Cancer ([place])    esophageal cancer  . Diverticulitis   . Dysphagia   . Function kidney decreased   . Hypertension   . Hypothyroidism   . Inappropriate ADH secretion (HCC)   . Low sodium levels   . Osteoporosis   . Renal disorder     SURGICAL HISTORY: Past Surgical History:  Procedure Laterality Date  . CATARACT EXTRACTION W/PHACO  Left 05/29/2015   Procedure: CATARACT EXTRACTION PHACO AND INTRAOCULAR LENS PLACEMENT (IOC);  Surgeon: Leandrew Koyanagi, MD;  Location: [place];  Service: Ophthalmology;  Laterality: Left;  . CATARACT EXTRACTION W/PHACO Right 01/15/2016   Procedure: CATARACT EXTRACTION PHACO AND INTRAOCULAR LENS PLACEMENT (IOC);  Surgeon: Leandrew Koyanagi, MD;  Location: [place];  Service: Ophthalmology;  Laterality: Right;  SHUG[place]INE  . ESOPHAGOGASTRODUODENOSCOPY (EGD) WITH PROPOFOL N/A 03/16/2017   Procedure: ESOPHAGOGASTRODUODENOSCOPY (EGD) WITH PROPOFOL;  Surgeon: [name], MD;  Location: [facility] ENDOSCOPY;  Service: Endoscopy;  Laterality: N/A;  . ESOPHAGOGASTRODUODENOSCOPY (EGD) WITH PROPOFOL N/A 06/25/2017   Procedure: ESOPHAGOGASTRODUODENOSCOPY (EGD) WITH PROPOFOL;  Surgeon: Jonathon Bellows, MD;  Location: [facility] ENDOSCOPY;  Service: Endoscopy;  Laterality: N/A;  . ESOPHAGOGASTRODUODENOSCOPY (EGD) WITH PROPOFOL N/A 07/14/2017   Procedure: ESOPHAGOGASTRODUODENOSCOPY (EGD) WITH PROPOFOL with dilation;  Surgeon: [name], MD;  Location: [facility] ENDOSCOPY;  Service: Endoscopy;  Laterality: N/A;  . ESOPHAGOGASTRODUODENOSCOPY (EGD) WITH PROPOFOL N/A 08/31/2017   Procedure: ESOPHAGOGASTRODUODENOSCOPY (EGD) WITH PROPOFOL;  Surgeon: [name], MD;  Location: [facility] ENDOSCOPY;  Service: Endoscopy;  Laterality: N/A;  . ESOPHAGOGASTRODUODENOSCOPY (EGD) WITH PROPOFOL N/A 09/21/2017   Procedure: ESOPHAGOGASTRODUODENOSCOPY (EGD) WITH PROPOFOL;  Surgeon: [name], MD;  Location[facility] ENDOSCOPY;  Service: Endoscopy;  Laterality: N/A;  . ESOPHAGOGASTRODUODENOSCOPY (EGD) WITH PROPOFOL N/A 10/12/2017   Procedure: ESOPHAGOGASTRODUODENOSCOPY (EGD) WITH PROPOFOL;  Surgeon: [name], MD;  Location: [facility] ENDOSCOPY;  Service: Endoscopy;  Laterality: N/A;  . ESOPHAGOGASTRODUODENOSCOPY (EGD) WITH  PROPOFOL N/A 01/18/2018   Procedure: ESOPHAGOGASTRODUODENOSCOPY (EGD) WITH PROPOFOL;  Surgeon: [name], MD;   Location: [facility] ENDOSCOPY;  Service: Endoscopy;  Laterality: N/A;  . EYE SURGERY    . REFRACTIVE SURGERY    . 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 Family is in the area.  Social History   Socioeconomic History  . Marital status: Widowed    Spouse name: Not on file  . Number of children: Not on file  . Years of education: Not on file  . Highest education level: Not on file  Social Needs  . Financial resource strain: Not on file  . Food insecurity - worry: Not on file  . Food insecurity - inability: Not on file  . Transportation needs - medical: Not on file  . Transportation needs - non-medical: Not on file  Occupational History  . 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 Smoking status: Former Smoker    Types: Cigarettes    Last attempt to quit: 07/21/2000    Years since quitting: 17.5  . Smokeless tobacco: Never Used  Substance and Sexual Activity  . Alcohol use: No  . Drug use: No  . Sexual activity: No  Other Topics Concern  . Not on file  Social History Narrative   Church activities, community service.   Lives alone.    FAMILY HISTORY: Family History  Problem Relation Age of Onset  . Heart attack Mother   . Cancer Maternal Uncle        renal cancer   . COPD Neg Hx        no breast, ovarian or colon    ALLERGIES:  is allergic to amlodipine.  MEDICATIONS:  Current Outpatient Medications  Medication Sig Dispense Refill  . levothyroxine (SYNTHROID, LEVOTHROID) 88 MCG tablet TAKE 1 TABLET BY MOUTH ONCE A DAY 90 tablet 0  . omeprazole (PRILOSEC OTC) 20 MG tablet Take 20 mg by mouth daily. Chew tablets    . sucralfate (CARAFATE) 1 g tablet Take 1 tablet (1 g total) by mouth 4 (four) times daily -  with meals and at bedtime. Dissolve in warm water, swish and swallow 90 tablet 3  . potassium chloride SA (K-DUR,KLOR-CON) 20 MEQ tablet Take 1 tablet (20 mEq total) by  mouth 2 (two) times daily. (Patient not taking: Reported on 12/17/2017) 30 tablet 3   No current facility-administered medications for this visit.       Marland Kitchen  PHYSICAL EXAMINATION: ECOG PERFORMANCE STATUS: 0 - Asymptomatic  Vitals:   01/27/18 1108  BP: (!) 193/83  Pulse: 92  Temp: 97.8 F (36.6 C)  SpO2: 97%   Filed Weights   01/27/18 1108  Weight: 129 lb 3.2 oz (58.6 kg)    GENERAL: Well-nourished well-developed; Alert, no distress and comfortable. She is accompanied by daughter. EYES: no pallor or icterus OROPHARYNX: no thrush or ulceration; good dentition  NECK: supple, no masses felt LYMPH:  no palpable lymphadenopathy in the cervical, axillary or inguinal regions LUNGS: clear to auscultation and  No wheeze or crackles HEART/CVS: regular rate & rhythm and no murmurs; no swelling bilateral lower extremities ABDOMEN: abdomen soft, non-tender and normal bowel sounds Musculoskeletal:no cyanosis of digits and no clubbing  PSYCH: alert & oriented x 3 with fluent speech NEURO: no focal motor/sensory deficits SKIN:  no rashes or significant lesions  LABORATORY DATA:  I have reviewed the data as listed  Lab Results  Component Value Date   WBC 6.3 01/27/2018   HGB 13.1 01/27/2018   HCT 39.4 01/27/2018   MCV 86.5 01/27/2018   PLT 297 01/27/2018   Recent Labs    07/03/17 1532  07/05/17 1655  12/17/17 1004 01/06/18 0925 01/27/18 1052  NA 131*   < >  --    < > 135 136 138  K 3.1*   < >  --    < > 4.3 3.6 3.9  CL 93*   < >  --    < > 101 101 102  CO2 29   < >  --    < > 27 25 26   GLUCOSE 120*   < >  --    < > 101* 85 140*  BUN 16   < >  --    < > 29* 27* 27*  CREATININE 0.90   < >  --    < > 1.11* 1.04* 0.96  CALCIUM 9.1   < >  --    < > 9.1 9.3 9.1  GFRNONAA 57*   < >  --    < > 44* 48* 52*  GFRAA >60   < >  --    < > 51* 55* >60  PROT 7.3  --  7.1   < > 7.2 7.3 7.3  ALBUMIN 3.5  --  3.3*   < > 3.7 3.9 3.7  AST 37  --  25   < > 44* 25 27  ALT 24  --  20   < > 15  16 15   ALKPHOS 108  --  110   < > 145* 157* 154*  BILITOT 0.6  --  0.6   < > 0.5 0.5 0.4  BILIDIR <0.1*  --  0.1  --   --   --   --   IBILI NOT CALCULATED  --  0.5  --   --   --   --    < > = values in this interval not displayed.    RADIOGRAPHIC STUDIES: I have personally reviewed the radiological images as listed and agreed with the findings in the report. No results found.  ASSESSMENT & PLAN:   Malignant neoplasm of lower third of esophagus (HCC) #Recurrent/metastatic adenocarcinoma of the esophagus- on keytruda.   #Proceed with Keytruda #4 today. Labs today reviewed;  acceptable for treatment today.  Will again get PETscan after this cycle.   # Difficulty swallowing - second malignancy/radiation- status post dilatation x 4; improved [last jan 2019]  #Chronic hypertension- elevated in 160ss; as per patient at home currently blood pressure is 130s to 140s.  # Left lower lobe centimeter lung nodule- question etiology. slightly smaller. Monitor for now  # Left renal mass- suspicious for malignancy.  Stable on most recent PET scan.  # Chronic elevation for alk phos as per patient~154  # follow up in 3 weeks/labs/keytruda. PET prior/if not CT chest.      Cammie Sickle, MD 01/27/2018 8:15 PM

## 2018-01-27 NOTE — Assessment & Plan Note (Addendum)
#  Recurrent/metastatic adenocarcinoma of the esophagus- on keytruda.   #Proceed with Keytruda #4 today. Labs today reviewed;  acceptable for treatment today.  Will again get PETscan after this cycle.   # Difficulty swallowing - second malignancy/radiation- status post dilatation x 4; improved [last jan 2019]  #Chronic hypertension- elevated in 160ss; as per patient at home currently blood pressure is 130s to 140s.  # Left lower lobe centimeter lung nodule- question etiology. slightly smaller. Monitor for now  # Left renal mass- suspicious for malignancy.  Stable on most recent PET scan.  # Chronic elevation for alk phos as per patient~154  # follow up in 3 weeks/labs/keytruda. PET prior/if not CT chest.

## 2018-01-30 ENCOUNTER — Telehealth: Payer: Self-pay | Admitting: Internal Medicine

## 2018-01-30 MED ORDER — LEVOTHYROXINE SODIUM 50 MCG PO TABS: 50.0000 ug | ORAL_TABLET | Freq: Every day | ORAL | 3 refills | Status: AC

## 2018-01-30 NOTE — Telephone Encounter (Signed)
Please inform pt that TSH is low; and hence would recommend decreasing the dose of synthroid to 50 mcg [I sent  new script to her pharmacy.].

## 2018-01-31 NOTE — Telephone Encounter (Signed)
rn spoke with daughter Jeani Hawking. I discussed tsh results and adjustments in thyroid medication. Daughter will ensure pt starts on new medication dosing.

## 2018-02-10 ENCOUNTER — Telehealth: Payer: Self-pay

## 2018-02-10 NOTE — Telephone Encounter (Signed)
Received call from daughter, Jeani Hawking regarding PET authorization. Per Velna Hatchet she has medicare and is authorized. Jeani Hawking notified and was given instructions for PET scan on 2/18. Oncology Nurse Navigator Documentation  Navigator Location: [facility] (02/10/18 1100)   )Navigator Encounter Type: Telephone (02/10/18 1100) Telephone: Incoming Call (02/10/18 1100)                                                  Time Spent with Patient: 15 (02/10/18 1100)

## 2018-02-14 ENCOUNTER — Encounter
Admission: RE | Admit: 2018-02-14 | Discharge: 2018-02-14 | Disposition: A | Discharge status: Home / Self Care | Payer: Medicare Other | Source: Ambulatory Visit | Attending: Internal Medicine | Admitting: Internal Medicine

## 2018-02-14 DIAGNOSIS — C155 Malignant neoplasm of lower third of esophagus: Secondary | ICD-10-CM | POA: Diagnosis not present

## 2018-02-14 DIAGNOSIS — C159 Malignant neoplasm of esophagus, unspecified: Secondary | ICD-10-CM | POA: Diagnosis not present

## 2018-02-14 LAB — GLUCOSE, CAPILLARY: GLUCOSE-CAPILLARY: 105 mg/dL — AB (ref 65–99)

## 2018-02-14 MED ORDER — FLUDEOXYGLUCOSE F - 18 (FDG) INJECTION
12.0000 | Freq: Once | INTRAVENOUS | Status: AC | PRN
  Administered 2018-02-14: 12.745 via INTRAVENOUS

## 2018-02-17 ENCOUNTER — Inpatient Hospital Stay: Payer: Medicare Other | Attending: Internal Medicine

## 2018-02-17 ENCOUNTER — Inpatient Hospital Stay (HOSPITAL_BASED_OUTPATIENT_CLINIC_OR_DEPARTMENT_OTHER): Payer: Medicare Other | Admitting: Internal Medicine

## 2018-02-17 ENCOUNTER — Inpatient Hospital Stay: Payer: Medicare Other

## 2018-02-17 VITALS — BP 192/92 | HR 91 | Temp 97.9°F | Resp 20 | Ht 60.0 in | Wt 129.0 lb

## 2018-02-17 DIAGNOSIS — I10 Essential (primary) hypertension: Secondary | ICD-10-CM | POA: Diagnosis not present

## 2018-02-17 DIAGNOSIS — R911 Solitary pulmonary nodule: Secondary | ICD-10-CM | POA: Insufficient documentation

## 2018-02-17 DIAGNOSIS — E039 Hypothyroidism, unspecified: Secondary | ICD-10-CM | POA: Diagnosis not present

## 2018-02-17 DIAGNOSIS — C155 Malignant neoplasm of lower third of esophagus: Secondary | ICD-10-CM | POA: Diagnosis not present

## 2018-02-17 DIAGNOSIS — N289 Disorder of kidney and ureter, unspecified: Secondary | ICD-10-CM | POA: Diagnosis not present

## 2018-02-17 DIAGNOSIS — Z7189 Other specified counseling: Secondary | ICD-10-CM

## 2018-02-17 LAB — CBC WITH DIFFERENTIAL/PLATELET
BASOS ABS: 0.1 10*3/uL (ref 0–0.1)
Basophils Relative: 2 %
EOS PCT: 3 %
Eosinophils Absolute: 0.2 10*3/uL (ref 0–0.7)
HCT: 40.8 % (ref 35.0–47.0)
Hemoglobin: 13.6 g/dL (ref 12.0–16.0)
LYMPHS PCT: 27 %
Lymphs Abs: 1.5 10*3/uL (ref 1.0–3.6)
MCH: 28.9 pg (ref 26.0–34.0)
MCHC: 33.3 g/dL (ref 32.0–36.0)
MCV: 86.8 fL (ref 80.0–100.0)
MONO ABS: 0.7 10*3/uL (ref 0.2–0.9)
Monocytes Relative: 12 %
Neutro Abs: 3.3 10*3/uL (ref 1.4–6.5)
Neutrophils Relative %: 56 %
Platelets: 303 10*3/uL (ref 150–440)
RBC: 4.7 MIL/uL (ref 3.80–5.20)
RDW: 13.6 % (ref 11.5–14.5)
WBC: 5.8 10*3/uL (ref 3.6–11.0)

## 2018-02-17 LAB — COMPREHENSIVE METABOLIC PANEL
ALT: 14 U/L (ref 14–54)
AST: 25 U/L (ref 15–41)
Albumin: 3.7 g/dL (ref 3.5–5.0)
Alkaline Phosphatase: 163 U/L — ABNORMAL HIGH (ref 38–126)
Anion gap: 7 (ref 5–15)
BUN: 23 mg/dL — AB (ref 6–20)
CHLORIDE: 103 mmol/L (ref 101–111)
CO2: 28 mmol/L (ref 22–32)
Calcium: 9.3 mg/dL (ref 8.9–10.3)
Creatinine, Ser: 1.06 mg/dL — ABNORMAL HIGH (ref 0.44–1.00)
GFR, EST AFRICAN AMERICAN: 54 mL/min — AB (ref >60)
GFR, EST NON AFRICAN AMERICAN: 47 mL/min — AB (ref >60)
Glucose, Bld: 111 mg/dL — ABNORMAL HIGH (ref 65–99)
Potassium: 3.8 mmol/L (ref 3.5–5.1)
Sodium: 138 mmol/L (ref 135–145)
Total Bilirubin: 0.3 mg/dL (ref 0.3–1.2)
Total Protein: 7.5 g/dL (ref 6.5–8.1)

## 2018-02-17 NOTE — Progress Notes (Signed)
[place] NOTE  Patient Care Team: Crecencio Mc, MD as PCP - General (Internal Medicine) Clent Jacks, RN as Registered Nurse Anthonette Legato, MD (Internal Medicine)  CHIEF COMPLAINTS/PURPOSE OF CONSULTATION: Esophageal cancer  #  Oncology History   # MARCH 2018- ADENO CA; mod diff [lower third of esophagus; EGD Bx; Dr.Wohl]; CT- A/P- no distant mets/LN. PET- TxN1; no EUS.  # April 5th 2018- Carbo-taxol with RT [until May 22nd 2018]; July 12h PET-improved.   # NOV 5th PET- Progression in mediastinum; EUS Bx- METASTATIC ADENO CA [Dr.Jowell; [facility]]  # NOV 30th 2018- Keytruda; FEB 20th 2019- PET progressive subcarinal lymph node; and also left scapular/glenoid uptake  # march 1st week- 5-FU CIV q2 W   # Dysphagia- s/p EGD [Dr. Vicente Males; July 2018]; Repeat EGD [Dr.Wohl] s/p dilatation.  # March 2018- LEFT KIDNEY COMPLEX MASS ~2cm [incidental]; ~ 2 cm left lower lobe nodule [question second primary]  # Hx of Hyponatremia [~[phone]; Dr.Lateef]  # MOLECULAR TESTING- Her 2 neu-NEGATIVE; PDL-1- POSITIVE [CPS-2]; MMR- STABLE.      Malignant neoplasm of lower third of esophagus (HCC)     HISTORY OF PRESENTING ILLNESS:  Brittney Jackson 84 y.o.  female metastatic/recurrent esophageal adenocarcinoma-on Beryle Flock is here for follow-up/reviewed the results of her PET scan.  No nausea no vomiting no diarrhea.  Denies any weight loss.  Otherwise appetite is fair.  No headaches.  No skin rash.  Complains of question pain in the left arm only on questioning.  Denies any pain anywhere else.  ROS: A complete 10 point review of system is done which is negative except mentioned above in history of present illness  MEDICAL HISTORY:  Past Medical History:  Diagnosis Date  . Arthritis    left knee  . Cancer ([place])    esophageal cancer  . Diverticulitis   . Dysphagia   . Function kidney decreased   . Hypertension   . Hypothyroidism   . Inappropriate ADH secretion  (HCC)   . Low sodium levels   . Osteoporosis   . Renal disorder     SURGICAL HISTORY: Past Surgical History:  Procedure Laterality Date  . CATARACT EXTRACTION W/PHACO Left 05/29/2015   Procedure: CATARACT EXTRACTION PHACO AND INTRAOCULAR LENS PLACEMENT (IOC);  Surgeon: Leandrew Koyanagi, MD;  Location: [place];  Service: Ophthalmology;  Laterality: Left;  . CATARACT EXTRACTION W/PHACO Right 01/15/2016   Procedure: CATARACT EXTRACTION PHACO AND INTRAOCULAR LENS PLACEMENT (IOC);  Surgeon: Leandrew Koyanagi, MD;  Location: [place];  Service: Ophthalmology;  Laterality: Right;  SH[place]  . ESOPHAGOGASTRODUODENOSCOPY (EGD) WITH PROPOFOL N/A 03/16/2017   Procedure: ESOPHAGOGASTRODUODENOSCOPY (EGD) WITH PROPOFOL;  Surgeon: [name], MD;  Location: [facility] ENDOSCOPY;  Service: Endoscopy;  Laterality: N/A;  . ESOPHAGOGASTRODUODENOSCOPY (EGD) WITH PROPOFOL N/A 06/25/2017   Procedure: ESOPHAGOGASTRODUODENOSCOPY (EGD) WITH PROPOFOL;  Surgeon: Jonathon Bellows, MD;  Location: [facility] ENDOSCOPY;  Service: Endoscopy;  Laterality: N/A;  . ESOPHAGOGASTRODUODENOSCOPY (EGD) WITH PROPOFOL N/A 07/14/2017   Procedure: ESOPHAGOGASTRODUODENOSCOPY (EGD) WITH PROPOFOL with dilation;  Surgeon: [name], MD;  Location: [facility] ENDOSCOPY;  Service: Endoscopy;  Laterality: N/A;  . ESOPHAGOGASTRODUODENOSCOPY (EGD) WITH PROPOFOL N/A 08/31/2017   Procedure: ESOPHAGOGASTRODUODENOSCOPY (EGD) WITH PROPOFOL;  Surgeon: [name], MD;  Location: [facility] ENDOSCOPY;  Service: Endoscopy;  Laterality: N/A;  . ESOPHAGOGASTRODUODENOSCOPY (EGD) WITH PROPOFOL N/A 09/21/2017   Procedure: ESOPHAGOGASTRODUODENOSCOPY (EGD) WITH PROPOFOL;  Surgeon: [name], MD;  Location: [facility] ENDOSCOPY;  Service: Endoscopy;  Laterality: N/A;  . ESOPHAGOGASTRODUODENOSCOPY (EGD) WITH  PROPOFOL N/A 10/12/2017   Procedure: ESOPHAGOGASTRODUODENOSCOPY (EGD) WITH PROPOFOL;  Surgeon: [name], MD;  Location: [facility] ENDOSCOPY;  Service: Endoscopy;   Laterality: N/A;  . ESOPHAGOGASTRODUODENOSCOPY (EGD) WITH PROPOFOL N/A 01/18/2018   Procedure: ESOPHAGOGASTRODUODENOSCOPY (EGD) WITH PROPOFOL;  Surgeon: [name], MD;  Location: [facility] ENDOSCOPY;  Service: Endoscopy;  Laterality: N/A;  . EYE SURGERY    . REFRACTIVE SURGERY    . 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 Family is in the area.  Social History   Socioeconomic History  . Marital status: Widowed    Spouse name: Not on file  . Number of children: Not on file  . Years of education: Not on file  . Highest education level: Not on file  Social Needs  . Financial resource strain: Not on file  . Food insecurity - worry: Not on file  . Food insecurity - inability: Not on file  . Transportation needs - medical: Not on file  . Transportation needs - non-medical: Not on file  Occupational History  . 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 Smoking status: Former Smoker    Types: Cigarettes    Last attempt to quit: 07/21/2000    Years since quitting: 17.5  . Smokeless tobacco: Never Used  Substance and Sexual Activity  . Alcohol use: No  . Drug use: No  . Sexual activity: No  Other Topics Concern  . Not on file  Social History Narrative   Church activities, community service.   Lives alone.    FAMILY HISTORY: Family History  Problem Relation Age of Onset  . Heart attack Mother   . Cancer Maternal Uncle        renal cancer   . COPD Neg Hx        no breast, ovarian or colon    ALLERGIES:  is allergic to amlodipine.  MEDICATIONS:  Current Outpatient Medications  Medication Sig Dispense Refill  . levothyroxine (SYNTHROID) 50 MCG tablet Take 1 tablet (50 mcg total) by mouth daily before breakfast. 30 tablet 3  . omeprazole (PRILOSEC OTC) 20 MG tablet Take 20 mg by mouth daily. Chew tablets    . lidocaine-prilocaine (EMLA) cream Apply 1 application topically as  needed. Apply generously over the Mediport 45 minutes prior to chemotherapy. 30 g 0  . potassium chloride SA (K-DUR,KLOR-CON) 20 MEQ tablet Take 1 tablet (20 mEq total) by mouth 2 (two) times daily. (Patient not taking: Reported on 12/17/2017) 30 tablet 3  . sucralfate (CARAFATE) 1 g tablet Take 1 tablet (1 g total) by mouth 4 (four) times daily -  with meals and at bedtime. Dissolve in warm water, swish and swallow (Patient not taking: Reported on 02/17/2018) 90 tablet 3   No current facility-administered medications for this visit.       Marland Kitchen  PHYSICAL EXAMINATION: ECOG PERFORMANCE STATUS: 0 - Asymptomatic  Vitals:   02/17/18 1055 02/17/18 1058  BP: (!) 210/90 (!) 192/92  Pulse: 91   Resp: 20   Temp: 97.9 F (36.6 C)    Filed Weights   02/17/18 1055  Weight: 129 lb (58.5 kg)    GENERAL: Well-nourished well-developed; Alert, no distress and comfortable. She is accompanied by daughter. EYES: no pallor or icterus OROPHARYNX: no thrush or ulceration; good dentition  NECK: supple, no masses felt LYMPH:  no palpable lymphadenopathy in the cervical, axillary or inguinal regions LUNGS: clear to auscultation  and  No wheeze or crackles HEART/CVS: regular rate & rhythm and no murmurs; no swelling bilateral lower extremities ABDOMEN: abdomen soft, non-tender and normal bowel sounds Musculoskeletal:no cyanosis of digits and no clubbing  PSYCH: alert & oriented x 3 with fluent speech NEURO: no focal motor/sensory deficits SKIN:  no rashes or significant lesions  LABORATORY DATA:  I have reviewed the data as listed Lab Results  Component Value Date   WBC 5.8 02/17/2018   HGB 13.6 02/17/2018   HCT 40.8 02/17/2018   MCV 86.8 02/17/2018   PLT 303 02/17/2018   Recent Labs    07/03/17 1532  07/05/17 1655  01/06/18 0925 01/27/18 1052 02/17/18 1030  NA 131*   < >  --    < > 136 138 138  K 3.1*   < >  --    < > 3.6 3.9 3.8  CL 93*   < >  --    < > 101 102 103  CO2 29   < >  --    <  > 25 26 28   GLUCOSE 120*   < >  --    < > 85 140* 111*  BUN 16   < >  --    < > 27* 27* 23*  CREATININE 0.90   < >  --    < > 1.04* 0.96 1.06*  CALCIUM 9.1   < >  --    < > 9.3 9.1 9.3  GFRNONAA 57*   < >  --    < > 48* 52* 47*  GFRAA >60   < >  --    < > 55* >60 54*  PROT 7.3  --  7.1   < > 7.3 7.3 7.5  ALBUMIN 3.5  --  3.3*   < > 3.9 3.7 3.7  AST 37  --  25   < > 25 27 25   ALT 24  --  20   < > 16 15 14   ALKPHOS 108  --  110   < > 157* 154* 163*  BILITOT 0.6  --  0.6   < > 0.5 0.4 0.3  BILIDIR <0.1*  --  0.1  --   --   --   --   IBILI NOT CALCULATED  --  0.5  --   --   --   --    < > = values in this interval not displayed.    RADIOGRAPHIC STUDIES: I have personally reviewed the radiological images as listed and agreed with the findings in the report. Nm Pet Image Restag (ps) Skull Base To Thigh  Result Date: 02/14/2018 CLINICAL DATA:  Subsequent treatment strategy for esophageal neoplasm. EXAM: NUCLEAR MEDICINE PET SKULL BASE TO THIGH TECHNIQUE: 12.745 mCi F-18 FDG was injected intravenously. Full-ring PET imaging was performed from the skull base to thigh after the radiotracer. CT data was obtained and used for attenuation correction and anatomic localization. FASTING BLOOD GLUCOSE:  Value: 105 mg/dl COMPARISON:  PET-CT 11/03/2017. FINDINGS: NECK: No hypermetabolic lymph nodes in the neck. CHEST: Diffuse circumferential thickening of the distal esophagus is hypermetabolic (SUVmax = 7.2) , compatible with residual tumor. Borderline enlarged (13 mm short axis) subcarinal lymph node is markedly hypermetabolic (SUVmax = 8.7) . no other lymphadenopathy is noted in the thorax. Tiny 4 mm nodules in the right middle lobe (axial image 101 of series 3) and in the left lower lobe (image 102 of series 3), unchanged compared to the  prior study. No other larger more suspicious appearing pulmonary nodules or masses. Linear scarring in the lower lobes of the lungs bilaterally. No acute consolidative  airspace disease. No pleural effusions. There is aortic atherosclerosis, as well as atherosclerosis of the great vessels of the mediastinum and the coronary arteries, including calcified atherosclerotic plaque in the left main, left anterior descending, left circumflex and right coronary arteries. Mild calcifications of the aortic valve. ABDOMEN/PELVIS: No abnormal hypermetabolic activity within the liver, pancreas, adrenal glands, or spleen. No hypermetabolic lymph nodes in the abdomen or pelvis. Aortic atherosclerosis, without evidence of aneurysm throughout the abdomen and pelvis. Numerous colonic diverticulae are noted, without surrounding inflammatory changes to suggest an acute diverticulitis at this time. SKELETON: New area of sclerosis and hypermetabolism (SUVmax = 6.3) in the left glenoid, suspicious for a new metastatic lesion. IMPRESSION: 1. Increasing metabolic activity both in the primary lesion in the distal esophagus and in the borderline enlarged subcarinal lymph node when compared to prior study from 11/03/2017. 2. New area of sclerosis and hypermetabolism in the left glenoid, suspicious for a new left scapular metastasis. 3. Aortic atherosclerosis, in addition to left main and 3 vessel coronary artery disease. 4. There are calcifications of the aortic valve. Echocardiographic correlation for evaluation of potential valvular dysfunction may be warranted if clinically indicated. 5. Colonic diverticulosis without evidence of acute diverticulitis at this time. Aortic Atherosclerosis (ICD10-I70.0). Electronically Signed   By: Vinnie Langton M.D.   On: 02/14/2018 12:24    ASSESSMENT & PLAN:   Malignant neoplasm of lower third of esophagus [facility]) #Recurrent/metastatic adenocarcinoma of the esophagus- on s/p cycle #4 Beryle Flock; February 2019 PET scan progression-subcarinal lymph node progression; also left scapular/glenoid uptake.  # I had a long discussion the patient and family regarding the  concerns for the progressive disease/incurable nature of the disease.   # Discussed multiple options including radiation subcarinal area/left scapular [given the patient is largely asymptomatic].  Patient concerned about multiple adverse events with prior radiation.  We will make a referral to Dr. Donella Stade.  #Systemic therapy options-include 5-FU based chemotherapy; given her age patient's concerns-5-FU continuous infusion alone would be reasonable.  Also discussed regarding use of Taxol-weekly with RAM.  # Discussed the potential side effects including but not limited to-increasing fatigue, nausea vomiting, diarrhea, hair loss, sores in the mouth, increase risk of infection and also neuropathy.   Reviewed the rationale for using ram. Discussed the potential side effects including but not limited to elevated blood pressure ; nephrotic syndrome wound healing problems.  # Difficulty swallowing - second malignancy/radiation- status post dilatation x 4; improved [last jan 2019]  #Chronic hypertension- elevated in 160ss; as per patient at home currently blood pressure is 130s to 140s.  # Left lower lobe centimeter lung nodule- question etiology. Stable.  # Left renal mass- suspicious for malignancy.  Stable on most recent PET scan.  # Chronic elevation for alk phos as per patient~163  # Hypothyroidism- slightly low TSH-recently Synthroid adjusted to 50 mcg.  Will repeat again in few weeks.  # refer to Dr.Crystal; port placement port/ 5FU pump/MD/labs.  # will discuss for omniseq testing at next visit.   # 40 minutes face-to-face with the patient discussing the above plan of care; more than 50% of time spent on prognosis/ natural history; counseling and coordination.  # I reviewed the blood work- with the patient in detail; also reviewed the imaging independently [as summarized above]; and with the patient in detail.   Cc; Dr.Tullo/  Wohl/Dr.Lateef.       Cammie Sickle, MD 02/18/2018  7:35 AM

## 2018-02-17 NOTE — Assessment & Plan Note (Addendum)
#  Recurrent/metastatic adenocarcinoma of the esophagus- on s/p cycle #4 [place]; February 2019 PET scan progression-subcarinal lymph node progression; also left scapular/glenoid uptake.  # I had a long discussion the patient and family regarding the concerns for the progressive disease/incurable nature of the disease.   # Discussed multiple options including radiation subcarinal area/left scapular [given the patient is largely asymptomatic].  Patient concerned about multiple adverse events with prior radiation.  We will make a referral to Dr. Donella Stade.  #Systemic therapy options-include 5-FU based chemotherapy; given her age patient's concerns-5-FU continuous infusion alone would be reasonable.  Also discussed regarding use of Taxol-weekly with RAM.  # Discussed the potential side effects including but not limited to-increasing fatigue, nausea vomiting, diarrhea, hair loss, sores in the mouth, increase risk of infection and also neuropathy.   Reviewed the rationale for using ram. Discussed the potential side effects including but not limited to elevated blood pressure ; nephrotic syndrome wound healing problems.  # Difficulty swallowing - second malignancy/radiation- status post dilatation x 4; improved [last jan 2019]  #Chronic hypertension- elevated in 160ss; as per patient at home currently blood pressure is 130s to 140s.  # Left lower lobe centimeter lung nodule- question etiology. Stable.  # Left renal mass- suspicious for malignancy.  Stable on most recent PET scan.  # Chronic elevation for alk phos as per patient~163  # Hypothyroidism- slightly low TSH-recently Synthroid adjusted to 50 mcg.  Will repeat again in few weeks.  # refer to Dr.Crystal; port placement port/ 5FU pump/MD/labs.sent emla/anti-emetics.   # will discuss for omniseq testing at next visit.   # 40 minutes face-to-face with the patient discussing the above plan of care; more than 50% of time spent on prognosis/  natural history; counseling and coordination.  # I reviewed the blood work- with the patient in detail; also reviewed the imaging independently [as summarized above]; and with the patient in detail.   Cc; Dr.Tullo/ Wohl/Dr.Lateef.

## 2018-02-17 NOTE — Progress Notes (Signed)
Patient here for follow-up for esophageal cancer for pet scan results.

## 2018-02-18 DIAGNOSIS — Z7189 Other specified counseling: Secondary | ICD-10-CM | POA: Insufficient documentation

## 2018-02-18 MED ORDER — LIDOCAINE-PRILOCAINE 2.5-2.5 % EX CREA: 1.0000 "application " | TOPICAL_CREAM | CUTANEOUS | 0 refills | Status: AC | PRN

## 2018-02-18 MED ORDER — PROCHLORPERAZINE MALEATE 10 MG PO TABS: 10.0000 mg | ORAL_TABLET | Freq: Four times a day (QID) | ORAL | 1 refills | Status: AC | PRN

## 2018-02-18 MED ORDER — ONDANSETRON HCL 8 MG PO TABS: ORAL_TABLET | ORAL | 1 refills | Status: AC

## 2018-02-18 NOTE — Progress Notes (Signed)
DISCONTINUE ON PATHWAY REGIMEN - Gastroesophageal     Administer once every 2 weeks:     Ramucirumab   **Always confirm dose/schedule in your pharmacy ordering system**    REASON: Toxicities / Adverse Event PRIOR TREATMENT: GEOS13: Ramucirumab 8 mg/kg q2 Weeks Until Progression or Unacceptable Toxicity TREATMENT RESPONSE: Unable to Evaluate  START OFF PATHWAY REGIMEN - Gastroesophageal   [id number]:FOLFOX (q14d):   A cycle is every 14 days:     Oxaliplatin      Leucovorin      5-Fluorouracil      5-Fluorouracil   **Always confirm dose/schedule in your pharmacy ordering system**    Patient Characteristics: Distant Metastases (cM1/pM1) / Locally Recurrent Disease, Adenocarcinoma - Esophageal, GE Junction, and Gastric, Second Line, MSS / pMMR or MSI Unknown Histology: Adenocarcinoma Disease Classification: GE Junction Therapeutic Status: Local Recurrence (No Additional Staging) Would you be surprised if this patient died  in the next year<= I would be surprised if this patient died in the next year Line of Therapy: Second Line Microsatellite/Mismatch Repair Status: MSS/pMMR Intent of Therapy: Non-Curative / Palliative Intent, Discussed with Patient

## 2018-02-21 ENCOUNTER — Other Ambulatory Visit: Payer: Self-pay | Admitting: Radiology

## 2018-02-22 ENCOUNTER — Ambulatory Visit: Payer: Medicare Other

## 2018-02-22 ENCOUNTER — Ambulatory Visit
Admission: RE | Admit: 2018-02-22 | Discharge: 2018-02-22 | Disposition: A | Discharge status: Home / Self Care | Payer: Medicare Other | Source: Ambulatory Visit | Attending: Internal Medicine | Admitting: Internal Medicine

## 2018-02-22 DIAGNOSIS — E039 Hypothyroidism, unspecified: Secondary | ICD-10-CM | POA: Diagnosis not present

## 2018-02-22 DIAGNOSIS — M1712 Unilateral primary osteoarthritis, left knee: Secondary | ICD-10-CM | POA: Insufficient documentation

## 2018-02-22 DIAGNOSIS — I10 Essential (primary) hypertension: Secondary | ICD-10-CM | POA: Insufficient documentation

## 2018-02-22 DIAGNOSIS — C159 Malignant neoplasm of esophagus, unspecified: Secondary | ICD-10-CM | POA: Diagnosis not present

## 2018-02-22 DIAGNOSIS — Z87891 Personal history of nicotine dependence: Secondary | ICD-10-CM | POA: Insufficient documentation

## 2018-02-22 DIAGNOSIS — Z452 Encounter for adjustment and management of vascular access device: Secondary | ICD-10-CM | POA: Diagnosis not present

## 2018-02-22 DIAGNOSIS — C155 Malignant neoplasm of lower third of esophagus: Secondary | ICD-10-CM

## 2018-02-22 DIAGNOSIS — M810 Age-related osteoporosis without current pathological fracture: Secondary | ICD-10-CM | POA: Diagnosis not present

## 2018-02-22 HISTORY — PX: IR FLUORO GUIDE PORT INSERTION RIGHT: IMG5741

## 2018-02-22 LAB — BASIC METABOLIC PANEL
Anion gap: 11 (ref 5–15)
BUN: 22 mg/dL — AB (ref 6–20)
CHLORIDE: 103 mmol/L (ref 101–111)
CO2: 25 mmol/L (ref 22–32)
CREATININE: 0.96 mg/dL (ref 0.44–1.00)
Calcium: 9.5 mg/dL (ref 8.9–10.3)
GFR calc Af Amer: >60 mL/min (ref >60)
GFR, EST NON AFRICAN AMERICAN: 52 mL/min — AB (ref >60)
Glucose, Bld: 107 mg/dL — ABNORMAL HIGH (ref 65–99)
Potassium: 4.1 mmol/L (ref 3.5–5.1)
SODIUM: 139 mmol/L (ref 135–145)

## 2018-02-22 LAB — CBC
HCT: 39.7 % (ref 35.0–47.0)
Hemoglobin: 13.3 g/dL (ref 12.0–16.0)
MCH: 28.5 pg (ref 26.0–34.0)
MCHC: 33.5 g/dL (ref 32.0–36.0)
MCV: 85.1 fL (ref 80.0–100.0)
Platelets: 284 K/uL (ref 150–440)
RBC: 4.67 MIL/uL (ref 3.80–5.20)
RDW: 14.2 % (ref 11.5–14.5)
WBC: 6.6 K/uL (ref 3.6–11.0)

## 2018-02-22 LAB — PROTIME-INR
INR: 0.9
Prothrombin Time: 12.1 seconds (ref 11.4–15.2)

## 2018-02-22 MED ORDER — SODIUM CHLORIDE 0.9 % IV SOLN
INTRAVENOUS | Status: DC
  Administered 2018-02-22: 15:00:00 via INTRAVENOUS

## 2018-02-22 MED ORDER — MIDAZOLAM HCL 5 MG/5ML IJ SOLN
INTRAMUSCULAR | Status: AC | PRN
  Administered 2018-02-22: 1 mg via INTRAVENOUS

## 2018-02-22 MED ORDER — ONDANSETRON HCL 4 MG/2ML IJ SOLN
4.0000 mg | Freq: Once | INTRAMUSCULAR | Status: DC | PRN
  Filled 2018-02-22: qty 2

## 2018-02-22 MED ORDER — LIDOCAINE-EPINEPHRINE (PF) 1 %-1:200000 IJ SOLN
INTRAMUSCULAR | Status: AC
  Filled 2018-02-22: qty 30

## 2018-02-22 MED ORDER — FENTANYL CITRATE (PF) 100 MCG/2ML IJ SOLN
INTRAMUSCULAR | Status: AC | PRN
  Administered 2018-02-22: 25 ug via INTRAVENOUS

## 2018-02-22 MED ORDER — HYDRALAZINE HCL 20 MG/ML IJ SOLN
5.0000 mg | Freq: Once | INTRAMUSCULAR | Status: DC
  Filled 2018-02-22: qty 0.25

## 2018-02-22 MED ORDER — MIDAZOLAM HCL 5 MG/5ML IJ SOLN
INTRAMUSCULAR | Status: AC
  Filled 2018-02-22: qty 5

## 2018-02-22 MED ORDER — FENTANYL CITRATE (PF) 100 MCG/2ML IJ SOLN
INTRAMUSCULAR | Status: AC
  Filled 2018-02-22: qty 2

## 2018-02-22 MED ORDER — HEPARIN (PORCINE) IN NACL 2-0.9 UNIT/ML-% IJ SOLN
INTRAMUSCULAR | Status: AC
  Filled 2018-02-22: qty 500

## 2018-02-22 MED ORDER — HEPARIN SOD (PORK) LOCK FLUSH 100 UNIT/ML IV SOLN
INTRAVENOUS | Status: AC
  Filled 2018-02-22: qty 5

## 2018-02-22 MED ORDER — FENTANYL CITRATE (PF) 100 MCG/2ML IJ SOLN: 25.0000 ug | INTRAMUSCULAR | Status: DC | PRN

## 2018-02-22 MED ORDER — CEFAZOLIN SODIUM-DEXTROSE 2-4 GM/100ML-% IV SOLN
2.0000 g | INTRAVENOUS | Status: AC
  Administered 2018-02-22: 2 g via INTRAVENOUS

## 2018-02-22 NOTE — Discharge Instructions (Signed)
Moderate Conscious Sedation, Adult, Care After °These instructions provide you with information about caring for yourself after your procedure. Your health care provider may also give you more specific instructions. Your treatment has been planned according to current medical practices, but problems sometimes occur. Call your health care provider if you have any problems or questions after your procedure. °What can I expect after the procedure? °After your procedure, it is common: °· To feel sleepy for several hours. °· To feel clumsy and have poor balance for several hours. °· To have poor judgment for several hours. °· To vomit if you eat too soon. ° °Follow these instructions at home: °For at least 24 hours after the procedure: ° °· Do not: °? Participate in activities where you could fall or become injured. °? Drive. °? Use heavy machinery. °? Drink alcohol. °? Take sleeping pills or medicines that cause drowsiness. °? Make important decisions or sign legal documents. °? Take care of children on your own. °· Rest. °Eating and drinking °· Follow the diet recommended by your health care provider. °· If you vomit: °? Drink water, juice, or soup when you can drink without vomiting. °? Make sure you have little or no nausea before eating solid foods. °General instructions °· Have a responsible adult stay with you until you are awake and alert. °· Take over-the-counter and prescription medicines only as told by your health care provider. °· If you smoke, do not smoke without supervision. °· Keep all follow-up visits as told by your health care provider. This is important. °Contact a health care provider if: °· You keep feeling nauseous or you keep vomiting. °· You feel light-headed. °· You develop a rash. °· You have a fever. °Get help right away if: °· You have trouble breathing. °This information is not intended to replace advice given to you by your health care provider. Make sure you discuss any questions you have  with your health care provider. °Document Released: 10/04/2013 Document Revised: 05/18/2016 Document Reviewed: 04/04/2016 °Elsevier Interactive Patient Education © 2018 Else[employer] Inc[employer] °Implanted Port Insertion °Implanted port insertion is a procedure to put in a port and catheter. The port is a device with an injectable disk that can be accessed by your health care provider. The port is connected to a vein in the chest or neck by a small flexible tube (catheter). There are different types of ports. The implanted port may be used as a long-term IV access for: °· Medicines, such as chemotherapy. °· Fluids. °· Liquid nutrition, such as total parenteral nutrition (TPN). °· Blood samples. ° °Having a port means that your health care provider will not need to use the veins in your arms for these procedures. °Tell a health care provider about: °· Any allergies you have. °· All medicines you are taking, especially blood thinners, as well as any vitamins, herbs, eye drops, creams, over-the-counter medicines, and steroids. °· Any problems you or family members have had with anesthetic medicines. °· Any blood disorders you have. °· Any surgeries you have had. °· Any medical conditions you have, including diabetes or kidney problems. °· Whether you are pregnant or may be pregnant. °What are the risks? °Generally, this is a safe procedure. However, problems may occur, including: °· Allergic reactions to medicines or dyes. °· Damage to other structures or organs. °· Infection. °· Damage to the blood vessel, bruising, or bleeding at the puncture site. °· Blood clot. °· Breakdown of the skin over the port. °· A collection   of air in the chest that can cause one of the lungs to collapse (pneumothorax). This is rare. ° °What happens before the procedure? °Staying hydrated °Follow instructions from your health care provider about hydration, which may include: °· Up to 2 hours before the procedure - you may continue to drink clear  liquids, such as water, clear fruit juice, black coffee, and plain tea. ° °Eating and drinking restrictions °· Follow instructions from your health care provider about eating and drinking, which may include: °? 8 hours before the procedure - stop eating heavy meals or foods such as meat, fried foods, or fatty foods. °? 6 hours before the procedure - stop eating light meals or foods, such as toast or cereal. °? 6 hours before the procedure - stop drinking milk or drinks that contain milk. °? 2 hours before the procedure - stop drinking clear liquids. °Medicines °· Ask your health care provider about: °? Changing or stopping your regular medicines. This is especially important if you are taking diabetes medicines or blood thinners. °? Taking medicines such as aspirin and ibuprofen. These medicines can thin your blood. Do not take these medicines before your procedure if your health care provider instructs you not to. °· You may be given antibiotic medicine to help prevent infection. °General instructions °· Plan to have someone take you home from the hospital or clinic. °· If you will be going home right after the procedure, plan to have someone with you for 24 hours. °· You may have blood tests. °· You may be asked to shower with a germ-killing soap. °What happens during the procedure? °· To lower your risk of infection: °? Your health care team will wash or sanitize their hands. °? Your skin will be washed with soap. °? Hair may be removed from the surgical area. °· An IV tube will be inserted into one of your veins. °· You will be given one or more of the following: °? A medicine to help you relax (sedative). °? A medicine to numb the area (local anesthetic). °· Two small cuts (incisions) will be made to insert the port. °? One incision will be made in your neck to get access to the vein where the catheter will lie. °? The other incision will be made in the upper chest. This is where the port will lie. °· The  procedure may be done using continuous X-ray (fluoroscopy) or other imaging tools for guidance. °· The port and catheter will be placed. There may be a small, raised area where the port is. °· The port will be flushed with a salt solution (saline), and blood will be drawn to make sure that it is working correctly. °· The incisions will be closed. °· Bandages (dressings) may be placed over the incisions. °The procedure may vary among health care providers and hospitals. °What happens after the procedure? °· Your blood pressure, heart rate, breathing rate, and blood oxygen level will be monitored until the medicines you were given have worn off. °· Do not drive for 24 hours if you were given a sedative. °· You will be given a manufacturer's information card for the type of port that you have. Keep this with you. °· Your port will need to be flushed and checked as told by your health care provider, usually every few weeks. °· A chest X-ray will be done to: °? Check the placement of the port. °? Make sure there is no injury to your lung. °Summary °· Implanted   port insertion is a procedure to put in a port and catheter. °· The implanted port is used as a long-term IV access. °· The port will need to be flushed and checked as told by your health care provider, usually every few weeks. °· Keep your manufacturer's information card with you at all times. °This information is not intended to replace advice given to you by your health care provider. Make sure you discuss any questions you have with your health care provider. °Document Released: 10/04/2013 Document Revised: 11/04/2016 Document Reviewed: 11/04/2016 °Elsevier Interactive Patient Education © 2017 Elsevier Inc. ° °

## 2018-02-22 NOTE — H&P (Signed)
Chief Complaint: Patient was seen in consultation today for port placement at the request of Brahmanday,Govinda R  Referring Physician(s): Brahmanday,Govinda R  Patient Status: [facility] - Out-pt  History of Present Illness: Brittney Jackson is a 84 y.o. female with metastatic esophageal cancer and presents for a portacath placement.  Main complaint is difficulty swallowing secondary to her esophageal cancer. She has problems swallowing pills but she is still able to eat regularly. Previous esophageal dilatations. She denies fevers, chills, chest pain, difficulty breathing, GU problems.  Past Medical History:  Diagnosis Date  . Arthritis    left knee  . Cancer ([place])    esophageal cancer  . Diverticulitis   . Dysphagia   . Function kidney decreased   . Hypertension   . Hypothyroidism   . Inappropriate ADH secretion ([facility])   . Low sodium levels   . Osteoporosis   . Renal disorder     Past Surgical History:  Procedure Laterality Date  . CATARACT EXTRACTION W/PHACO Left 05/29/2015   Procedure: CATARACT EXTRACTION PHACO AND INTRAOCULAR LENS PLACEMENT (IOC);  Surgeon: Leandrew Koyanagi, MD;  Location: [place];  Service: Ophthalmology;  Laterality: Left;  . CATARACT EXTRACTION W/PHACO Right 01/15/2016   Procedure: CATARACT EXTRACTION PHACO AND INTRAOCULAR LENS PLACEMENT (IOC);  Surgeon: Leandrew Koyanagi, MD;  Location: [place];  Service: Ophthalmology;  Laterality: Right;  SHUGARCAINE  . ESOPHAGOGASTRODUODENOSCOPY (EGD) WITH PROPOFOL N/A 03/16/2017   Procedure: ESOPHAGOGASTRODUODENOSCOPY (EGD) WITH PROPOFOL;  Surgeon: [name], MD;  Location: [facility] ENDOSCOPY;  Service: Endoscopy;  Laterality: N/A;  . ESOPHAGOGASTRODUODENOSCOPY (EGD) WITH PROPOFOL N/A 06/25/2017   Procedure: ESOPHAGOGASTRODUODENOSCOPY (EGD) WITH PROPOFOL;  Surgeon: Jonathon Bellows, MD;  Location: [facility] ENDOSCOPY;  Service: Endoscopy;  Laterality: N/A;  . ESOPHAGOGASTRODUODENOSCOPY (EGD) WITH  PROPOFOL N/A 07/14/2017   Procedure: ESOPHAGOGASTRODUODENOSCOPY (EGD) WITH PROPOFOL with dilation;  Surgeon: [name], MD;  Location: [facility] ENDOSCOPY;  Service: Endoscopy;  Laterality: N/A;  . ESOPHAGOGASTRODUODENOSCOPY (EGD) WITH PROPOFOL N/A 08/31/2017   Procedure: ESOPHAGOGASTRODUODENOSCOPY (EGD) WITH PROPOFOL;  Surgeon: [name], MD;  Location: [facility] ENDOSCOPY;  Service: Endoscopy;  Laterality: N/A;  . ESOPHAGOGASTRODUODENOSCOPY (EGD) WITH PROPOFOL N/A 09/21/2017   Procedure: ESOPHAGOGASTRODUODENOSCOPY (EGD) WITH PROPOFOL;  Surgeon: [name], MD;  Location: [facility] ENDOSCOPY;  Service: Endoscopy;  Laterality: N/A;  . ESOPHAGOGASTRODUODENOSCOPY (EGD) WITH PROPOFOL N/A 10/12/2017   Procedure: ESOPHAGOGASTRODUODENOSCOPY (EGD) WITH PROPOFOL;  Surgeon: [name], MD;  Location: [facility] ENDOSCOPY;  Service: Endoscopy;  Laterality: N/A;  . ESOPHAGOGASTRODUODENOSCOPY (EGD) WITH PROPOFOL N/A 01/18/2018   Procedure: ESOPHAGOGASTRODUODENOSCOPY (EGD) WITH PROPOFOL;  Surgeon: [name], MD;  Location: [facility] ENDOSCOPY;  Service: Endoscopy;  Laterality: N/A;  . EYE SURGERY    . REFRACTIVE SURGERY    . TONSILLECTOMY      Allergies: Amlodipine  Medications: Prior to Admission medications   Medication Sig Start Date End Date Taking? Authorizing Provider  levothyroxine (SYNTHROID) 50 MCG tablet Take 1 tablet (50 mcg total) by mouth daily before breakfast. 01/30/18   Cammie Sickle, MD  lidocaine-prilocaine (EMLA) cream Apply 1 application topically as needed. Apply generously over the Mediport 45 minutes prior to chemotherapy. 02/18/18   Cammie Sickle, MD  omeprazole (PRILOSEC OTC) 20 MG tablet Take 20 mg by mouth daily. Chew tablets    [provider]  ondansetron (ZOFRAN) 8 MG tablet One pill every 8 hours as needed for nausea/vomitting. 02/18/18   Cammie Sickle, MD  potassium chloride SA (K-DUR,KLOR-CON) 20 MEQ tablet Take 1 tablet (20 mEq total) by mouth 2 (  two) times  daily. Patient not taking: Reported on 12/17/2017 07/09/17   Cammie Sickle, MD  prochlorperazine (COMPAZINE) 10 MG tablet Take 1 tablet (10 mg total) by mouth every 6 (six) hours as needed for nausea or vomiting. 02/18/18   Cammie Sickle, MD  sucralfate (CARAFATE) 1 g tablet Take 1 tablet (1 g total) by mouth 4 (four) times daily -  with meals and at bedtime. Dissolve in warm water, swish and swallow Patient not taking: Reported on 02/17/2018 04/06/17   Noreene Filbert, MD     Family History  Problem Relation Age of Onset  . Heart attack Mother   . Cancer Maternal Uncle        renal cancer   . COPD Neg Hx        no breast, ovarian or colon    Social History   Socioeconomic History  . Marital status: Widowed    Spouse name: Not on file  . Number of children: Not on file  . Years of education: Not on file  . Highest education level: Not on file  Social Needs  . Financial resource strain: Not on file  . Food insecurity - worry: Not on file  . Food insecurity - inability: Not on file  . Transportation needs - medical: Not on file  . Transportation needs - non-medical: Not on file  Occupational History  . 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 Smoking status: Former Smoker    Types: Cigarettes    Last attempt to quit: 07/21/2000    Years since quitting: 17.6  . Smokeless tobacco: Never Used  Substance and Sexual Activity  . Alcohol use: No  . Drug use: No  . Sexual activity: No  Other Topics Concern  . Not on file  Social History Narrative   Church activities, community service.   Lives alone.      Review of Systems: A 12 point ROS discussed and pertinent positives are indicated in the HPI above.  All other systems are negative.  Review of Systems  Constitutional: Negative for chills and fatigue.  Respiratory: Negative.   Cardiovascular: Negative.   Gastrointestinal:       Difficulty swallowing pills but still  able to eat regularly.    Genitourinary: Negative.     Vital Signs: There were no vitals taken for this visit.  Physical Exam  Constitutional: No distress.  HENT:  Mouth/Throat: Oropharynx is clear and moist.  Cardiovascular: Normal rate, regular rhythm and normal heart sounds.  Pulmonary/Chest: Effort normal and breath sounds normal. No stridor. No respiratory distress. She has no wheezes.  Abdominal: Soft. She exhibits no distension. There is no tenderness.    Imaging: Nm Pet Image Restag (ps) Skull Base To Thigh  Result Date: 02/14/2018 CLINICAL DATA:  Subsequent treatment strategy for esophageal neoplasm. EXAM: NUCLEAR MEDICINE PET SKULL BASE TO THIGH TECHNIQUE: 12.745 mCi F-18 FDG was injected intravenously. Full-ring PET imaging was performed from the skull base to thigh after the radiotracer. CT data was obtained and used for attenuation correction and anatomic localization. FASTING BLOOD GLUCOSE:  Value: 105 mg/dl COMPARISON:  PET-CT 11/03/2017. FINDINGS: NECK: No hypermetabolic lymph nodes in the neck. CHEST: Diffuse circumferential thickening of the distal esophagus is hypermetabolic (SUVmax = 7.2) , compatible with residual tumor. Borderline enlarged (13 mm short axis) subcarinal lymph node is markedly hypermetabolic (SUVmax = 8.7) . no other lymphadenopathy is noted in the thorax. Tiny 4 mm nodules in  the right middle lobe (axial image 101 of series 3) and in the left lower lobe (image 102 of series 3), unchanged compared to the prior study. No other larger more suspicious appearing pulmonary nodules or masses. Linear scarring in the lower lobes of the lungs bilaterally. No acute consolidative airspace disease. No pleural effusions. There is aortic atherosclerosis, as well as atherosclerosis of the great vessels of the mediastinum and the coronary arteries, including calcified atherosclerotic plaque in the left main, left anterior descending, left circumflex and right coronary  arteries. Mild calcifications of the aortic valve. ABDOMEN/PELVIS: No abnormal hypermetabolic activity within the liver, pancreas, adrenal glands, or spleen. No hypermetabolic lymph nodes in the abdomen or pelvis. Aortic atherosclerosis, without evidence of aneurysm throughout the abdomen and pelvis. Numerous colonic diverticulae are noted, without surrounding inflammatory changes to suggest an acute diverticulitis at this time. SKELETON: New area of sclerosis and hypermetabolism (SUVmax = 6.3) in the left glenoid, suspicious for a new metastatic lesion. IMPRESSION: 1. Increasing metabolic activity both in the primary lesion in the distal esophagus and in the borderline enlarged subcarinal lymph node when compared to prior study from 11/03/2017. 2. New area of sclerosis and hypermetabolism in the left glenoid, suspicious for a new left scapular metastasis. 3. Aortic atherosclerosis, in addition to left main and 3 vessel coronary artery disease. 4. There are calcifications of the aortic valve. Echocardiographic correlation for evaluation of potential valvular dysfunction may be warranted if clinically indicated. 5. Colonic diverticulosis without evidence of acute diverticulitis at this time. Aortic Atherosclerosis (ICD10-I70.0). Electronically Signed   By: Vinnie Langton M.D.   On: 02/14/2018 12:24    Labs:  CBC: Recent Labs    12/17/17 1004 01/06/18 0925 01/27/18 1052 02/17/18 1030  WBC 5.0 6.7 6.3 5.8  HGB 12.6 12.8 13.1 13.6  HCT 37.8 38.6 39.4 40.8  PLT 274 263 297 303    COAGS: Recent Labs    07/03/17 1532  INR 0.95    BMP: Recent Labs    12/17/17 1004 01/06/18 0925 01/27/18 1052 02/17/18 1030  NA 135 136 138 138  K 4.3 3.6 3.9 3.8  CL 101 101 102 103  CO2 27 25 26 28   GLUCOSE 101* 85 140* 111*  BUN 29* 27* 27* 23*  CALCIUM 9.1 9.3 9.1 9.3  CREATININE 1.11* 1.04* 0.96 1.06*  GFRNONAA 44* 48* 52* 47*  GFRAA 51* 55* >60 54*    LIVER FUNCTION TESTS: Recent Labs     12/17/17 1004 01/06/18 0925 01/27/18 1052 02/17/18 1030  BILITOT 0.5 0.5 0.4 0.3  AST 44* 25 27 25   ALT 15 16 15 14   ALKPHOS 145* 157* 154* 163*  PROT 7.2 7.3 7.3 7.5  ALBUMIN 3.7 3.9 3.7 3.7    TUMOR MARKERS: Recent Labs    04/01/17 0850  CEA 3.6    Assessment and Plan:  84 year old with metastatic esophageal cancer. Discussed portacath placement with patient and daughter. They have a good understanding of the procedure. We also discussed using moderate sedation. Patient has not had problems with sedation or anesthesia in the past. The risks of the procedure including bleeding and infection and catheter/vein occlusion were discussed with the patient. Informed consent was obtained. Plan for Port-A-Cath placement with moderate sedation.  Thank you for this interesting consult.  I greatly enjoyed meeting Brittney Jackson and look forward to participating in their care.  A copy of this report was sent to the requesting provider on this date.  Electronically Signed: Stephan Minister  Anselm Pancoast, MD 02/22/2018, 2:52 PM   I spent a total of  15 Minutes   in face to face in clinical consultation, greater than 50% of which was counseling/coordinating care for esophageal cancer.

## 2018-02-22 NOTE — Procedures (Signed)
Placement of right jugular port.  Tip at SVC/RA junction.  Minimal blood loss and no immediate complication.  

## 2018-02-22 NOTE — Progress Notes (Signed)
MD Dr. Anselm Pancoast notified of HTN, no orders at this time.

## 2018-02-22 NOTE — Procedures (Deleted)
Placement of right jugular port.  Tip at SVC/RA junction.  Minimal blood loss and no immediate complication.  

## 2018-02-23 ENCOUNTER — Other Ambulatory Visit: Payer: Self-pay

## 2018-02-23 ENCOUNTER — Telehealth: Payer: Self-pay | Admitting: Internal Medicine

## 2018-02-23 ENCOUNTER — Encounter: Payer: Self-pay | Admitting: Radiation Oncology

## 2018-02-23 ENCOUNTER — Other Ambulatory Visit: Payer: Self-pay | Admitting: *Deleted

## 2018-02-23 ENCOUNTER — Ambulatory Visit
Admission: RE | Admit: 2018-02-23 | Discharge: 2018-02-23 | Disposition: A | Discharge status: Home / Self Care | Payer: Medicare Other | Source: Ambulatory Visit | Attending: Radiation Oncology | Admitting: Radiation Oncology

## 2018-02-23 VITALS — BP 155/88 | HR 117 | Temp 97.8°F | Resp 18 | Wt 128.9 lb

## 2018-02-23 DIAGNOSIS — Z87891 Personal history of nicotine dependence: Secondary | ICD-10-CM | POA: Insufficient documentation

## 2018-02-23 DIAGNOSIS — C155 Malignant neoplasm of lower third of esophagus: Secondary | ICD-10-CM

## 2018-02-23 DIAGNOSIS — C7951 Secondary malignant neoplasm of bone: Secondary | ICD-10-CM | POA: Diagnosis not present

## 2018-02-23 NOTE — Telephone Encounter (Signed)
Patient is follow-up with me/labs CBC BMP/5-FU infusion over 48 hours;-starting next week ? Monday- 3/4.  Please inform pt/ and schedule. Thx

## 2018-02-23 NOTE — Progress Notes (Signed)
Radiation Oncology Follow up Note  Name: Brittney Jackson   Date:   02/23/2018 MRN:  [id number] DOB: [birth year]    This 84 y.o. female presents to the clinic today for reevaluation for recurrent or persistent esophageal carcinoma distal adenocarcinoma.  REFERRING PROVIDER: Crecencio Mc, MD  HPI: Patient is a 84 year old female now out close to year having completed concurrent chemoradiation for distal esophageal adenocarcinoma.. She received [place] Taxol with radiation therapy and had improvement by PET CT criteria. In November 2018 she developed progression in the mediastinum with endoscopic ultrasound biopsy positive for metastatic adenocarcinoma. She was started on Keytruda although recently on PET/CT review had progression of disease with subcarinal lymph node as well as left scapular glenoid uptake consistent with metastatic disease. She is slated to start 5-FU chemotherapy has had a port placed. She continues have dilatation for dysphagia. Her weight appears stable at this time. She's having minor pain especially on range of motion of her left upper extremity. I been asked to evaluate her for any possibility of further radiation therapy.  COMPLICATIONS OF TREATMENT: none  FOLLOW UP COMPLIANCE: keeps appointments   PHYSICAL EXAM:  BP (!) 155/88   Pulse (!) 117   Temp 97.8 F (36.6 C)   Resp 18   Wt 128 lb 13.7 oz (58.4 kg)   BMI 25.17 kg/m  Patient is a recent port placed in right anterior chest wall. Range of motion of left upper chest does elicit pain motor sensory levels in the upper extremities are equal and symmetric. Well-developed well-nourished patient in NAD. HEENT reveals PERLA, EOMI, discs not visualized.  Oral cavity is clear. No oral mucosal lesions are identified. Neck is clear without evidence of cervical or supraclavicular adenopathy. Lungs are clear to A&P. Cardiac examination is essentially unremarkable with regular rate and rhythm without murmur rub or thrill.  Abdomen is benign with no organomegaly or masses noted. Motor sensory and DTR levels are equal and symmetric in the upper and lower extremities. Cranial nerves II through XII are grossly intact. Proprioception is intact. No peripheral adenopathy or edema is identified. No motor or sensory levels are noted. Crude visual fields are within normal range.  RADIOLOGY RESULTS: PET CT reviewed and compatible with the above-stated findings  PLAN: At the present time like to go ahead with single fraction radiation therapy 800 cGy 1 to her left upper extremity area of involvement of the scapula for palliation. Risks and benefits of treatment including possible skin reaction fatigue were reviewed with the patient. I do not see any role for radiation therapy this time to her aggressive disease in her subcarinal lymph node as well as residual disease in her esophagus since we are at radiation tolerances of her esophagus. Patient is in agreement to go ahead with palliative treatment to her left shoulder. I've discussed the case personally with medical oncology. I have personally set up and ordered CT simulation for early next week.  I would like to take this opportunity to thank you for allowing me to participate in the care of your patient.Noreene Filbert, MD

## 2018-02-23 NOTE — Telephone Encounter (Signed)
done

## 2018-02-25 ENCOUNTER — Telehealth: Payer: Self-pay

## 2018-02-25 NOTE — Telephone Encounter (Signed)
Received call from [place] with questions about numbing her port. Notified that Emla cream was sent to pharmacy on 2/22 as well as antinausea mediation. Educated further on Emla cream and application. Oncology Nurse Navigator Documentation  Navigator Location: [facility] (02/25/18 1400)   )Navigator Encounter Type: Telephone (02/25/18 1400) Telephone: Incoming Call (02/25/18 1400)                                                  Time Spent with Patient: 15 (02/25/18 1400)

## 2018-02-28 ENCOUNTER — Inpatient Hospital Stay: Payer: Medicare Other | Attending: Internal Medicine

## 2018-02-28 ENCOUNTER — Other Ambulatory Visit: Payer: Self-pay

## 2018-02-28 ENCOUNTER — Inpatient Hospital Stay: Payer: Medicare Other

## 2018-02-28 ENCOUNTER — Inpatient Hospital Stay (HOSPITAL_BASED_OUTPATIENT_CLINIC_OR_DEPARTMENT_OTHER): Payer: Medicare Other | Admitting: Internal Medicine

## 2018-02-28 VITALS — BP 188/80 | HR 85 | Temp 98.9°F | Resp 18 | Ht 60.0 in | Wt 127.6 lb

## 2018-02-28 DIAGNOSIS — C155 Malignant neoplasm of lower third of esophagus: Secondary | ICD-10-CM

## 2018-02-28 DIAGNOSIS — I10 Essential (primary) hypertension: Secondary | ICD-10-CM

## 2018-02-28 DIAGNOSIS — Z5111 Encounter for antineoplastic chemotherapy: Secondary | ICD-10-CM | POA: Insufficient documentation

## 2018-02-28 DIAGNOSIS — S42142A Displaced fracture of glenoid cavity of scapula, left shoulder, initial encounter for closed fracture: Secondary | ICD-10-CM

## 2018-02-28 DIAGNOSIS — N289 Disorder of kidney and ureter, unspecified: Secondary | ICD-10-CM

## 2018-02-28 DIAGNOSIS — E039 Hypothyroidism, unspecified: Secondary | ICD-10-CM | POA: Diagnosis not present

## 2018-02-28 DIAGNOSIS — C7989 Secondary malignant neoplasm of other specified sites: Secondary | ICD-10-CM | POA: Diagnosis not present

## 2018-02-28 DIAGNOSIS — R911 Solitary pulmonary nodule: Secondary | ICD-10-CM | POA: Diagnosis not present

## 2018-02-28 LAB — CBC WITH DIFFERENTIAL/PLATELET
BASOS ABS: 0.1 10*3/uL (ref 0–0.1)
Basophils Relative: 1 %
EOS PCT: 1 %
Eosinophils Absolute: 0.1 10*3/uL (ref 0–0.7)
HEMATOCRIT: 36.2 % (ref 35.0–47.0)
HEMOGLOBIN: 12.5 g/dL (ref 12.0–16.0)
LYMPHS PCT: 19 %
Lymphs Abs: 1.3 10*3/uL (ref 1.0–3.6)
MCH: 29.5 pg (ref 26.0–34.0)
MCHC: 34.6 g/dL (ref 32.0–36.0)
MCV: 85.1 fL (ref 80.0–100.0)
Monocytes Absolute: 0.9 10*3/uL (ref 0.2–0.9)
Monocytes Relative: 13 %
NEUTROS ABS: 4.7 10*3/uL (ref 1.4–6.5)
Neutrophils Relative %: 66 %
Platelets: 233 10*3/uL (ref 150–440)
RBC: 4.25 MIL/uL (ref 3.80–5.20)
RDW: 14 % (ref 11.5–14.5)
WBC: 7.1 10*3/uL (ref 3.6–11.0)

## 2018-02-28 LAB — BASIC METABOLIC PANEL
ANION GAP: 9 (ref 5–15)
BUN: 23 mg/dL — AB (ref 6–20)
CO2: 22 mmol/L (ref 22–32)
Calcium: 9 mg/dL (ref 8.9–10.3)
Chloride: 103 mmol/L (ref 101–111)
Creatinine, Ser: 1.05 mg/dL — ABNORMAL HIGH (ref 0.44–1.00)
GFR calc Af Amer: 55 mL/min — ABNORMAL LOW (ref >60)
GFR, EST NON AFRICAN AMERICAN: 47 mL/min — AB (ref >60)
Glucose, Bld: 134 mg/dL — ABNORMAL HIGH (ref 65–99)
POTASSIUM: 3.9 mmol/L (ref 3.5–5.1)
SODIUM: 134 mmol/L — AB (ref 135–145)

## 2018-02-28 MED ORDER — SODIUM CHLORIDE 0.9 % IV SOLN: Freq: Once | INTRAVENOUS | Status: DC

## 2018-02-28 MED ORDER — DEXTROSE 5 % IV SOLN
Freq: Once | INTRAVENOUS | Status: AC
  Administered 2018-02-28: 11:00:00 via INTRAVENOUS
  Filled 2018-02-28: qty 1000

## 2018-02-28 MED ORDER — LEUCOVORIN CALCIUM INJECTION 350 MG: 400.0000 mg/m2 | Freq: Once | INTRAVENOUS | Status: DC

## 2018-02-28 MED ORDER — ONDANSETRON HCL 4 MG/2ML IJ SOLN
8.0000 mg | Freq: Once | INTRAMUSCULAR | Status: AC
  Administered 2018-02-28: 8 mg via INTRAVENOUS
  Filled 2018-02-28: qty 4

## 2018-02-28 MED ORDER — LEUCOVORIN CALCIUM INJECTION 100 MG
20.0000 mg/m2 | Freq: Once | INTRAMUSCULAR | Status: AC
  Administered 2018-02-28: 32 mg via INTRAVENOUS
  Filled 2018-02-28: qty 1.6

## 2018-02-28 MED ORDER — SODIUM CHLORIDE 0.9 % IV SOLN
2400.0000 mg/m2 | INTRAVENOUS | Status: DC
  Administered 2018-02-28: 3750 mg via INTRAVENOUS
  Filled 2018-02-28: qty 75

## 2018-02-28 NOTE — Assessment & Plan Note (Addendum)
#  Recurrent/metastatic adenocarcinoma of the esophagus- on s/p cycle #4 [place]; February 2019 PET scan progression-subcarinal lymph node progression; also left scapular/glenoid uptake.  #Recommend proceeding with systemic therapy with 5-FU continuous infusion; I do not think patient is a good candidate for oxaliplatin given her age  # Difficulty swallowing - second malignancy/radiation- status post dilatation x 4; improved [last jan 2019]  #Chronic hypertension- elevated in 160ss; as per patient at home currently blood pressure is 130s to 140s.  # Left lower lobe centimeter lung nodule- question etiology. Stable.  # Left renal mass- suspicious for malignancy.  Stable on most recent PET scan.  # Hypothyroidism- slightly low TSH-recently Synthroid adjusted to 50 mcg.  Will repeat again at next visit.   # will send for omniseq testing; pt agrees.   # follow up in 2 weeks/labs/ thyroid profile.   Cc; Dr.Tullo/ [place]/Dr.Lateef.

## 2018-02-28 NOTE — Progress Notes (Signed)
[place] NOTE  Patient Care Team: Crecencio Mc, MD as PCP - General (Internal Medicine) Clent Jacks, RN as Registered Nurse Anthonette Legato, MD (Internal Medicine)  CHIEF COMPLAINTS/PURPOSE OF CONSULTATION: Esophageal cancer  #  Oncology History   # MARCH 2018- ADENO CA; mod diff [lower third of esophagus; EGD Bx; Dr.Wohl]; CT- A/P- no distant mets/LN. PET- TxN1; no EUS.  # April 5th 2018- Carbo-taxol with RT [until May 22nd 2018]; July 12h PET-improved.   # NOV 5th PET- Progression in mediastinum; EUS Bx- METASTATIC ADENO CA [Dr.Jowell; [facility]]  # NOV 30th 2018- Keytruda; FEB 20th 2019- PET progressive subcarinal lymph node; and also left scapular/glenoid uptake  # march 1st week- 5-FU CIV q2 W   # Dysphagia- s/p EGD [Dr. Vicente Males; July 2018]; Repeat EGD [Dr.Wohl] s/p dilatation.  # March 2018- LEFT KIDNEY COMPLEX MASS ~2cm [incidental]; ~ 2 cm left lower lobe nodule [question second primary]  # Hx of Hyponatremia [~[phone]; Dr.Lateef]  # MOLECULAR TESTING- Her 2 neu-NEGATIVE; PDL-1- POSITIVE [CPS-2]; MMR- STABLE.      Malignant neoplasm of lower third of esophagus (HCC)     HISTORY OF PRESENTING ILLNESS:  Brittney Jackson 84 y.o.  female metastatic/recurrent esophageal adenocarcinoma-on Keytruda-noted to have progression on recent imaging.   Patient is here to proceed with 5-FU continuous infusion chemotherapy.  In the interim patient has a port placed.  Patient also has been evaluated by radiation for her left scapula uptake.   Patient denies any pain anywhere.  No worsening difficulty swallowing.  ROS: A complete 10 point review of system is done which is negative except mentioned above in history of present illness  MEDICAL HISTORY:  Past Medical History:  Diagnosis Date  . Arthritis    left knee  . Cancer ([place])    esophageal cancer  . Diverticulitis   . Dysphagia   . Function kidney decreased   . Hypertension   .  Hypothyroidism   . Inappropriate ADH secretion (HCC)   . Low sodium levels   . Osteoporosis   . Renal disorder     SURGICAL HISTORY: Past Surgical History:  Procedure Laterality Date  . CATARACT EXTRACTION W/PHACO Left 05/29/2015   Procedure: CATARACT EXTRACTION PHACO AND INTRAOCULAR LENS PLACEMENT (IOC);  Surgeon: Leandrew Koyanagi, MD;  Location: [place];  Service: Ophthalmology;  Laterality: Left;  . CATARACT EXTRACTION W/PHACO Right 01/15/2016   Procedure: CATARACT EXTRACTION PHACO AND INTRAOCULAR LENS PLACEMENT (IOC);  Surgeon: Leandrew Koyanagi, MD;  Location: [place];  Service: Ophthalmology;  Laterality: Right;  [place]  . ESOPHAGOGASTRODUODENOSCOPY (EGD) WITH PROPOFOL N/A 03/16/2017   Procedure: ESOPHAGOGASTRODUODENOSCOPY (EGD) WITH PROPOFOL;  Surgeon: [name], MD;  Location: [facility] ENDOSCOPY;  Service: Endoscopy;  Laterality: N/A;  . ESOPHAGOGASTRODUODENOSCOPY (EGD) WITH PROPOFOL N/A 06/25/2017   Procedure: ESOPHAGOGASTRODUODENOSCOPY (EGD) WITH PROPOFOL;  Surgeon: Jonathon Bellows, MD;  Location: [facility] ENDOSCOPY;  Service: Endoscopy;  Laterality: N/A;  . ESOPHAGOGASTRODUODENOSCOPY (EGD) WITH PROPOFOL N/A 07/14/2017   Procedure: ESOPHAGOGASTRODUODENOSCOPY (EGD) WITH PROPOFOL with dilation;  Surgeon: [name], MD;  Location: [facility] ENDOSCOPY;  Service: Endoscopy;  Laterality: N/A;  . ESOPHAGOGASTRODUODENOSCOPY (EGD) WITH PROPOFOL N/A 08/31/2017   Procedure: ESOPHAGOGASTRODUODENOSCOPY (EGD) WITH PROPOFOL;  Surgeon: [name], MD;  Location: [facility] ENDOSCOPY;  Service: Endoscopy;  Laterality: N/A;  . ESOPHAGOGASTRODUODENOSCOPY (EGD) WITH PROPOFOL N/A 09/21/2017   Procedure: ESOPHAGOGASTRODUODENOSCOPY (EGD) WITH PROPOFOL;  Surgeon: [name], MD;  Location: [facility] ENDOSCOPY;  Service: Endoscopy;  Laterality: N/A;  . ESOPHAGOGASTRODUODENOSCOPY (EGD) WITH  PROPOFOL N/A 10/12/2017   Procedure: ESOPHAGOGASTRODUODENOSCOPY (EGD) WITH PROPOFOL;  Surgeon: [name], MD;   Location: [facility] ENDOSCOPY;  Service: Endoscopy;  Laterality: N/A;  . ESOPHAGOGASTRODUODENOSCOPY (EGD) WITH PROPOFOL N/A 01/18/2018   Procedure: ESOPHAGOGASTRODUODENOSCOPY (EGD) WITH PROPOFOL;  Surgeon: [name], MD;  Location: [facility] ENDOSCOPY;  Service: Endoscopy;  Laterality: N/A;  . EYE SURGERY    . IR FLUORO GUIDE PORT INSERTION RIGHT  02/22/2018  . REFRACTIVE SURGERY    . 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 Family is in the area.  Social History   Socioeconomic History  . Marital status: Widowed    Spouse name: Not on file  . Number of children: Not on file  . Years of education: Not on file  . Highest education level: Not on file  Social Needs  . Financial resource strain: Not on file  . Food insecurity - worry: Not on file  . Food insecurity - inability: Not on file  . Transportation needs - medical: Not on file  . Transportation needs - non-medical: Not on file  Occupational History  . 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 Smoking status: Former Smoker    Types: Cigarettes    Last attempt to quit: 07/21/2000    Years since quitting: 17.6  . Smokeless tobacco: Never Used  Substance and Sexual Activity  . Alcohol use: No  . Drug use: No  . Sexual activity: No  Other Topics Concern  . Not on file  Social History Narrative   Church activities, community service.   Lives alone.    FAMILY HISTORY: Family History  Problem Relation Age of Onset  . Heart attack Mother   . Cancer Maternal Uncle        renal cancer   . COPD Neg Hx        no breast, ovarian or colon    ALLERGIES:  is allergic to amlodipine.  MEDICATIONS:  Current Outpatient Medications  Medication Sig Dispense Refill  . levothyroxine (SYNTHROID) 50 MCG tablet Take 1 tablet (50 mcg total) by mouth daily before breakfast. 30 tablet 3  . lidocaine-prilocaine (EMLA) cream Apply 1 application  topically as needed. Apply generously over the Mediport 45 minutes prior to chemotherapy. 30 g 0  . omeprazole (PRILOSEC OTC) 20 MG tablet Take 20 mg by mouth daily. Chew tablets    . ondansetron (ZOFRAN) 8 MG tablet One pill every 8 hours as needed for nausea/vomitting. (Patient not taking: Reported on 02/22/2018) 40 tablet 1  . potassium chloride SA (K-DUR,KLOR-CON) 20 MEQ tablet Take 1 tablet (20 mEq total) by mouth 2 (two) times daily. (Patient not taking: Reported on 02/28/2018) 30 tablet 3  . prochlorperazine (COMPAZINE) 10 MG tablet Take 1 tablet (10 mg total) by mouth every 6 (six) hours as needed for nausea or vomiting. (Patient not taking: Reported on 02/22/2018) 40 tablet 1  . sucralfate (CARAFATE) 1 g tablet Take 1 tablet (1 g total) by mouth 4 (four) times daily -  with meals and at bedtime. Dissolve in warm water, swish and swallow (Patient not taking: Reported on 02/28/2018) 90 tablet 3   No current facility-administered medications for this visit.    Facility-Administered Medications Ordered in Other Visits  Medication Dose Route Frequency Provider Last Rate Last Dose  . fluorouracil (ADRUCIL) 3,750 mg in sodium chloride 0.9 % 75 mL chemo infusion  2,400 mg/m2 (Treatment Plan Recorded)  Intravenous 1 day or 1 dose Cammie Sickle, MD   3,750 mg at 02/28/18 1203      .  PHYSICAL EXAMINATION: ECOG PERFORMANCE STATUS: 0 - Asymptomatic  Vitals:   02/28/18 1047  BP: (!) 188/80  Pulse: 85  Resp: 18  Temp: 98.9 F (37.2 C)   Filed Weights   02/28/18 1047  Weight: 127 lb 9.6 oz (57.9 kg)    GENERAL: Well-nourished well-developed; Alert, no distress and comfortable. She is accompanied by daughter. EYES: no pallor or icterus OROPHARYNX: no thrush or ulceration; good dentition  NECK: supple, no masses felt LYMPH:  no palpable lymphadenopathy in the cervical, axillary or inguinal regions LUNGS: clear to auscultation and  No wheeze or crackles HEART/CVS: regular rate & rhythm  and no murmurs; no swelling bilateral lower extremities ABDOMEN: abdomen soft, non-tender and normal bowel sounds Musculoskeletal:no cyanosis of digits and no clubbing  PSYCH: alert & oriented x 3 with fluent speech NEURO: no focal motor/sensory deficits SKIN:  no rashes or significant lesions  LABORATORY DATA:  I have reviewed the data as listed Lab Results  Component Value Date   WBC 7.1 02/28/2018   HGB 12.5 02/28/2018   HCT 36.2 02/28/2018   MCV 85.1 02/28/2018   PLT 233 02/28/2018   Recent Labs    07/03/17 1532  07/05/17 1655  01/06/18 0925 01/27/18 1052 02/17/18 1030 02/22/18 1503 02/28/18 1005  NA 131*   < >  --    < > 136 138 138 139 134*  K 3.1*   < >  --    < > 3.6 3.9 3.8 4.1 3.9  CL 93*   < >  --    < > 101 102 103 103 103  CO2 29   < >  --    < > 25 26 28 25 22   GLUCOSE 120*   < >  --    < > 85 140* 111* 107* 134*  BUN 16   < >  --    < > 27* 27* 23* 22* 23*  CREATININE 0.90   < >  --    < > 1.04* 0.96 1.06* 0.96 1.05*  CALCIUM 9.1   < >  --    < > 9.3 9.1 9.3 9.5 9.0  GFRNONAA 57*   < >  --    < > 48* 52* 47* 52* 47*  GFRAA >60   < >  --    < > 55* >60 54* >60 55*  PROT 7.3  --  7.1   < > 7.3 7.3 7.5  --   --   ALBUMIN 3.5  --  3.3*   < > 3.9 3.7 3.7  --   --   AST 37  --  25   < > 25 27 25   --   --   ALT 24  --  20   < > 16 15 14   --   --   ALKPHOS 108  --  110   < > 157* 154* 163*  --   --   BILITOT 0.6  --  0.6   < > 0.5 0.4 0.3  --   --   BILIDIR <0.1*  --  0.1  --   --   --   --   --   --   IBILI NOT CALCULATED  --  0.5  --   --   --   --   --   --    < > =  values in this interval not displayed.    RADIOGRAPHIC STUDIES: I have personally reviewed the radiological images as listed and agreed with the findings in the report. Nm Pet Image Restag (ps) Skull Base To Thigh  Result Date: 02/14/2018 CLINICAL DATA:  Subsequent treatment strategy for esophageal neoplasm. EXAM: NUCLEAR MEDICINE PET SKULL BASE TO THIGH TECHNIQUE: 12.745 mCi F-18 FDG was  injected intravenously. Full-ring PET imaging was performed from the skull base to thigh after the radiotracer. CT data was obtained and used for attenuation correction and anatomic localization. FASTING BLOOD GLUCOSE:  Value: 105 mg/dl COMPARISON:  PET-CT 11/03/2017. FINDINGS: NECK: No hypermetabolic lymph nodes in the neck. CHEST: Diffuse circumferential thickening of the distal esophagus is hypermetabolic (SUVmax = 7.2) , compatible with residual tumor. Borderline enlarged (13 mm short axis) subcarinal lymph node is markedly hypermetabolic (SUVmax = 8.7) . no other lymphadenopathy is noted in the thorax. Tiny 4 mm nodules in the right middle lobe (axial image 101 of series 3) and in the left lower lobe (image 102 of series 3), unchanged compared to the prior study. No other larger more suspicious appearing pulmonary nodules or masses. Linear scarring in the lower lobes of the lungs bilaterally. No acute consolidative airspace disease. No pleural effusions. There is aortic atherosclerosis, as well as atherosclerosis of the great vessels of the mediastinum and the coronary arteries, including calcified atherosclerotic plaque in the left main, left anterior descending, left circumflex and right coronary arteries. Mild calcifications of the aortic valve. ABDOMEN/PELVIS: No abnormal hypermetabolic activity within the liver, pancreas, adrenal glands, or spleen. No hypermetabolic lymph nodes in the abdomen or pelvis. Aortic atherosclerosis, without evidence of aneurysm throughout the abdomen and pelvis. Numerous colonic diverticulae are noted, without surrounding inflammatory changes to suggest an acute diverticulitis at this time. SKELETON: New area of sclerosis and hypermetabolism (SUVmax = 6.3) in the left glenoid, suspicious for a new metastatic lesion. IMPRESSION: 1. Increasing metabolic activity both in the primary lesion in the distal esophagus and in the borderline enlarged subcarinal lymph node when compared to  prior study from 11/03/2017. 2. New area of sclerosis and hypermetabolism in the left glenoid, suspicious for a new left scapular metastasis. 3. Aortic atherosclerosis, in addition to left main and 3 vessel coronary artery disease. 4. There are calcifications of the aortic valve. Echocardiographic correlation for evaluation of potential valvular dysfunction may be warranted if clinically indicated. 5. Colonic diverticulosis without evidence of acute diverticulitis at this time. Aortic Atherosclerosis (ICD10-I70.0). Electronically Signed   By: Vinnie Langton M.D.   On: 02/14/2018 12:24   Ir Fluoro Guide Port Insertion Right  Result Date: 02/22/2018 INDICATION: 84 year old with esophageal cancer. Port-A-Cath needed for treatment. EXAM: FLUOROSCOPIC AND ULTRASOUND GUIDED PLACEMENT OF A SUBCUTANEOUS PORT COMPARISON:  None. MEDICATIONS: Ancef 2 g; The antibiotic was administered within an appropriate time interval prior to skin puncture. ANESTHESIA/SEDATION: Versed 1.0 mg IV; Fentanyl 25 mcg IV; Moderate Sedation Time:  21 minutes The patient was continuously monitored during the procedure by the interventional radiology nurse under my direct supervision. FLUOROSCOPY TIME:  16 seconds, 2 mGy COMPLICATIONS: None immediate. PROCEDURE: The procedure, risks, benefits, and alternatives were explained to the patient. Questions regarding the procedure were encouraged and answered. The patient understands and consents to the procedure. Patient was placed supine on the interventional table. Ultrasound confirmed a patent right internal jugular vein. The right chest and neck were cleaned with a skin antiseptic and a sterile drape was placed. Maximal barrier sterile technique was utilized including  caps, mask, sterile gowns, sterile gloves, sterile drape, hand hygiene and skin antiseptic. The right neck was anesthetized with 1% lidocaine. Small incision was made in the right neck with a blade. Micropuncture set was placed in  the right internal jugular vein with ultrasound guidance. The micropuncture wire was used for measurement purposes. The right chest was anesthetized with 1% lidocaine with epinephrine. #15 blade was used to make an incision and a subcutaneous port pocket was formed. [place] was assembled. Subcutaneous tunnel was formed with a stiff tunneling device. The port catheter was brought through the subcutaneous tunnel. The port was placed in the subcutaneous pocket. The micropuncture set was exchanged for a peel-away sheath. The catheter was placed through the peel-away sheath and the tip was positioned at the superior cavoatrial junction. Catheter placement was confirmed with fluoroscopy. The port was accessed and flushed with heparinized saline. The port pocket was closed using two layers of absorbable sutures and Dermabond. The vein skin site was closed using a single layer of absorbable suture and Dermabond. Sterile dressings were applied. Patient tolerated the procedure well without an immediate complication. Ultrasound and fluoroscopic images were taken and saved for this procedure. IMPRESSION: Placement of a subcutaneous CT injectable port device. Electronically Signed   By: Markus Daft M.D.   On: 02/22/2018 17:40    ASSESSMENT & PLAN:   Malignant neoplasm of lower third of esophagus (HCC) #Recurrent/metastatic adenocarcinoma of the esophagus- on s/p cycle #4 Beryle Flock; February 2019 PET scan progression-subcarinal lymph node progression; also left scapular/glenoid uptake.  #Recommend proceeding with systemic therapy with 5-FU continuous infusion; I do not think patient is a good candidate for oxaliplatin given her age  # Difficulty swallowing - second malignancy/radiation- status post dilatation x 4; improved [last jan 2019]  #Chronic hypertension- elevated in 160ss; as per patient at home currently blood pressure is 130s to 140s.  # Left lower lobe centimeter lung nodule- question etiology.  Stable.  # Left renal mass- suspicious for malignancy.  Stable on most recent PET scan.  # Hypothyroidism- slightly low TSH-recently Synthroid adjusted to 50 mcg.  Will repeat again at next visit.   # will send for omniseq testing; pt agrees.   # follow up in 2 weeks/labs/ thyroid profile.   Cc; Dr.Tullo/ Wohl/Dr.Lateef.       Cammie Sickle, MD 02/28/2018 1:04 PM

## 2018-03-01 ENCOUNTER — Ambulatory Visit
Admission: RE | Admit: 2018-03-01 | Discharge: 2018-03-01 | Disposition: A | Discharge status: Home / Self Care | Payer: Medicare Other | Source: Ambulatory Visit | Attending: Radiation Oncology | Admitting: Radiation Oncology

## 2018-03-01 DIAGNOSIS — R911 Solitary pulmonary nodule: Secondary | ICD-10-CM | POA: Insufficient documentation

## 2018-03-01 DIAGNOSIS — C7951 Secondary malignant neoplasm of bone: Secondary | ICD-10-CM | POA: Diagnosis not present

## 2018-03-01 DIAGNOSIS — C155 Malignant neoplasm of lower third of esophagus: Secondary | ICD-10-CM | POA: Insufficient documentation

## 2018-03-01 DIAGNOSIS — E039 Hypothyroidism, unspecified: Secondary | ICD-10-CM | POA: Insufficient documentation

## 2018-03-01 DIAGNOSIS — N289 Disorder of kidney and ureter, unspecified: Secondary | ICD-10-CM | POA: Diagnosis not present

## 2018-03-01 DIAGNOSIS — Z87891 Personal history of nicotine dependence: Secondary | ICD-10-CM | POA: Diagnosis not present

## 2018-03-01 DIAGNOSIS — Z510 Encounter for antineoplastic radiation therapy: Secondary | ICD-10-CM | POA: Insufficient documentation

## 2018-03-02 ENCOUNTER — Inpatient Hospital Stay: Payer: Medicare Other

## 2018-03-02 ENCOUNTER — Telehealth: Payer: Self-pay | Admitting: Gastroenterology

## 2018-03-02 VITALS — BP 190/72 | HR 90 | Temp 96.7°F | Resp 18

## 2018-03-02 DIAGNOSIS — Z5111 Encounter for antineoplastic chemotherapy: Secondary | ICD-10-CM | POA: Diagnosis not present

## 2018-03-02 DIAGNOSIS — C7989 Secondary malignant neoplasm of other specified sites: Secondary | ICD-10-CM | POA: Diagnosis not present

## 2018-03-02 DIAGNOSIS — C155 Malignant neoplasm of lower third of esophagus: Secondary | ICD-10-CM

## 2018-03-02 MED ORDER — SODIUM CHLORIDE 0.9% FLUSH
10.0000 mL | INTRAVENOUS | Status: DC | PRN
  Administered 2018-03-02: 10 mL via INTRAVENOUS
  Filled 2018-03-02: qty 10

## 2018-03-02 MED ORDER — HEPARIN SOD (PORK) LOCK FLUSH 100 UNIT/ML IV SOLN
500.0000 [IU] | Freq: Once | INTRAVENOUS | Status: AC
  Administered 2018-03-02: 500 [IU] via INTRAVENOUS
  Filled 2018-03-02: qty 5

## 2018-03-02 NOTE — Telephone Encounter (Signed)
Brittney Jackson is having difficutly swallowing needs a EGD to stretch her esophagus.

## 2018-03-03 ENCOUNTER — Other Ambulatory Visit: Payer: Self-pay

## 2018-03-03 DIAGNOSIS — C155 Malignant neoplasm of lower third of esophagus: Secondary | ICD-10-CM | POA: Diagnosis not present

## 2018-03-03 DIAGNOSIS — N289 Disorder of kidney and ureter, unspecified: Secondary | ICD-10-CM | POA: Diagnosis not present

## 2018-03-03 DIAGNOSIS — Z510 Encounter for antineoplastic radiation therapy: Secondary | ICD-10-CM | POA: Diagnosis not present

## 2018-03-03 DIAGNOSIS — C7951 Secondary malignant neoplasm of bone: Secondary | ICD-10-CM | POA: Diagnosis not present

## 2018-03-03 DIAGNOSIS — R911 Solitary pulmonary nodule: Secondary | ICD-10-CM | POA: Diagnosis not present

## 2018-03-03 DIAGNOSIS — K222 Esophageal obstruction: Secondary | ICD-10-CM

## 2018-03-03 DIAGNOSIS — E039 Hypothyroidism, unspecified: Secondary | ICD-10-CM | POA: Diagnosis not present

## 2018-03-03 DIAGNOSIS — Z87891 Personal history of nicotine dependence: Secondary | ICD-10-CM | POA: Diagnosis not present

## 2018-03-03 NOTE — Telephone Encounter (Signed)
Please call [name] [phone] to schedule an EGD for her mother. She was expecting you to call yesterday.

## 2018-03-03 NOTE — Telephone Encounter (Signed)
Pt has been scheduled for an EGD with Wohl on 03/09/18.

## 2018-03-07 ENCOUNTER — Ambulatory Visit
Admission: RE | Admit: 2018-03-07 | Discharge: 2018-03-07 | Disposition: A | Discharge status: Home / Self Care | Payer: Medicare Other | Source: Ambulatory Visit | Attending: Radiation Oncology | Admitting: Radiation Oncology

## 2018-03-07 DIAGNOSIS — C7951 Secondary malignant neoplasm of bone: Secondary | ICD-10-CM | POA: Diagnosis not present

## 2018-03-07 DIAGNOSIS — Z510 Encounter for antineoplastic radiation therapy: Secondary | ICD-10-CM | POA: Diagnosis not present

## 2018-03-07 DIAGNOSIS — N2581 Secondary hyperparathyroidism of renal origin: Secondary | ICD-10-CM | POA: Diagnosis not present

## 2018-03-07 DIAGNOSIS — E039 Hypothyroidism, unspecified: Secondary | ICD-10-CM | POA: Diagnosis not present

## 2018-03-07 DIAGNOSIS — R911 Solitary pulmonary nodule: Secondary | ICD-10-CM | POA: Diagnosis not present

## 2018-03-07 DIAGNOSIS — E871 Hypo-osmolality and hyponatremia: Secondary | ICD-10-CM | POA: Diagnosis not present

## 2018-03-07 DIAGNOSIS — I10 Essential (primary) hypertension: Secondary | ICD-10-CM | POA: Diagnosis not present

## 2018-03-07 DIAGNOSIS — C155 Malignant neoplasm of lower third of esophagus: Secondary | ICD-10-CM | POA: Diagnosis not present

## 2018-03-07 DIAGNOSIS — N183 Chronic kidney disease, stage 3 (moderate): Secondary | ICD-10-CM | POA: Diagnosis not present

## 2018-03-07 DIAGNOSIS — Z87891 Personal history of nicotine dependence: Secondary | ICD-10-CM | POA: Diagnosis not present

## 2018-03-07 DIAGNOSIS — N289 Disorder of kidney and ureter, unspecified: Secondary | ICD-10-CM | POA: Diagnosis not present

## 2018-03-08 ENCOUNTER — Ambulatory Visit: Payer: Medicare Other | Admitting: Internal Medicine

## 2018-03-09 ENCOUNTER — Encounter
Admission: RE | Disposition: A | Discharge status: Home / Self Care | Payer: Self-pay | Source: Ambulatory Visit | Attending: Gastroenterology

## 2018-03-09 ENCOUNTER — Encounter: Payer: Self-pay | Admitting: *Deleted

## 2018-03-09 ENCOUNTER — Ambulatory Visit
Admission: RE | Admit: 2018-03-09 | Discharge: 2018-03-09 | Disposition: A | Discharge status: Home / Self Care | Payer: Medicare Other | Source: Ambulatory Visit | Attending: Gastroenterology | Admitting: Gastroenterology

## 2018-03-09 ENCOUNTER — Other Ambulatory Visit: Payer: Self-pay

## 2018-03-09 ENCOUNTER — Ambulatory Visit: Payer: Medicare Other | Admitting: Anesthesiology

## 2018-03-09 DIAGNOSIS — Z9842 Cataract extraction status, left eye: Secondary | ICD-10-CM | POA: Insufficient documentation

## 2018-03-09 DIAGNOSIS — K222 Esophageal obstruction: Secondary | ICD-10-CM

## 2018-03-09 DIAGNOSIS — I10 Essential (primary) hypertension: Secondary | ICD-10-CM | POA: Insufficient documentation

## 2018-03-09 DIAGNOSIS — E785 Hyperlipidemia, unspecified: Secondary | ICD-10-CM | POA: Diagnosis not present

## 2018-03-09 DIAGNOSIS — E039 Hypothyroidism, unspecified: Secondary | ICD-10-CM | POA: Insufficient documentation

## 2018-03-09 DIAGNOSIS — K449 Diaphragmatic hernia without obstruction or gangrene: Secondary | ICD-10-CM | POA: Insufficient documentation

## 2018-03-09 DIAGNOSIS — N2889 Other specified disorders of kidney and ureter: Secondary | ICD-10-CM | POA: Insufficient documentation

## 2018-03-09 DIAGNOSIS — Z9841 Cataract extraction status, right eye: Secondary | ICD-10-CM | POA: Diagnosis not present

## 2018-03-09 DIAGNOSIS — K2270 Barrett's esophagus without dysplasia: Secondary | ICD-10-CM | POA: Insufficient documentation

## 2018-03-09 DIAGNOSIS — Z87891 Personal history of nicotine dependence: Secondary | ICD-10-CM | POA: Insufficient documentation

## 2018-03-09 DIAGNOSIS — M1712 Unilateral primary osteoarthritis, left knee: Secondary | ICD-10-CM | POA: Diagnosis not present

## 2018-03-09 DIAGNOSIS — E222 Syndrome of inappropriate secretion of antidiuretic hormone: Secondary | ICD-10-CM | POA: Insufficient documentation

## 2018-03-09 DIAGNOSIS — C159 Malignant neoplasm of esophagus, unspecified: Secondary | ICD-10-CM | POA: Diagnosis not present

## 2018-03-09 DIAGNOSIS — R1310 Dysphagia, unspecified: Secondary | ICD-10-CM

## 2018-03-09 DIAGNOSIS — Z8051 Family history of malignant neoplasm of kidney: Secondary | ICD-10-CM | POA: Insufficient documentation

## 2018-03-09 DIAGNOSIS — Z79899 Other long term (current) drug therapy: Secondary | ICD-10-CM | POA: Diagnosis not present

## 2018-03-09 DIAGNOSIS — Z888 Allergy status to other drugs, medicaments and biological substances status: Secondary | ICD-10-CM | POA: Diagnosis not present

## 2018-03-09 DIAGNOSIS — Z8249 Family history of ischemic heart disease and other diseases of the circulatory system: Secondary | ICD-10-CM | POA: Insufficient documentation

## 2018-03-09 DIAGNOSIS — M810 Age-related osteoporosis without current pathological fracture: Secondary | ICD-10-CM | POA: Diagnosis not present

## 2018-03-09 DIAGNOSIS — Z825 Family history of asthma and other chronic lower respiratory diseases: Secondary | ICD-10-CM | POA: Diagnosis not present

## 2018-03-09 HISTORY — PX: ESOPHAGOGASTRODUODENOSCOPY (EGD) WITH PROPOFOL: SHX5813

## 2018-03-09 SURGERY — ESOPHAGOGASTRODUODENOSCOPY (EGD) WITH PROPOFOL
Anesthesia: General

## 2018-03-09 MED ORDER — PROPOFOL 10 MG/ML IV BOLUS
INTRAVENOUS | Status: DC | PRN
  Administered 2018-03-09: 40 mg via INTRAVENOUS

## 2018-03-09 MED ORDER — ONDANSETRON HCL 4 MG/2ML IJ SOLN
INTRAMUSCULAR | Status: DC | PRN
  Administered 2018-03-09: 4 mg via INTRAVENOUS

## 2018-03-09 MED ORDER — LIDOCAINE HCL (PF) 2 % IJ SOLN
INTRAMUSCULAR | Status: AC
  Filled 2018-03-09: qty 10

## 2018-03-09 MED ORDER — METOPROLOL TARTRATE 5 MG/5ML IV SOLN
INTRAVENOUS | Status: DC | PRN
  Administered 2018-03-09: 2 mg via INTRAVENOUS

## 2018-03-09 MED ORDER — METOPROLOL TARTRATE 5 MG/5ML IV SOLN
INTRAVENOUS | Status: AC
  Filled 2018-03-09: qty 5

## 2018-03-09 MED ORDER — SODIUM CHLORIDE 0.9 % IV SOLN
INTRAVENOUS | Status: DC
  Administered 2018-03-09: 10:00:00 via INTRAVENOUS

## 2018-03-09 MED ORDER — PROPOFOL 500 MG/50ML IV EMUL
INTRAVENOUS | Status: DC | PRN
  Administered 2018-03-09: 125 ug/kg/min via INTRAVENOUS

## 2018-03-09 MED ORDER — LIDOCAINE HCL (CARDIAC) 20 MG/ML IV SOLN
INTRAVENOUS | Status: DC | PRN
  Administered 2018-03-09: 100 mg via INTRAVENOUS

## 2018-03-09 MED ORDER — PROPOFOL 10 MG/ML IV BOLUS
INTRAVENOUS | Status: AC
  Filled 2018-03-09: qty 20

## 2018-03-09 NOTE — Transfer of Care (Signed)
Immediate Anesthesia Transfer of Care Note  Patient: Brittney Jackson  Procedure(s) Performed: ESOPHAGOGASTRODUODENOSCOPY (EGD) WITH PROPOFOL (N/A )  Patient Location: PACU and Endoscopy Unit  Anesthesia Type:General  Level of Consciousness: awake  Airway & Oxygen Therapy: Patient Spontanous Breathing  Post-op Assessment: Report given to RN  Post vital signs: stable  Last Vitals:  Vitals:   03/09/18 0959 03/09/18 1109  BP: (!) 217/81   Pulse: 83   Resp: 18   Temp: (!) 36.2 C (!) 36.1 C  SpO2: 99%     Last Pain:  Vitals:   03/09/18 0959  TempSrc: Tympanic         Complications: No apparent anesthesia complications

## 2018-03-09 NOTE — Anesthesia Post-op Follow-up Note (Signed)
Anesthesia QCDR form completed.        

## 2018-03-09 NOTE — Anesthesia Postprocedure Evaluation (Signed)
Anesthesia Post Note  Patient: Brittney Jackson  Procedure(s) Performed: ESOPHAGOGASTRODUODENOSCOPY (EGD) WITH PROPOFOL (N/A )  Patient location during evaluation: Endoscopy Anesthesia Type: General Level of consciousness: awake and alert Pain management: pain level controlled Vital Signs Assessment: post-procedure vital signs reviewed and stable Respiratory status: spontaneous breathing, nonlabored ventilation, respiratory function stable and patient connected to nasal cannula oxygen Cardiovascular status: blood pressure returned to baseline and stable Postop Assessment: no apparent nausea or vomiting Anesthetic complications: no     Last Vitals:  Vitals:   03/09/18 1119 03/09/18 1129  BP: (!) 194/100 (!) 202/89  Pulse: 80 70  Resp: (!) 32 14  Temp:    SpO2: 98% 98%    Last Pain:  Vitals:   03/09/18 1109  TempSrc: Tympanic                 [name]

## 2018-03-09 NOTE — Anesthesia Preprocedure Evaluation (Signed)
Anesthesia Evaluation  Patient identified by MRN, date of birth, ID band Patient awake    Reviewed: Allergy & Precautions, H&P , NPO status , Patient's Chart, lab work & pertinent test results  History of Anesthesia Complications (+) PONV and history of anesthetic complications  Airway Mallampati: III  TM Distance: <3 FB Neck ROM: limited    Dental  (+) Poor Dentition, Chipped   Pulmonary neg shortness of breath, former smoker,           Cardiovascular Exercise Tolerance: Good hypertension, (-) angina(-) Past MI and (-) DOE      Neuro/Psych negative neurological ROS  negative psych ROS   GI/Hepatic negative GI ROS, Neg liver ROS,   Endo/Other  Hypothyroidism   Renal/GU Renal disease  negative genitourinary   Musculoskeletal  (+) Arthritis ,   Abdominal   Peds  Hematology negative hematology ROS (+)   Anesthesia Other Findings Past Medical History: No date: Arthritis     Comment:  left knee No date: Cancer (HCC)     Comment:  esophageal cancer No date: Diverticulitis No date: Dysphagia No date: Function kidney decreased No date: Hypertension No date: Hypothyroidism No date: Inappropriate ADH secretion (HCC) No date: Low sodium levels No date: Osteoporosis No date: Renal disorder  Past Surgical History: 05/29/2015: CATARACT EXTRACTION W/PHACO; Left     Comment:  Procedure: CATARACT EXTRACTION PHACO AND INTRAOCULAR               LENS PLACEMENT (IOC);  Surgeon: Leandrew Koyanagi, MD;               Location: [place];  Service: Ophthalmology;                Laterality: Left; 01/15/2016: CATARACT EXTRACTION W/PHACO; Right     Comment:  Procedure: CATARACT EXTRACTION PHACO AND INTRAOCULAR               LENS PLACEMENT (IOC);  Surgeon: Leandrew Koyanagi, MD;               Location: [place];  Service: Ophthalmology;                Laterality: Right;  [place] 03/16/2017:  ESOPHAGOGASTRODUODENOSCOPY (EGD) WITH PROPOFOL; N/A     Comment:  Procedure: ESOPHAGOGASTRODUODENOSCOPY (EGD) WITH               PROPOFOL;  Surgeon: [name], MD;  Location: [facility]               ENDOSCOPY;  Service: Endoscopy;  Laterality: N/A; 06/25/2017: ESOPHAGOGASTRODUODENOSCOPY (EGD) WITH PROPOFOL; N/A     Comment:  Procedure: ESOPHAGOGASTRODUODENOSCOPY (EGD) WITH               PROPOFOL;  Surgeon: Jonathon Bellows, MD;  Location[facility]               ENDOSCOPY;  Service: Endoscopy;  Laterality: N/A; 07/14/2017: ESOPHAGOGASTRODUODENOSCOPY (EGD) WITH PROPOFOL; N/A     Comment:  Procedure: ESOPHAGOGASTRODUODENOSCOPY (EGD) WITH               PROPOFOL with dilation;  Surgeon: [name], MD;                Location: [facility] ENDOSCOPY;  Service: Endoscopy;                Laterality: N/A; 08/31/2017: ESOPHAGOGASTRODUODENOSCOPY (EGD) WITH PROPOFOL; N/A     Comment:  Procedure: ESOPHAGOGASTRODUODENOSCOPY (EGD) WITH  PROPOFOL;  Surgeon: [name], MD;  [facility]               ENDOSCOPY;  Service: Endoscopy;  Laterality: N/A; 09/21/2017: ESOPHAGOGASTRODUODENOSCOPY (EGD) WITH PROPOFOL; N/A     Comment:  Procedure: ESOPHAGOGASTRODUODENOSCOPY (EGD) WITH               PROPOFOL;  Surgeon: [name], MD;  Location: [facility]               ENDOSCOPY;  Service: Endoscopy;  Laterality: N/A; No date: TONSILLECTOMY  BMI    Body Mass Index:  24.22 kg/m      Reproductive/Obstetrics negative OB ROS                             Anesthesia Physical  Anesthesia Plan  ASA: III  Anesthesia Plan: General   Post-op Pain Management:    Induction: Intravenous  PONV Risk Score and Plan: Propofol infusion, Ondansetron and Dexamethasone  Airway Management Planned: Natural Airway and Nasal Cannula  Additional Equipment:   Intra-op Plan:   Post-operative Plan:   Informed Consent: I have reviewed the patients History and Physical, chart, labs and discussed the  procedure including the risks, benefits and alternatives for the proposed anesthesia with the patient or authorized representative who has indicated his/her understanding and acceptance.   Dental Advisory Given  Plan Discussed with: Anesthesiologist, CRNA and Surgeon  Anesthesia Plan Comments: (Patient consented for risks of anesthesia including but not limited to:  - adverse reactions to medications - risk of intubation if required - damage to teeth, lips or other oral mucosa - sore throat or hoarseness - Damage to heart, brain, lungs or loss of life  Patient voiced understanding.)        Anesthesia Quick Evaluation

## 2018-03-09 NOTE — H&P (Signed)
[name], MD [address] [place] [place], [place] [address] Phone:[phone] Fax : [phone]  Primary Care Physician:  Crecencio Mc, MD Primary Gastroenterologist:  Dr. Allen Norris  Pre-Procedure History & Physical: HPI:  Brittney Jackson is a 84 y.o. female is here for an endoscopy.   Past Medical History:  Diagnosis Date  . Arthritis    left knee  . Cancer ([place])    esophageal cancer  . Diverticulitis   . Dysphagia   . Function kidney decreased   . Hypertension   . Hypothyroidism   . Inappropriate ADH secretion ([facility])   . Low sodium levels   . Osteoporosis   . Renal disorder     Past Surgical History:  Procedure Laterality Date  . CATARACT EXTRACTION W/PHACO Left 05/29/2015   Procedure: CATARACT EXTRACTION PHACO AND INTRAOCULAR LENS PLACEMENT (IOC);  Surgeon: Leandrew Koyanagi, MD;  Location: [place];  Service: Ophthalmology;  Laterality: Left;  . CATARACT EXTRACTION W/PHACO Right 01/15/2016   Procedure: CATARACT EXTRACTION PHACO AND INTRAOCULAR LENS PLACEMENT (IOC);  Surgeon: Leandrew Koyanagi, MD;  Location: [place];  Service: Ophthalmology;  Laterality: Right;  SHUGARCAINE  . ESOPHAGOGASTRODUODENOSCOPY (EGD) WITH PROPOFOL N/A 03/16/2017   Procedure: ESOPHAGOGASTRODUODENOSCOPY (EGD) WITH PROPOFOL;  Surgeon: [name], MD;  Location: [facility];  Service: Endoscopy;  Laterality: N/A;  . ESOPHAGOGASTRODUODENOSCOPY (EGD) WITH PROPOFOL N/A 06/25/2017   Procedure: ESOPHAGOGASTRODUODENOSCOPY (EGD) WITH PROPOFOL;  Surgeon: Jonathon Bellows, MD;  Location: [facility] ENDOSCOPY;  Service: Endoscopy;  Laterality: N/A;  . ESOPHAGOGASTRODUODENOSCOPY (EGD) WITH PROPOFOL N/A 07/14/2017   Procedure: ESOPHAGOGASTRODUODENOSCOPY (EGD) WITH PROPOFOL with dilation;  Surgeon: [name], MD;  Location: [facility]OS[facility];  Service: Endoscopy;  Laterality: N/A;  . ESOPHAGOGASTRODUODENOSCOPY (EGD) WITH PROPOFOL N/A 08/31/2017   Procedure: ESOPHAGOGASTRODUODENOSCOPY  (EGD) WITH PROPOFOL;  Surgeon: [name], MD;  Location: [facility]OS[facility];  Service: Endoscopy;  Laterality: N/A;  . ESOPHAGOGASTRODUODENOSCOPY (EGD) WITH PROPOFOL N/A 09/21/2017   Procedure: ESOPHAGOGASTRODUODENOSCOPY (EGD) WITH PROPOFOL;  Surgeon: [name], MD;  Location: [facility] ENDOSCOPY;  Service: Endoscopy;  Laterality: N/A;  . ESOPHAGOGASTRODUODENOSCOPY (EGD) WITH PROPOFOL N/A 10/12/2017   Procedure: ESOPHAGOGASTRODUODENOSCOPY (EGD) WITH PROPOFOL;  Surgeon: [name], MD;  Location: [facility]OS[facility];  Service: Endoscopy;  Laterality: N/A;  . ESOPHAGOGASTRODUODENOSCOPY (EGD) WITH PROPOFOL N/A 01/18/2018   Procedure: ESOPHAGOGASTRODUODENOSCOPY (EGD) WITH PROPOFOL;  Surgeon: [name], MD;  Location: [facility] ENDOSCOPY;  Service: Endoscopy;  Laterality: N/A;  . EYE SURGERY    . IR FLUORO GUIDE PORT INSERTION RIGHT  02/22/2018  . REFRACTIVE SURGERY    . TONSILLECTOMY      Prior to Admission medications   Medication Sig Start Date End Date Taking? Authorizing Provider  levothyroxine (SYNTHROID) 50 MCG tablet Take 1 tablet (50 mcg total) by mouth daily before breakfast. 01/30/18  Yes Cammie Sickle, MD  lidocaine-prilocaine (EMLA) cream Apply 1 application topically as needed. Apply generously over the Mediport 45 minutes prior to chemotherapy. 02/18/18  Yes Cammie Sickle, MD  omeprazole (PRILOSEC OTC) 20 MG tablet Take 20 mg by mouth daily. Chew tablets   Yes [provider]  ondansetron (ZOFRAN) 8 MG tablet One pill every 8 hours as needed for nausea/vomitting. Patient not taking: Reported on 02/22/2018 02/18/18   Cammie Sickle, MD  potassium chloride SA (K-DUR,KLOR-CON) 20 MEQ tablet Take 1 tablet (20 mEq total) by mouth 2 (two) times daily. Patient not taking: Reported on 02/28/2018 07/09/17   Cammie Sickle, MD  prochlorperazine (COMPAZINE) 10 MG tablet Take 1 tablet (  10 mg total) by mouth every 6 (six) hours as needed for nausea or vomiting. Patient not  taking: Reported on 02/22/2018 02/18/18   Cammie Sickle, MD  sucralfate (CARAFATE) 1 g tablet Take 1 tablet (1 g total) by mouth 4 (four) times daily -  with meals and at bedtime. Dissolve in warm water, swish and swallow Patient not taking: Reported on 02/28/2018 04/06/17   Noreene Filbert, MD    Allergies as of 03/07/2018 - Review Complete 02/23/2018  Allergen Reaction Noted  . Amlodipine Swelling 01/09/2014    Family History  Problem Relation Age of Onset  . Heart attack Mother   . Cancer Maternal Uncle        renal cancer   . COPD Neg Hx        no breast, ovarian or colon    Social History   Socioeconomic History  . Marital status: Widowed    Spouse name: Not on file  . Number of children: Not on file  . Years of education: Not on file  . Highest education level: Not on file  Social Needs  . Financial resource strain: Not on file  . Food insecurity - worry: Not on file  . Food insecurity - inability: Not on file  . Transportation needs - medical: Not on file  . Transportation needs - non-medical: Not on file  Occupational History  . 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 Smoking status: Former Smoker    Types: Cigarettes    Last attempt to quit: 07/21/2000    Years since quitting: 17.6  . Smokeless tobacco: Never Used  Substance and Sexual Activity  . Alcohol use: No  . Drug use: No  . Sexual activity: No  Other Topics Concern  . Not on file  Social History Narrative   Church activities, community service.   Lives alone.    Review of Systems: See HPI, otherwise negative ROS  Physical Exam: BP (!) 217/81   Pulse 83   Temp (!) 97.1 F (36.2 C) (Tympanic)   Resp 18   Ht 5' (1.524 m)   Wt 129 lb (58.5 kg)   SpO2 99%   BMI 25.19 kg/m  General:   Alert,  pleasant and cooperative in NAD Head:  Normocephalic and atraumatic. Neck:  Supple; no masses or thyromegaly. Lungs:  Clear throughout to auscultation.      Heart:  Regular rate and rhythm. Abdomen:  Soft, nontender and nondistended. Normal bowel sounds, without guarding, and without rebound.   Neurologic:  Alert and  oriented x4;  grossly normal neurologically.  Impression/Plan: [name] is here for an endoscopy to be performed for esophageal stricture  Risks, benefits, limitations, and alternatives regarding  endoscopy have been reviewed with the patient.  Questions have been answered.  All parties agreeable.   [name], MD  03/09/2018, 10:42 AM

## 2018-03-09 NOTE — Op Note (Signed)
[facility] Patient Name: Brittney Jackson Procedure Date: 03/09/2018 10:34 AM MRN: [id number] Account #: [id number] Date of Birth: Sep 27, 1930 Admit Type: Outpatient Age: 84 Room: [facility] ENDO ROOM 1 Gender: Female Note Status: Finalized Procedure:            Upper GI endoscopy Indications:          Dysphagia, Malignant esophageal adenocarcinoma Providers:            [name] MD, MD Medicines:            Propofol per Anesthesia Complications:        No immediate complications. Procedure:            Pre-Anesthesia Assessment:                       - Prior to the procedure, a History and Physical was                        performed, and patient medications and allergies were                        reviewed. The patient's tolerance of previous                        anesthesia was also reviewed. The risks and benefits of                        the procedure and the sedation options and risks were                        discussed with the patient. All questions were                        answered, and informed consent was obtained. Prior                        Anticoagulants: The patient has taken no previous                        anticoagulant or antiplatelet agents. ASA Grade                        Assessment: II - A patient with mild systemic disease.                        After reviewing the risks and benefits, the patient was                        deemed in satisfactory condition to undergo the                        procedure.                       After obtaining informed consent, the endoscope was                        passed under direct vision. Throughout the procedure,                        the patient's blood  pressure, pulse, and oxygen                        saturations were monitored continuously. The Endoscope                        was introduced through the mouth, and advanced to the                        second part of  duodenum. The upper GI endoscopy was                        accomplished without difficulty. The patient tolerated                        the procedure well. Findings:      Barrett's esophagus was present in the lower third of the esophagus.      A medium-sized hiatal hernia was present.      One severe stenosis was found. This measured 8 mm (inner diameter) and       was traversed after dilation. A TTS dilator was passed through the       scope. Dilation with a 12-13.5-15 mm balloon dilator was performed to 15       mm.      The stomach was normal.      The examined duodenum was normal. Impression:           - Barrett's esophagus.                       - Medium-sized hiatal hernia.                       - Esophageal stenosis. Dilated.                       - Normal stomach.                       - Normal examined duodenum.                       - No specimens collected. Recommendation:       - Discharge patient to home.                       - Resume previous diet.                       - Continue present medications.                       - Repeat upper endoscopy PRN for retreatment. Procedure Code(s):    --- Professional ---                       [phone], Esophagogastroduodenoscopy, flexible, transoral;                        with transendoscopic balloon dilation of esophagus                        (less than 30 mm diameter) Diagnosis Code(s):    --- Professional ---  R13.10, Dysphagia, unspecified                       K22.2, Esophageal obstruction                       C15.9, Malignant neoplasm of esophagus, unspecified CPT copyright 2016 American Medical Association. All rights reserved. The codes documented in this report are preliminary and upon coder review may  be revised to meet current compliance requirements. [name] MD, MD 03/09/2018 10:59:31 AM This report has been signed electronically. Number of Addenda: 0 Note Initiated On: 03/09/2018 10:34  AM      [facility]

## 2018-03-10 ENCOUNTER — Encounter: Payer: Self-pay | Admitting: Gastroenterology

## 2018-03-14 ENCOUNTER — Inpatient Hospital Stay: Payer: Medicare Other

## 2018-03-14 ENCOUNTER — Inpatient Hospital Stay (HOSPITAL_BASED_OUTPATIENT_CLINIC_OR_DEPARTMENT_OTHER): Payer: Medicare Other | Admitting: Internal Medicine

## 2018-03-14 DIAGNOSIS — E039 Hypothyroidism, unspecified: Secondary | ICD-10-CM

## 2018-03-14 DIAGNOSIS — C7989 Secondary malignant neoplasm of other specified sites: Secondary | ICD-10-CM

## 2018-03-14 DIAGNOSIS — N289 Disorder of kidney and ureter, unspecified: Secondary | ICD-10-CM

## 2018-03-14 DIAGNOSIS — R911 Solitary pulmonary nodule: Secondary | ICD-10-CM

## 2018-03-14 DIAGNOSIS — I10 Essential (primary) hypertension: Secondary | ICD-10-CM

## 2018-03-14 DIAGNOSIS — C155 Malignant neoplasm of lower third of esophagus: Secondary | ICD-10-CM | POA: Diagnosis not present

## 2018-03-14 DIAGNOSIS — Z5111 Encounter for antineoplastic chemotherapy: Secondary | ICD-10-CM | POA: Diagnosis not present

## 2018-03-14 LAB — COMPREHENSIVE METABOLIC PANEL
ALBUMIN: 3.7 g/dL (ref 3.5–5.0)
ALK PHOS: 149 U/L — AB (ref 38–126)
ALT: 12 U/L — AB (ref 14–54)
AST: 24 U/L (ref 15–41)
Anion gap: 12 (ref 5–15)
BILIRUBIN TOTAL: 0.4 mg/dL (ref 0.3–1.2)
BUN: 34 mg/dL — ABNORMAL HIGH (ref 6–20)
CO2: 22 mmol/L (ref 22–32)
CREATININE: 1.01 mg/dL — AB (ref 0.44–1.00)
Calcium: 9.1 mg/dL (ref 8.9–10.3)
Chloride: 104 mmol/L (ref 101–111)
GFR calc Af Amer: 57 mL/min — ABNORMAL LOW (ref >60)
GFR calc non Af Amer: 49 mL/min — ABNORMAL LOW (ref >60)
GLUCOSE: 90 mg/dL (ref 65–99)
POTASSIUM: 4.2 mmol/L (ref 3.5–5.1)
Sodium: 138 mmol/L (ref 135–145)
TOTAL PROTEIN: 7.3 g/dL (ref 6.5–8.1)

## 2018-03-14 LAB — CBC WITH DIFFERENTIAL/PLATELET
BASOS ABS: 0.1 10*3/uL (ref 0–0.1)
Basophils Relative: 1 %
EOS PCT: 3 %
Eosinophils Absolute: 0.2 10*3/uL (ref 0–0.7)
HCT: 36.9 % (ref 35.0–47.0)
Hemoglobin: 12.7 g/dL (ref 12.0–16.0)
LYMPHS PCT: 25 %
Lymphs Abs: 1.5 10*3/uL (ref 1.0–3.6)
MCH: 29.2 pg (ref 26.0–34.0)
MCHC: 34.3 g/dL (ref 32.0–36.0)
MCV: 85.1 fL (ref 80.0–100.0)
Monocytes Absolute: 0.9 10*3/uL (ref 0.2–0.9)
Monocytes Relative: 15 %
Neutro Abs: 3.3 10*3/uL (ref 1.4–6.5)
Neutrophils Relative %: 56 %
PLATELETS: 238 10*3/uL (ref 150–440)
RBC: 4.34 MIL/uL (ref 3.80–5.20)
RDW: 14.5 % (ref 11.5–14.5)
WBC: 6 10*3/uL (ref 3.6–11.0)

## 2018-03-14 MED ORDER — SODIUM CHLORIDE 0.9 % IV SOLN: Freq: Once | INTRAVENOUS | Status: DC

## 2018-03-14 MED ORDER — DEXTROSE 5 % IV SOLN
Freq: Once | INTRAVENOUS | Status: AC
Start: 2018-03-14 — End: 2018-03-14
  Administered 2018-03-14: 11:00:00 via INTRAVENOUS
  Filled 2018-03-14: qty 1000

## 2018-03-14 MED ORDER — SODIUM CHLORIDE 0.9 % IV SOLN
2400.0000 mg/m2 | INTRAVENOUS | Status: DC
  Administered 2018-03-14: 3750 mg via INTRAVENOUS
  Filled 2018-03-14: qty 75

## 2018-03-14 MED ORDER — SODIUM CHLORIDE 0.9% FLUSH
10.0000 mL | Freq: Once | INTRAVENOUS | Status: AC
  Administered 2018-03-14: 10 mL via INTRAVENOUS
  Filled 2018-03-14: qty 10

## 2018-03-14 MED ORDER — LEUCOVORIN CALCIUM INJECTION 350 MG: 400.0000 mg/m2 | Freq: Once | INTRAVENOUS | Status: DC

## 2018-03-14 MED ORDER — ONDANSETRON HCL 4 MG/2ML IJ SOLN
8.0000 mg | Freq: Once | INTRAMUSCULAR | Status: AC
  Administered 2018-03-14: 8 mg via INTRAVENOUS
  Filled 2018-03-14: qty 4

## 2018-03-14 MED ORDER — HEPARIN SOD (PORK) LOCK FLUSH 100 UNIT/ML IV SOLN: 500.0000 [IU] | Freq: Once | INTRAVENOUS | Status: DC

## 2018-03-14 MED ORDER — LEUCOVORIN CALCIUM INJECTION 100 MG
20.0000 mg/m2 | Freq: Once | INTRAMUSCULAR | Status: AC
  Administered 2018-03-14: 32 mg via INTRAVENOUS
  Filled 2018-03-14: qty 1.6

## 2018-03-14 NOTE — Assessment & Plan Note (Addendum)
#  Recurrent/metastatic adenocarcinoma of the esophagus- on s/p cycle #4 [place]; February 2019 PET scan progression-subcarinal lymph node progression; also left scapular/glenoid uptake.  # On 5FU pump every 2 weeks/ s/p cycle #1.  Tolerating chemotherapy extremely well.  # proceed with cycle #2 today; Labs today reviewed;  acceptable for treatment today.   # Left shoulder met- s/p RT improved [March 11th]  # Difficulty swallowing - second malignancy/radiation- status post dilatation x 4; improved [last macrh 2019  #Chronic hypertension- elevated in 190sss; as per patient at home currently blood pressure is 130s to 140s.  # Left lower lobe centimeter lung nodule- question etiology. Stable.  # Left renal mass- suspicious for malignancy.  Stable on most recent PET scan.  # Hypothyroidism- slightly low TSH-recently Synthroid adjusted to 50 mcg.   Awaiting on Thyroid profile.  # follow up in 2 weeks/labs/  Cc; Dr.Tullo/ [place]/Dr.Lateef.

## 2018-03-14 NOTE — Progress Notes (Signed)
[place] NOTE  Patient Care Team: Crecencio Mc, MD as PCP - General (Internal Medicine) Clent Jacks, RN as Registered Nurse Anthonette Legato, MD (Internal Medicine)  CHIEF COMPLAINTS/PURPOSE OF CONSULTATION: Esophageal cancer  #  Oncology History   # MARCH 2018- ADENO CA; mod diff [lower third of esophagus; EGD Bx; Dr.Wohl]; CT- A/P- no distant mets/LN. PET- TxN1; no EUS.  # April 5th 2018- Carbo-taxol with RT [until May 22nd 2018]; July 12h PET-improved.   # NOV 5th PET- Progression in mediastinum; EUS Bx- METASTATIC ADENO CA [Dr.Jowell; [facility]]  # NOV 30th 2018- Keytruda; FEB 20th 2019- PET progressive subcarinal lymph node; and also left scapular/glenoid uptake  # march 1st week- 5-FU CIV q2 W   # Dysphagia- s/p EGD [Dr. Vicente Males; July 2018]; Repeat EGD [Dr.Wohl] s/p dilatation.  # March 2018- LEFT KIDNEY COMPLEX MASS ~2cm [incidental]; ~ 2 cm left lower lobe nodule [question second primary]  # Hx of Hyponatremia [~[phone]; Dr.Lateef]  # MOLECULAR TESTING- Her 2 neu-NEGATIVE; PDL-1- POSITIVE [CPS-2]; MMR- STABLE.      Malignant neoplasm of lower third of esophagus (HCC)     HISTORY OF PRESENTING ILLNESS:  Brittney Jackson 84 y.o.  female metastatic/recurrent esophageal adenocarcinoma currently on 5-FU every 2 weeks is here for follow-up.  The interim patient underwent a radiation x1 to the left scapula approximately 1 week ago.  Patient notes to have improvement of the left scapular discomfort.  Patient denies any nausea vomiting.  Denies any diarrhea.  Denies any head loss.  In the interim patient had a EGD with stretching of the esophagus.  Patient denies any pain anywhere.  No worsening difficulty swallowing.  ROS: A complete 10 point review of system is done which is negative except mentioned above in history of present illness  MEDICAL HISTORY:  Past Medical History:  Diagnosis Date  . Arthritis    left knee  . Cancer ([place])    esophageal cancer  . Diverticulitis   . Dysphagia   . Function kidney decreased   . Hypertension   . Hypothyroidism   . Inappropriate ADH secretion (HCC)   . Low sodium levels   . Osteoporosis   . Renal disorder     SURGICAL HISTORY: Past Surgical History:  Procedure Laterality Date  . CATARACT EXTRACTION W/PHACO Left 05/29/2015   Procedure: CATARACT EXTRACTION PHACO AND INTRAOCULAR LENS PLACEMENT (IOC);  Surgeon: Leandrew Koyanagi, MD;  Location: [place];  Service: Ophthalmology;  Laterality: Left;  . CATARACT EXTRACTION W/PHACO Right 01/15/2016   Procedure: CATARACT EXTRACTION PHACO AND INTRAOCULAR LENS PLACEMENT (IOC);  Surgeon: Leandrew Koyanagi, MD;  Location: [place];  Service: Ophthalmology;  Laterality: Right;  SHUGARCAINE  . ESOPHAGOGASTRODUODENOSCOPY (EGD) WITH PROPOFOL N/A 03/16/2017   Procedure: ESOPHAGOGASTRODUODENOSCOPY (EGD) WITH PROPOFOL;  Surgeon: [name], MD;  Location: [facility];  Service: Endoscopy;  Laterality: N/A;  . ESOPHAGOGASTRODUODENOSCOPY (EGD) WITH PROPOFOL N/A 06/25/2017   Procedure: ESOPHAGOGASTRODUODENOSCOPY (EGD) WITH PROPOFOL;  Surgeon: Jonathon Bellows, MD;  Location: [place] [facility];  Service: Endoscopy;  Laterality: N/A;  . ESOPHAGOGASTRODUODENOSCOPY (EGD) WITH PROPOFOL N/A 07/14/2017   Procedure: ESOPHAGOGASTRODUODENOSCOPY (EGD) WITH PROPOFOL with dilation;  Surgeon: [name], MD;  Location: [facility];  Service: Endoscopy;  Laterality: N/A;  . ESOPHAGOGASTRODUODENOSCOPY (EGD) WITH PROPOFOL N/A 08/31/2017   Procedure: ESOPHAGOGASTRODUODENOSCOPY (EGD) WITH PROPOFOL;  Surgeon: [name], MD;  Location: [facility];  Service: Endoscopy;  Laterality: N/A;  . ESOPHAGOGASTRODUODENOSCOPY (EGD) WITH PROPOFOL N/A 09/21/2017   Procedure: ESOPHAGOGASTRODUODENOSCOPY (EGD)  WITH PROPOFOL;  Surgeon: [name], MD;  Location: [facility];  Service: Endoscopy;  Laterality: N/A;  . ESOPHAGOGASTRODUODENOSCOPY (EGD) WITH  PROPOFOL N/A 10/12/2017   Procedure: ESOPHAGOGASTRODUODENOSCOPY (EGD) WITH PROPOFOL;  Surgeon: [name], MD;  Location: [facility];  Service: Endoscopy;  Laterality: N/A;  . ESOPHAGOGASTRODUODENOSCOPY (EGD) WITH PROPOFOL N/A 01/18/2018   Procedure: ESOPHAGOGASTRODUODENOSCOPY (EGD) WITH PROPOFOL;  Surgeon: [name], MD;  Location: [facility];  Service: Endoscopy;  Laterality: N/A;  . ESOPHAGOGASTRODUODENOSCOPY (EGD) WITH PROPOFOL N/A 03/09/2018   Procedure: ESOPHAGOGASTRODUODENOSCOPY (EGD) WITH PROPOFOL;  Surgeon: [name], MD;  Location: [facility];  Service: Endoscopy;  Laterality: N/A;  . EYE SURGERY    . IR FLUORO GUIDE PORT INSERTION RIGHT  02/22/2018  . REFRACTIVE SURGERY    . 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 Family is in the area.  Social History   Socioeconomic History  . Marital status: Widowed    Spouse name: Not on file  . Number of children: Not on file  . Years of education: Not on file  . Highest education level: Not on file  Social Needs  . Financial resource strain: Not on file  . Food insecurity - worry: Not on file  . Food insecurity - inability: Not on file  . Transportation needs - medical: Not on file  . Transportation needs - non-medical: Not on file  Occupational History  . 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 Smoking status: Former Smoker    Types: Cigarettes    Last attempt to quit: 07/21/2000    Years since quitting: 17.6  . Smokeless tobacco: Never Used  Substance and Sexual Activity  . Alcohol use: No  . Drug use: No  . Sexual activity: No  Other Topics Concern  . Not on file  Social History Narrative   Church activities, community service.   Lives alone.    FAMILY HISTORY: Family History  Problem Relation Age of Onset  . Heart attack Mother   . Cancer Maternal Uncle        renal cancer   . COPD Neg Hx         no breast, ovarian or colon    ALLERGIES:  is allergic to amlodipine.  MEDICATIONS:  Current Outpatient Medications  Medication Sig Dispense Refill  . levothyroxine (SYNTHROID) 50 MCG tablet Take 1 tablet (50 mcg total) by mouth daily before breakfast. 30 tablet 3  . lidocaine-prilocaine (EMLA) cream Apply 1 application topically as needed. Apply generously over the Mediport 45 minutes prior to chemotherapy. 30 g 0  . omeprazole (PRILOSEC OTC) 20 MG tablet Take 20 mg by mouth daily. Chew tablets    . ondansetron (ZOFRAN) 8 MG tablet One pill every 8 hours as needed for nausea/vomitting. (Patient not taking: Reported on 02/22/2018) 40 tablet 1  . potassium chloride SA (K-DUR,KLOR-CON) 20 MEQ tablet Take 1 tablet (20 mEq total) by mouth 2 (two) times daily. (Patient not taking: Reported on 02/28/2018) 30 tablet 3  . prochlorperazine (COMPAZINE) 10 MG tablet Take 1 tablet (10 mg total) by mouth every 6 (six) hours as needed for nausea or vomiting. (Patient not taking: Reported on 02/22/2018) 40 tablet 1  . sucralfate (CARAFATE) 1 g tablet Take 1 tablet (1 g total) by mouth 4 (four) times daily -  with meals and at bedtime. Dissolve in warm water, swish and swallow (Patient not taking: Reported on 02/28/2018)  90 tablet 3   No current facility-administered medications for this visit.       Marland Kitchen  PHYSICAL EXAMINATION: ECOG PERFORMANCE STATUS: 0 - Asymptomatic  Vitals:   03/14/18 1007  BP: (!) 194/103  Pulse: 94  Resp: 16  Temp: 97.8 F (36.6 C)   Filed Weights   03/14/18 1007  Weight: 129 lb 12.8 oz (58.9 kg)    GENERAL: Well-nourished well-developed; Alert, no distress and comfortable. She is accompanied by daughter. EYES: no pallor or icterus OROPHARYNX: no thrush or ulceration; good dentition  NECK: supple, no masses felt LYMPH:  no palpable lymphadenopathy in the cervical, axillary or inguinal regions LUNGS: clear to auscultation and  No wheeze or crackles HEART/CVS: regular rate &  rhythm and no murmurs; no swelling bilateral lower extremities ABDOMEN: abdomen soft, non-tender and normal bowel sounds Musculoskeletal:no cyanosis of digits and no clubbing  PSYCH: alert & oriented x 3 with fluent speech NEURO: no focal motor/sensory deficits SKIN:  no rashes or significant lesions  LABORATORY DATA:  I have reviewed the data as listed Lab Results  Component Value Date   WBC 6.0 03/14/2018   HGB 12.7 03/14/2018   HCT 36.9 03/14/2018   MCV 85.1 03/14/2018   PLT 238 03/14/2018   Recent Labs    07/03/17 1532  07/05/17 1655  01/27/18 1052 02/17/18 1030 02/22/18 1503 02/28/18 1005 03/14/18 0928  NA 131*   < >  --    < > 138 138 139 134* 138  K 3.1*   < >  --    < > 3.9 3.8 4.1 3.9 4.2  CL 93*   < >  --    < > 102 103 103 103 104  CO2 29   < >  --    < > _0 GLUCOSE 120*   < >  --    < > 140* 111* 107* 134* 90  BUN 16   < >  --    < > 27* 23* 22* 23* 34*  CREATININE 0.90   < >  --    < > 0.96 1.06* 0.96 1.05* 1.01*  CALCIUM 9.1   < >  --    < > 9.1 9.3 9.5 9.0 9.1  GFRNONAA 57*   < >  --    < > 52* 47* 52* 47* 49*  GFRAA >60   < >  --    < > >60 54* >60 55* 57*  PROT 7.3  --  7.1   < > 7.3 7.5  --   --  7.3  ALBUMIN 3.5  --  3.3*   < > 3.7 3.7  --   --  3.7  AST 37  --  25   < > 27 25  --   --  24  ALT 24  --  20   < > 15 14  --   --  12*  ALKPHOS 108  --  110   < > 154* 163*  --   --  149*  BILITOT 0.6  --  0.6   < > 0.4 0.3  --   --  0.4  BILIDIR <0.1*  --  0.1  --   --   --   --   --   --   IBILI NOT CALCULATED  --  0.5  --   --   --   --   --   --    < > =  values in this interval not displayed.    RADIOGRAPHIC STUDIES: I have personally reviewed the radiological images as listed and agreed with the findings in the report. Nm Pet Image Restag (ps) Skull Base To Thigh  Result Date: 02/14/2018 CLINICAL DATA:  Subsequent treatment strategy for esophageal neoplasm. EXAM: NUCLEAR MEDICINE PET SKULL BASE TO THIGH TECHNIQUE: 12.745 mCi F-18 FDG  was injected intravenously. Full-ring PET imaging was performed from the skull base to thigh after the radiotracer. CT data was obtained and used for attenuation correction and anatomic localization. FASTING BLOOD GLUCOSE:  Value: 105 mg/dl COMPARISON:  PET-CT 11/03/2017. FINDINGS: NECK: No hypermetabolic lymph nodes in the neck. CHEST: Diffuse circumferential thickening of the distal esophagus is hypermetabolic (SUVmax = 7.2) , compatible with residual tumor. Borderline enlarged (13 mm short axis) subcarinal lymph node is markedly hypermetabolic (SUVmax = 8.7) . no other lymphadenopathy is noted in the thorax. Tiny 4 mm nodules in the right middle lobe (axial image 101 of series 3) and in the left lower lobe (image 102 of series 3), unchanged compared to the prior study. No other larger more suspicious appearing pulmonary nodules or masses. Linear scarring in the lower lobes of the lungs bilaterally. No acute consolidative airspace disease. No pleural effusions. There is aortic atherosclerosis, as well as atherosclerosis of the great vessels of the mediastinum and the coronary arteries, including calcified atherosclerotic plaque in the left main, left anterior descending, left circumflex and right coronary arteries. Mild calcifications of the aortic valve. ABDOMEN/PELVIS: No abnormal hypermetabolic activity within the liver, pancreas, adrenal glands, or spleen. No hypermetabolic lymph nodes in the abdomen or pelvis. Aortic atherosclerosis, without evidence of aneurysm throughout the abdomen and pelvis. Numerous colonic diverticulae are noted, without surrounding inflammatory changes to suggest an acute diverticulitis at this time. SKELETON: New area of sclerosis and hypermetabolism (SUVmax = 6.3) in the left glenoid, suspicious for a new metastatic lesion. IMPRESSION: 1. Increasing metabolic activity both in the primary lesion in the distal esophagus and in the borderline enlarged subcarinal lymph node when  compared to prior study from 11/03/2017. 2. New area of sclerosis and hypermetabolism in the left glenoid, suspicious for a new left scapular metastasis. 3. Aortic atherosclerosis, in addition to left main and 3 vessel coronary artery disease. 4. There are calcifications of the aortic valve. Echocardiographic correlation for evaluation of potential valvular dysfunction may be warranted if clinically indicated. 5. Colonic diverticulosis without evidence of acute diverticulitis at this time. Aortic Atherosclerosis (ICD10-I70.0). Electronically Signed   By: Vinnie Langton M.D.   On: 02/14/2018 12:24   Ir Fluoro Guide Port Insertion Right  Result Date: 02/22/2018 INDICATION: 84 year old with esophageal cancer. Port-A-Cath needed for treatment. EXAM: FLUOROSCOPIC AND ULTRASOUND GUIDED PLACEMENT OF A SUBCUTANEOUS PORT COMPARISON:  None. MEDICATIONS: Ancef 2 g; The antibiotic was administered within an appropriate time interval prior to skin puncture. ANESTHESIA/SEDATION: Versed 1.0 mg IV; Fentanyl 25 mcg IV; Moderate Sedation Time:  21 minutes The patient was continuously monitored during the procedure by the interventional radiology nurse under my direct supervision. FLUOROSCOPY TIME:  16 seconds, 2 mGy COMPLICATIONS: None immediate. PROCEDURE: The procedure, risks, benefits, and alternatives were explained to the patient. Questions regarding the procedure were encouraged and answered. The patient understands and consents to the procedure. Patient was placed supine on the interventional table. Ultrasound confirmed a patent right internal jugular vein. The right chest and neck were cleaned with a skin antiseptic and a sterile drape was placed. Maximal barrier sterile technique was utilized including  caps, mask, sterile gowns, sterile gloves, sterile drape, hand hygiene and skin antiseptic. The right neck was anesthetized with 1% lidocaine. Small incision was made in the right neck with a blade. Micropuncture set was  placed in the right internal jugular vein with ultrasound guidance. The micropuncture wire was used for measurement purposes. The right chest was anesthetized with 1% lidocaine with epinephrine. #15 blade was used to make an incision and a subcutaneous port pocket was formed. [place] was assembled. Subcutaneous tunnel was formed with a stiff tunneling device. The port catheter was brought through the subcutaneous tunnel. The port was placed in the subcutaneous pocket. The micropuncture set was exchanged for a peel-away sheath. The catheter was placed through the peel-away sheath and the tip was positioned at the superior cavoatrial junction. Catheter placement was confirmed with fluoroscopy. The port was accessed and flushed with heparinized saline. The port pocket was closed using two layers of absorbable sutures and Dermabond. The vein skin site was closed using a single layer of absorbable suture and Dermabond. Sterile dressings were applied. Patient tolerated the procedure well without an immediate complication. Ultrasound and fluoroscopic images were taken and saved for this procedure. IMPRESSION: Placement of a subcutaneous CT injectable port device. Electronically Signed   By: Markus Daft M.D.   On: 02/22/2018 17:40    ASSESSMENT & PLAN:   Malignant neoplasm of lower third of esophagus (HCC) #Recurrent/metastatic adenocarcinoma of the esophagus- on s/p cycle #4 Beryle Flock; February 2019 PET scan progression-subcarinal lymph node progression; also left scapular/glenoid uptake.  # On 5FU pump every 2 weeks/ s/p cycle #1.  Tolerating chemotherapy extremely well.  # proceed with cycle #2 today; Labs today reviewed;  acceptable for treatment today.   # Left shoulder met- s/p RT improved [March 11th]  # Difficulty swallowing - second malignancy/radiation- status post dilatation x 4; improved [last macrh 2019  #Chronic hypertension- elevated in 190sss; as per patient at home currently blood  pressure is 130s to 140s.  # Left lower lobe centimeter lung nodule- question etiology. Stable.  # Left renal mass- suspicious for malignancy.  Stable on most recent PET scan.  # Hypothyroidism- slightly low TSH-recently Synthroid adjusted to 50 mcg.   Awaiting on Thyroid profile.  # follow up in 2 weeks/labs/  Cc; Dr.Tullo/ Wohl/Dr.Lateef.       Cammie Sickle, MD 03/14/2018 6:06 PM

## 2018-03-15 LAB — THYROID PANEL WITH TSH
Free Thyroxine Index: 1.6 (ref 1.2–4.9)
T3 UPTAKE RATIO: 23 % — AB (ref 24–39)
T4 TOTAL: 7 ug/dL (ref 4.5–12.0)
TSH: 25.12 u[IU]/mL — AB (ref 0.450–4.500)

## 2018-03-16 ENCOUNTER — Telehealth: Payer: Self-pay | Admitting: Internal Medicine

## 2018-03-16 ENCOUNTER — Telehealth: Payer: Self-pay | Admitting: *Deleted

## 2018-03-16 ENCOUNTER — Inpatient Hospital Stay: Payer: Medicare Other

## 2018-03-16 VITALS — BP 184/91 | Temp 98.2°F | Resp 17

## 2018-03-16 DIAGNOSIS — C155 Malignant neoplasm of lower third of esophagus: Secondary | ICD-10-CM | POA: Diagnosis not present

## 2018-03-16 DIAGNOSIS — Z5111 Encounter for antineoplastic chemotherapy: Secondary | ICD-10-CM | POA: Diagnosis not present

## 2018-03-16 DIAGNOSIS — C7989 Secondary malignant neoplasm of other specified sites: Secondary | ICD-10-CM | POA: Diagnosis not present

## 2018-03-16 MED ORDER — HEPARIN SOD (PORK) LOCK FLUSH 100 UNIT/ML IV SOLN
INTRAVENOUS | Status: AC
Start: 2018-03-16 — End: 2018-03-16
  Filled 2018-03-16: qty 5

## 2018-03-16 MED ORDER — SODIUM CHLORIDE 0.9% FLUSH
10.0000 mL | INTRAVENOUS | Status: DC | PRN
Start: 2018-03-16 — End: 2018-03-16
  Filled 2018-03-16: qty 10

## 2018-03-16 MED ORDER — HEPARIN SOD (PORK) LOCK FLUSH 100 UNIT/ML IV SOLN
500.0000 [IU] | Freq: Once | INTRAVENOUS | Status: AC | PRN
  Administered 2018-03-16: 500 [IU]

## 2018-03-16 NOTE — Telephone Encounter (Signed)
Spoke to patient's daughter via telephone about increased TSH levels and Dr. Aletha Halim recommendation to go back on the higher dose of Synthroid which is 88 mcg daily. Patient's daughter stated that she does still have the 88 mcg prescription on hand and she will make sure her mother starts taking.     dhs

## 2018-03-16 NOTE — Telephone Encounter (Signed)
[place]- Please inform patient that her thyroid/TSH is abnormal- 25 [likely sec to her recent opdivo use]; recommend going back to older dose of synthroid 88 mcg/day; or if she has none left- prescribe synthoird 88 mcg/day; # 30;  3 refills.   Thanks GB

## 2018-03-22 ENCOUNTER — Telehealth: Payer: Self-pay | Admitting: Internal Medicine

## 2018-03-28 ENCOUNTER — Ambulatory Visit: Payer: Medicare Other

## 2018-03-28 ENCOUNTER — Other Ambulatory Visit: Payer: Medicare Other

## 2018-03-28 ENCOUNTER — Ambulatory Visit: Payer: Medicare Other | Admitting: Internal Medicine

## 2018-03-28 NOTE — Telephone Encounter (Signed)
Thank you for letting me know

## 2018-03-28 NOTE — Telephone Encounter (Signed)
Got a call from EMT last night at around 915-that patient was found dead [possible aspiration as per EMT] at home by family.  Funeral home to send death certificate to be signed to [facility].  Please cancel all appointments  Dr.Tullo-FYI.

## 2018-03-28 DEATH — deceased

## 2018-03-30 ENCOUNTER — Ambulatory Visit: Payer: Medicare Other | Admitting: Internal Medicine

## 2018-04-18 ENCOUNTER — Ambulatory Visit: Payer: Medicare Other | Admitting: Radiation Oncology

## 2018-04-24 IMAGING — DX DG CHEST 1V PORT
1 series · 1 of 1 positions shown · non-contrast
Comparison: PET-CT March 24, 2017; chest radiograph December 31, 2013

CLINICAL DATA: Cough and shortness of breath.  Esophageal carcinoma

EXAM:
PORTABLE CHEST 1 VIEW

[chest ap]
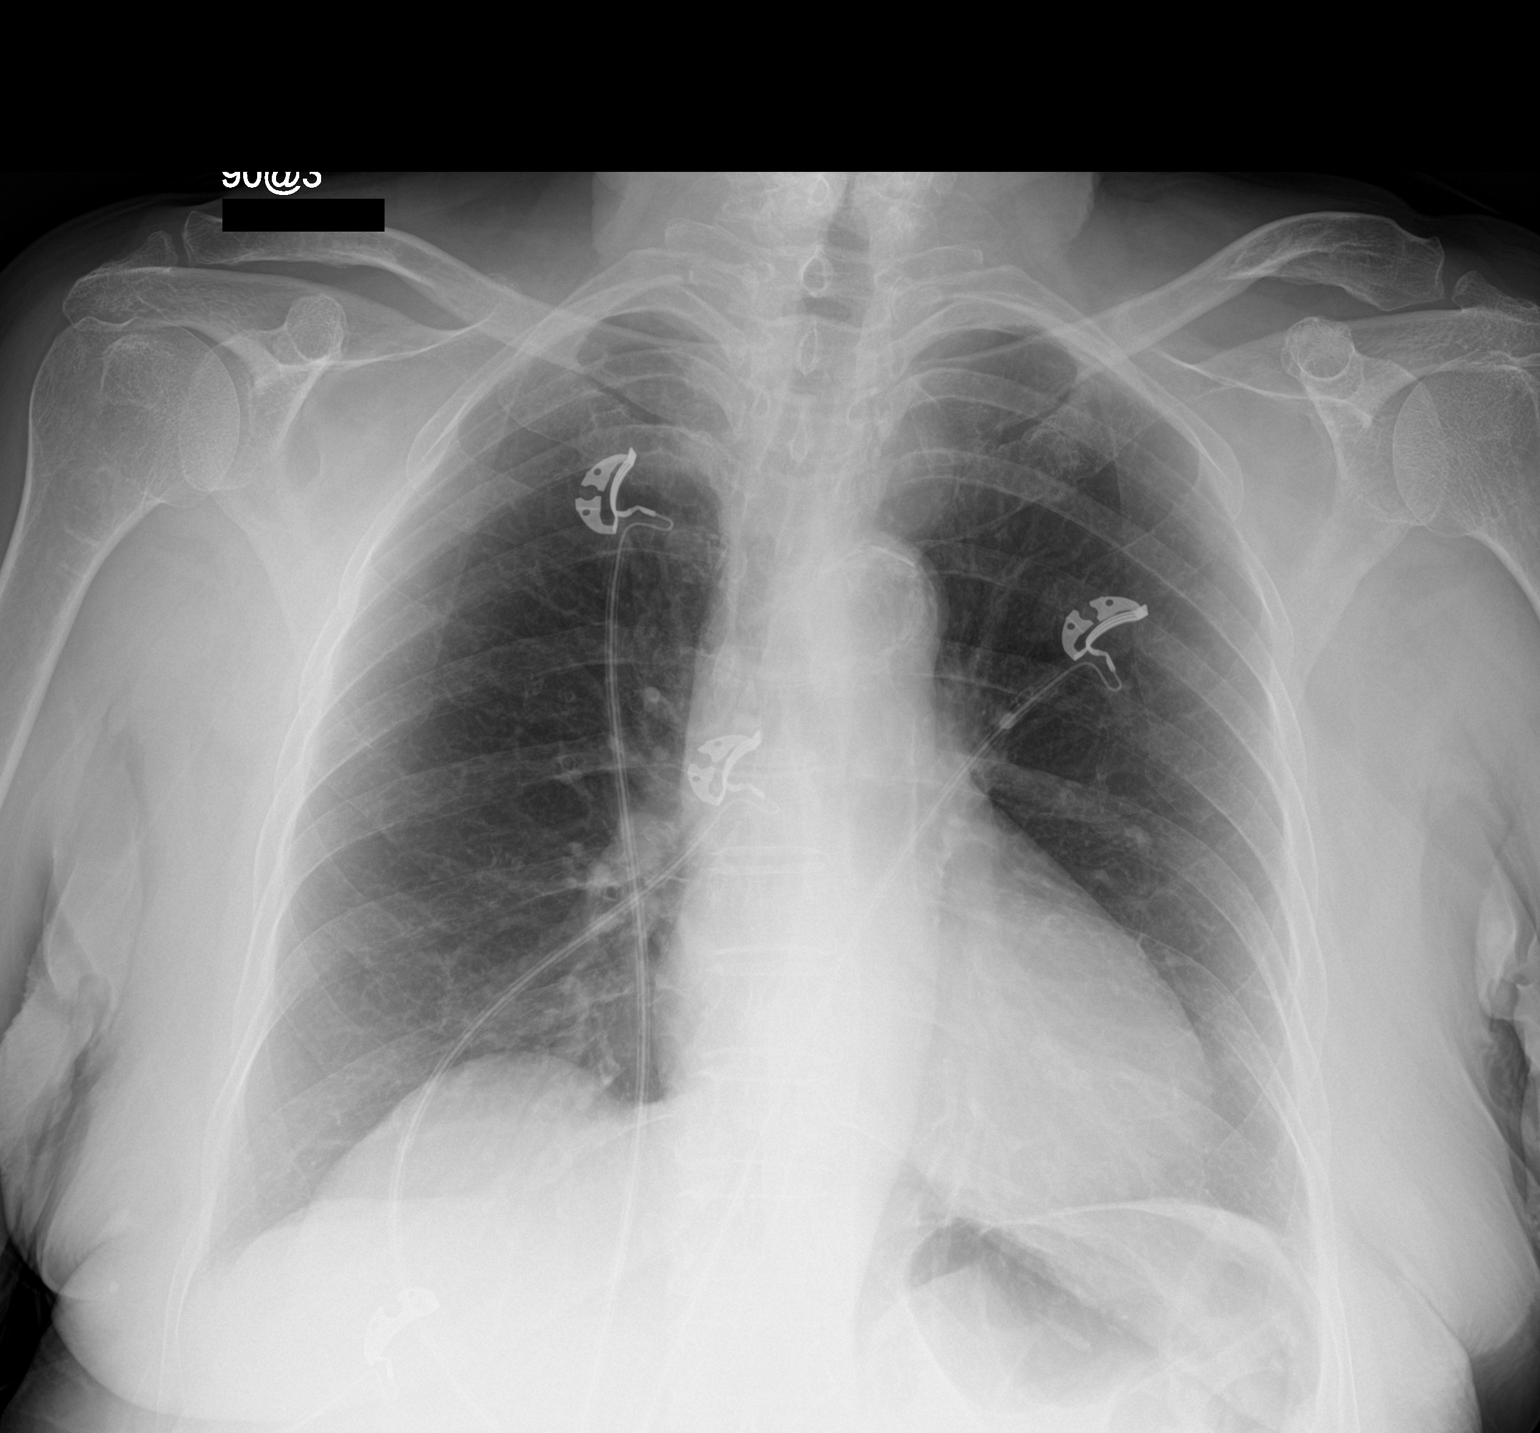

[1 of 1 positions shown; findings below may reference images not displayed]

FINDINGS: There is no edema or consolidation. Heart size is upper normal with
pulmonary vascularity within normal limits. No adenopathy evident.
There is aortic atherosclerosis. There is calcification in each
carotid artery. There is no obvious esophageal thickening by
radiography. No bone lesions.
IMPRESSION: No edema or consolidation. No evident adenopathy. Aortic
atherosclerosis. There is carotid artery calcification bilaterally.

Aortic Atherosclerosis ([id number]).

## 2018-04-28 IMAGING — PT NM PET TUM IMG RESTAG (PS) SKULL BASE T - THIGH
1 of 10 series · 1 of 25 positions shown · non-contrast
Comparison: Multiple exams, including 03/24/2017

CLINICAL DATA: Subsequent treatment strategy for esophageal cancer.

EXAM:
NUCLEAR MEDICINE PET SKULL BASE TO THIGH
TECHNIQUE: 12.6 mCi F-18 FDG was injected intravenously. Full-ring PET imaging
was performed from the skull base to thigh after the radiotracer. CT
data was obtained and used for attenuation correction and anatomic
localization.
FASTING BLOOD GLUCOSE:  Value: 100 mg/dl

[Series 4: ct wb 5.0 b30f · axial · 5.0mm · 0.98mm/px · 1 of 251 slices shown]
[im 251/251  brain]
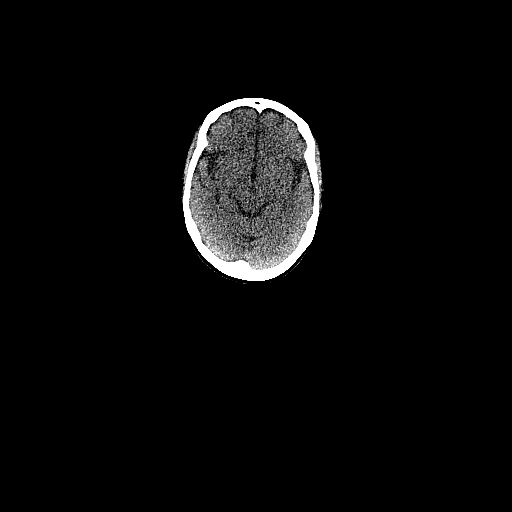

[1 of 25 positions shown; findings below may reference images not displayed]

FINDINGS: NECK

No hypermetabolic lymph nodes in the neck.

Bilateral carotid atherosclerotic calcification.

CHEST

The distal esophageal mass has a maximum standard uptake value of
5.8 (formerly 8.3). Slight indistinctness of adjacent tissue planes
or small adjacent lymph nodes, not appreciably changed.

Coronary, aortic arch, and branch vessel atherosclerotic vascular
disease. Mild biapical pleuroparenchymal scarring. Old granulomatous
disease. Calcified granuloma in the right middle lobe. Mild scarring
in the right lower lobe. Mild scarring in the lingula.

Curvilinear left lower lobe nodule, 0.8 by 0.5 cm, formerly 0.8 by
0.9 cm, no current appreciable metabolic activity.

ABDOMEN/PELVIS

No abnormal hypermetabolic activity within the liver, pancreas,
adrenal glands, or spleen. No hypermetabolic lymph nodes in the
abdomen or pelvis.

The patient has a known enhancing mass in the left mid kidney which
based on diagnostic postcontrast CT images would be considered
compatible with renal cell carcinoma. The lesion has metabolic
activity similar to the rest of the kidney and is accordingly very
inconspicuous on PET-CT.

Physiologic activity in bowel. Vicarious excretion of contrast from
recent CT scan into the gallbladder.

Aortoiliac atherosclerotic vascular disease. Sigmoid colon
diverticulosis. Mild wall thickening along the sigmoid colon could
reflect diverticulitis, and there is new accentuated activity in the
vicinity of this wall thickening with maximum SUV of 2 9.0. This
region of the bowel was not hypermetabolic on prior PET-CT from
03/24/2017 and accordingly inflammatory etiology is favored over
neoplastic.

SKELETON

No focal hypermetabolic activity to suggest skeletal metastasis.
IMPRESSION: 1. Reduced activity in the distal esophageal mass, previous 8.3,
current maximum SUV 5.8. Slightly indistinct margins with stranding
or tiny adjacent paraesophageal lymph nodes, morphology similar to
prior.
2. The patient has a known enhancing mass in the left mid kidney,
very indistinct on PET-CT given that the metabolic activity of this
lesion is similar to that of the surrounding kidney. Probably a
renal cell carcinoma.
3. Sigmoid diverticulitis, with some new accentuated metabolic
activity in the vicinity of this inflammation.
4. The curving left lower lobe nodule is reduced in size compared to
the prior exam. Surveillance likely warranted.
5.  Aortic Atherosclerosis ([id number]).  Coronary atherosclerosis.
